# Patient Record
Sex: Female | Born: 1950 | Race: White | Hispanic: No | Marital: Married | Smoking: Former smoker
Health system: Southern US, Community
[De-identification: ages and names within clinical notes are randomized; demographics above are authoritative.]

## PROBLEM LIST (undated history)

## (undated) DIAGNOSIS — F419 Anxiety disorder, unspecified: Secondary | ICD-10-CM

## (undated) DIAGNOSIS — F329 Major depressive disorder, single episode, unspecified: Secondary | ICD-10-CM

## (undated) DIAGNOSIS — E785 Hyperlipidemia, unspecified: Secondary | ICD-10-CM

## (undated) DIAGNOSIS — K219 Gastro-esophageal reflux disease without esophagitis: Secondary | ICD-10-CM

## (undated) DIAGNOSIS — M1990 Unspecified osteoarthritis, unspecified site: Secondary | ICD-10-CM

## (undated) DIAGNOSIS — F32A Depression, unspecified: Secondary | ICD-10-CM

## (undated) HISTORY — DX: Major depressive disorder, single episode, unspecified: F32.9

## (undated) HISTORY — DX: Depression, unspecified: F32.A

## (undated) HISTORY — DX: Hyperlipidemia, unspecified: E78.5

## (undated) HISTORY — PX: APPENDECTOMY: SHX54

## (undated) HISTORY — PX: CATARACT EXTRACTION: SUR2

---

## 2015-11-18 ENCOUNTER — Telehealth: Payer: Self-pay | Admitting: Physician Assistant

## 2015-11-19 NOTE — Telephone Encounter (Signed)
yes

## 2015-11-19 NOTE — Telephone Encounter (Signed)
Spoke with patient and she went ahead and filled for the month. Patient scheduled an appointment for 12/06/15 @ 8:10am with Lawanna KobusAngel for medication refill.

## 2015-11-19 NOTE — Telephone Encounter (Signed)
Do you think we can do the prior authorization on her zolpidem?

## 2015-12-06 ENCOUNTER — Encounter: Payer: Self-pay | Admitting: Physician Assistant

## 2015-12-06 ENCOUNTER — Encounter (INDEPENDENT_AMBULATORY_CARE_PROVIDER_SITE_OTHER): Payer: Self-pay

## 2015-12-06 ENCOUNTER — Ambulatory Visit (INDEPENDENT_AMBULATORY_CARE_PROVIDER_SITE_OTHER): Payer: Medicare Other | Admitting: Physician Assistant

## 2015-12-06 VITALS — BP 116/70 | HR 83 | Temp 97.6°F | Ht 61.5 in | Wt 194.0 lb

## 2015-12-06 DIAGNOSIS — IMO0001 Reserved for inherently not codable concepts without codable children: Secondary | ICD-10-CM

## 2015-12-06 DIAGNOSIS — F329 Major depressive disorder, single episode, unspecified: Secondary | ICD-10-CM

## 2015-12-06 DIAGNOSIS — M9901 Segmental and somatic dysfunction of cervical region: Secondary | ICD-10-CM

## 2015-12-06 DIAGNOSIS — Z6836 Body mass index (BMI) 36.0-36.9, adult: Secondary | ICD-10-CM | POA: Diagnosis not present

## 2015-12-06 DIAGNOSIS — E785 Hyperlipidemia, unspecified: Secondary | ICD-10-CM | POA: Diagnosis not present

## 2015-12-06 DIAGNOSIS — G4700 Insomnia, unspecified: Secondary | ICD-10-CM

## 2015-12-06 DIAGNOSIS — Z23 Encounter for immunization: Secondary | ICD-10-CM

## 2015-12-06 DIAGNOSIS — E669 Obesity, unspecified: Secondary | ICD-10-CM | POA: Insufficient documentation

## 2015-12-06 DIAGNOSIS — F32A Depression, unspecified: Secondary | ICD-10-CM

## 2015-12-06 MED ORDER — ATORVASTATIN CALCIUM 40 MG PO TABS: 40.0000 mg | ORAL_TABLET | Freq: Every day | ORAL | 11 refills | Status: DC

## 2015-12-06 MED ORDER — ZOLPIDEM TARTRATE 10 MG PO TABS: 10.0000 mg | ORAL_TABLET | Freq: Every evening | ORAL | 5 refills | Status: DC | PRN

## 2015-12-06 MED ORDER — PHENTERMINE HCL 37.5 MG PO CAPS: 37.5000 mg | ORAL_CAPSULE | ORAL | 1 refills | Status: DC

## 2015-12-06 MED ORDER — VENLAFAXINE HCL ER 75 MG PO CP24: 75.0000 mg | ORAL_CAPSULE | Freq: Every day | ORAL | 11 refills | Status: DC

## 2015-12-06 NOTE — Patient Instructions (Signed)
Cervical Sprain  A cervical sprain is an injury in the neck in which the strong, fibrous tissues (ligaments) that connect your neck bones stretch or tear. Cervical sprains can range from mild to severe. Severe cervical sprains can cause the neck vertebrae to be unstable. This can lead to damage of the spinal cord and can result in serious nervous system problems. The amount of time it takes for a cervical sprain to get better depends on the cause and extent of the injury. Most cervical sprains heal in 1 to 3 weeks.  CAUSES   Severe cervical sprains may be caused by:    Contact sport injuries (such as from football, rugby, wrestling, hockey, auto racing, gymnastics, diving, martial arts, or boxing).    Motor vehicle collisions.    Whiplash injuries. This is an injury from a sudden forward and backward whipping movement of the head and neck.   Falls.   Mild cervical sprains may be caused by:    Being in an awkward position, such as while cradling a telephone between your ear and shoulder.    Sitting in a chair that does not offer proper support.    Working at a poorly designed computer station.    Looking up or down for long periods of time.   SYMPTOMS    Pain, soreness, stiffness, or a burning sensation in the front, back, or sides of the neck. This discomfort may develop immediately after the injury or slowly, 24 hours or more after the injury.    Pain or tenderness directly in the middle of the back of the neck.    Shoulder or upper back pain.    Limited ability to move the neck.    Headache.    Dizziness.    Weakness, numbness, or tingling in the hands or arms.    Muscle spasms.    Difficulty swallowing or chewing.    Tenderness and swelling of the neck.   DIAGNOSIS   Most of the time your health care provider can diagnose a cervical sprain by taking your history and doing a physical exam. Your health care provider will ask about previous neck injuries and any known neck  problems, such as arthritis in the neck. X-rays may be taken to find out if there are any other problems, such as with the bones of the neck. Other tests, such as a CT scan or MRI, may also be needed.   TREATMENT   Treatment depends on the severity of the cervical sprain. Mild sprains can be treated with rest, keeping the neck in place (immobilization), and pain medicines. Severe cervical sprains are immediately immobilized. Further treatment is done to help with pain, muscle spasms, and other symptoms and may include:   Medicines, such as pain relievers, numbing medicines, or muscle relaxants.    Physical therapy. This may involve stretching exercises, strengthening exercises, and posture training. Exercises and improved posture can help stabilize the neck, strengthen muscles, and help stop symptoms from returning.   HOME CARE INSTRUCTIONS    Put ice on the injured area.     Put ice in a plastic bag.     Place a towel between your skin and the bag.     Leave the ice on for 15-20 minutes, 3-4 times a day.    If your injury was severe, you may have been given a cervical collar to wear. A cervical collar is a two-piece collar designed to keep your neck from moving while it heals.      Do not remove the collar unless instructed by your health care provider.    If you have long hair, keep it outside of the collar.    Ask your health care provider before making any adjustments to your collar. Minor adjustments may be required over time to improve comfort and reduce pressure on your chin or on the back of your head.    Ifyou are allowed to remove the collar for cleaning or bathing, follow your health care provider's instructions on how to do so safely.    Keep your collar clean by wiping it with mild soap and water and drying it completely. If the collar you have been given includes removable pads, remove them every 1-2 days and hand wash them with soap and water. Allow them to air dry. They should be completely  dry before you wear them in the collar.    If you are allowed to remove the collar for cleaning and bathing, wash and dry the skin of your neck. Check your skin for irritation or sores. If you see any, tell your health care provider.    Do not drive while wearing the collar.    Only take over-the-counter or prescription medicines for pain, discomfort, or fever as directed by your health care provider.    Keep all follow-up appointments as directed by your health care provider.    Keep all physical therapy appointments as directed by your health care provider.    Make any needed adjustments to your workstation to promote good posture.    Avoid positions and activities that make your symptoms worse.    Warm up and stretch before being active to help prevent problems.   SEEK MEDICAL CARE IF:    Your pain is not controlled with medicine.    You are unable to decrease your pain medicine over time as planned.    Your activity level is not improving as expected.   SEEK IMMEDIATE MEDICAL CARE IF:    You develop any bleeding.   You develop stomach upset.   You have signs of an allergic reaction to your medicine.    Your symptoms get worse.    You develop new, unexplained symptoms.    You have numbness, tingling, weakness, or paralysis in any part of your body.   MAKE SURE YOU:    Understand these instructions.   Will watch your condition.   Will get help right away if you are not doing well or get worse.     This information is not intended to replace advice given to you by your health care provider. Make sure you discuss any questions you have with your health care provider.     Document Released: 01/08/2007 Document Revised: 03/18/2013 Document Reviewed: 09/18/2012  Elsevier Interactive Patient Education 2016 Elsevier Inc.

## 2015-12-06 NOTE — Progress Notes (Signed)
BP 116/70 (BP Location: Left Arm, Patient Position: Sitting, Cuff Size: Large)   Pulse 83   Temp 97.6 F (36.4 C) (Oral)   Ht 5' 1.5" (1.562 m)   Wt 194 lb (88 kg)   BMI 36.06 kg/m    Subjective:    Patient ID: Sabrina Stein, female    DOB: [birth year], 65 y.o.   MRN: [id number]  Sabrina Stein is a 65 y.o. female presenting on 12/06/2015 for Follow-up (Establish here )   HPI Patient here to be established as new patient at [facility].  This patient is known to me from [facility]. This patient comes in today to discuss her medical problems and her medications. Her medical history is positive for hyperlipidemia, insomnia, depression. She also often has cervical somatic dysfunction after doing certain work. Over the year she worked with her head down. She notes that after she does any type of activity with her neck down she'll have a lot of muscle tightness for days to come. I've given her information about stretching after she uses heat. In addition she can continue use of ibuprofen that she has been using for this. She is also in need of labs at this time. She is also in need of her pneumococcal vaccine up-to-date.  Relevant past medical, surgical, family and social history reviewed and updated as indicated. Interim medical history since our last visit reviewed. Allergies and medications reviewed and updated.   Data reviewed from any sources in EPIC.  Review of Systems  Constitutional: Negative.  Negative for activity change, fatigue and fever.  HENT: Negative.   Eyes: Negative.   Respiratory: Negative.  Negative for cough.   Cardiovascular: Negative.  Negative for chest pain.  Gastrointestinal: Negative.  Negative for abdominal pain.  Endocrine: Negative.   Genitourinary: Negative.  Negative for dysuria.  Musculoskeletal: Positive for neck pain and neck stiffness.  Skin: Negative.   Neurological: Negative.     Per HPI unless specifically  indicated above  Social History   Social History  . Marital status: Married    Spouse name: N/A  . Number of children: N/A  . Years of education: N/A   Occupational History  . Not on file.   Social History Main Topics  . Smoking status: Former Research scientist (life sciences)  . Smokeless tobacco: Never Used  . Alcohol use Yes     Comment: occ  . Drug use: No  . Sexual activity: Not on file   Other Topics Concern  . Not on file   Social History Narrative  . No narrative on file    Past Surgical History:  Procedure Laterality Date  . APPENDECTOMY    . CESAREAN SECTION      Family History  Problem Relation Age of Onset  . Cancer Father     lung      Medication List       Accurate as of 12/06/15  9:33 AM. Always use your most recent med list.          atorvastatin 40 MG tablet Commonly known as:  LIPITOR Take 1 tablet (40 mg total) by mouth daily.   phentermine 37.5 MG capsule Take 1 capsule (37.5 mg total) by mouth every morning.   venlafaxine XR 75 MG 24 hr capsule Commonly known as:  EFFEXOR-XR Take 1 capsule (75 mg total) by mouth daily.   zolpidem 10 MG tablet Commonly known as:  AMBIEN Take 1 tablet (10 mg total) by mouth at bedtime  as needed.          Objective:    BP 116/70 (BP Location: Left Arm, Patient Position: Sitting, Cuff Size: Large)   Pulse 83   Temp 97.6 F (36.4 C) (Oral)   Ht 5' 1.5" (1.562 m)   Wt 194 lb (88 kg)   BMI 36.06 kg/m   Allergies  Allergen Reactions  . Codeine Nausea And Vomiting   Wt Readings from Last 3 Encounters:  12/06/15 194 lb (88 kg)    Physical Exam  Constitutional: She is oriented to person, place, and time. She appears well-developed and well-nourished.  HENT:  Head: Normocephalic and atraumatic.  Eyes: Conjunctivae and EOM are normal. Pupils are equal, round, and reactive to light.  Neck: Normal range of motion. Neck supple.  Cardiovascular: Normal rate, regular rhythm, normal heart sounds and intact distal  pulses.   Pulmonary/Chest: Effort normal and breath sounds normal.  Abdominal: Soft. Bowel sounds are normal.  Musculoskeletal:       Cervical back: She exhibits tenderness and spasm.  Neurological: She is alert and oriented to person, place, and time. She has normal reflexes.  Skin: Skin is warm and dry. No rash noted.  Psychiatric: She has a normal mood and affect. Her behavior is normal. Judgment and thought content normal.  Nursing note and vitals reviewed.   No results found for this or any previous visit.    Assessment & Plan:   1. Hyperlipidemia Atorvastatin 40 mg one daily - Lipid panel  2. Cervical somatic dysfunction Heat and stretching  3. Insomnia Zolpidem 63m 1 QHS  4. Depression Venlafaxine 75 mg 2 daily  5. Well adult Not performed, but well labs are performed today - CBC with Differential/Platelet - Lipid panel - TSH - CMP14+EGFR  6. Need for pneumococcal vaccine - Pneumococcal conjugate vaccine 13-valent   Continue all other maintenance medications as listed above.  Follow up plan: Return in about 6 months (around 06/04/2016).  ATerald SleeperPA-C [place] [place] [phone]  12/06/2015, 9:33 AM

## 2015-12-07 LAB — TSH: TSH: 1.39 u[IU]/mL (ref 0.450–4.500)

## 2015-12-07 LAB — CMP14+EGFR
A/G RATIO: 1.4 (ref 1.2–2.2)
ALBUMIN: 4.1 g/dL (ref 3.6–4.8)
ALT: 19 IU/L (ref 0–32)
AST: 16 IU/L (ref 0–40)
Alkaline Phosphatase: 119 IU/L — ABNORMAL HIGH (ref 39–117)
BILIRUBIN TOTAL: 0.3 mg/dL (ref 0.0–1.2)
BUN / CREAT RATIO: 13 (ref 12–28)
BUN: 12 mg/dL (ref 8–27)
CALCIUM: 9.4 mg/dL (ref 8.7–10.3)
CHLORIDE: 102 mmol/L (ref 96–106)
CO2: 26 mmol/L (ref 18–29)
Creatinine, Ser: 0.9 mg/dL (ref 0.57–1.00)
GFR, EST AFRICAN AMERICAN: 78 mL/min/{1.73_m2} (ref >59)
GFR, EST NON AFRICAN AMERICAN: 67 mL/min/{1.73_m2} (ref >59)
Globulin, Total: 2.9 g/dL (ref 1.5–4.5)
Glucose: 111 mg/dL — ABNORMAL HIGH (ref 65–99)
POTASSIUM: 4.1 mmol/L (ref 3.5–5.2)
Sodium: 141 mmol/L (ref 134–144)
TOTAL PROTEIN: 7 g/dL (ref 6.0–8.5)

## 2015-12-07 LAB — LIPID PANEL
CHOLESTEROL TOTAL: 235 mg/dL — AB (ref 100–199)
Chol/HDL Ratio: 3.7 ratio units (ref 0.0–4.4)
HDL: 63 mg/dL (ref >39)
LDL CALC: 124 mg/dL — AB (ref 0–99)
TRIGLYCERIDES: 242 mg/dL — AB (ref 0–149)
VLDL CHOLESTEROL CAL: 48 mg/dL — AB (ref 5–40)

## 2015-12-07 LAB — CBC WITH DIFFERENTIAL/PLATELET
BASOS ABS: 0.1 10*3/uL (ref 0.0–0.2)
Basos: 1 %
EOS (ABSOLUTE): 0.1 10*3/uL (ref 0.0–0.4)
Eos: 1 %
HEMOGLOBIN: 13.3 g/dL (ref 11.1–15.9)
Hematocrit: 39.9 % (ref 34.0–46.6)
IMMATURE GRANS (ABS): 0 10*3/uL (ref 0.0–0.1)
IMMATURE GRANULOCYTES: 0 %
LYMPHS: 29 %
Lymphocytes Absolute: 1.8 10*3/uL (ref 0.7–3.1)
MCH: 30.6 pg (ref 26.6–33.0)
MCHC: 33.3 g/dL (ref 31.5–35.7)
MCV: 92 fL (ref 79–97)
MONOCYTES: 6 %
Monocytes Absolute: 0.4 10*3/uL (ref 0.1–0.9)
NEUTROS ABS: 3.9 10*3/uL (ref 1.4–7.0)
NEUTROS PCT: 63 %
Platelets: 360 10*3/uL (ref 150–379)
RBC: 4.35 x10E6/uL (ref 3.77–5.28)
RDW: 13.6 % (ref 12.3–15.4)
WBC: 6.3 10*3/uL (ref 3.4–10.8)

## 2015-12-13 ENCOUNTER — Encounter: Payer: Self-pay | Admitting: Physician Assistant

## 2015-12-20 ENCOUNTER — Ambulatory Visit (INDEPENDENT_AMBULATORY_CARE_PROVIDER_SITE_OTHER): Payer: Medicare Other

## 2015-12-20 DIAGNOSIS — Z23 Encounter for immunization: Secondary | ICD-10-CM | POA: Diagnosis not present

## 2015-12-21 ENCOUNTER — Encounter: Payer: Self-pay | Admitting: Physician Assistant

## 2015-12-24 ENCOUNTER — Telehealth: Payer: Self-pay

## 2015-12-24 NOTE — Telephone Encounter (Signed)
Let patient know, and we can send one of these in if she would like to try them.

## 2015-12-27 ENCOUNTER — Encounter: Payer: Self-pay | Admitting: Physician Assistant

## 2015-12-28 ENCOUNTER — Telehealth: Payer: Self-pay | Admitting: Physician Assistant

## 2015-12-28 ENCOUNTER — Encounter: Payer: Self-pay | Admitting: Physician Assistant

## 2015-12-28 MED ORDER — SUVOREXANT 10 MG PO TABS: 10.0000 mg | ORAL_TABLET | Freq: Every day | ORAL | 0 refills | Status: DC

## 2015-12-28 MED ORDER — SUVOREXANT 20 MG PO TABS: 20.0000 mg | ORAL_TABLET | Freq: Every day | ORAL | 0 refills | Status: DC

## 2015-12-28 MED ORDER — SUVOREXANT 15 MG PO TABS
15.0000 mg | ORAL_TABLET | Freq: Every day | ORAL | 0 refills | Status: DC
Start: 2015-12-28 — End: 2016-01-14

## 2015-12-28 NOTE — Telephone Encounter (Signed)
3 scripts for Belsomra 10 mg, 15 mg and 20mg  printed for patient to try.

## 2016-01-06 ENCOUNTER — Other Ambulatory Visit: Payer: Self-pay

## 2016-01-06 MED ORDER — VENLAFAXINE HCL ER 75 MG PO CP24: 75.0000 mg | ORAL_CAPSULE | Freq: Every day | ORAL | 1 refills | Status: DC

## 2016-01-14 ENCOUNTER — Other Ambulatory Visit: Payer: Self-pay | Admitting: Physician Assistant

## 2016-01-17 NOTE — Telephone Encounter (Signed)
Please call in

## 2016-01-18 NOTE — Telephone Encounter (Signed)
Refill called to [facility] 

## 2016-01-24 ENCOUNTER — Other Ambulatory Visit: Payer: Self-pay | Admitting: *Deleted

## 2016-01-24 MED ORDER — VENLAFAXINE HCL ER 75 MG PO CP24: 75.0000 mg | ORAL_CAPSULE | Freq: Every day | ORAL | 0 refills | Status: DC

## 2016-01-28 ENCOUNTER — Other Ambulatory Visit: Payer: Self-pay

## 2016-01-28 MED ORDER — VENLAFAXINE HCL ER 75 MG PO CP24: 75.0000 mg | ORAL_CAPSULE | Freq: Every day | ORAL | 2 refills | Status: DC

## 2016-01-31 MED ORDER — VENLAFAXINE HCL ER 75 MG PO CP24: 75.0000 mg | ORAL_CAPSULE | Freq: Every day | ORAL | 0 refills | Status: DC

## 2016-01-31 NOTE — Addendum Note (Signed)
Addended by: Tamera PuntWRAY, WENDY S on: 01/31/2016 02:00 PM   Modules accepted: Orders

## 2016-04-19 ENCOUNTER — Encounter: Payer: Self-pay | Admitting: Physician Assistant

## 2016-04-19 MED ORDER — PHENTERMINE HCL 37.5 MG PO CAPS: 37.5000 mg | ORAL_CAPSULE | ORAL | 1 refills | Status: DC

## 2016-04-20 ENCOUNTER — Other Ambulatory Visit: Payer: Self-pay | Admitting: Physician Assistant

## 2016-06-05 ENCOUNTER — Encounter: Payer: Self-pay | Admitting: Physician Assistant

## 2016-06-05 ENCOUNTER — Ambulatory Visit (INDEPENDENT_AMBULATORY_CARE_PROVIDER_SITE_OTHER): Payer: Medicare Other | Admitting: Physician Assistant

## 2016-06-05 VITALS — BP 131/80 | HR 71 | Temp 98.4°F | Ht 61.5 in | Wt 188.4 lb

## 2016-06-05 DIAGNOSIS — R739 Hyperglycemia, unspecified: Secondary | ICD-10-CM | POA: Diagnosis not present

## 2016-06-05 DIAGNOSIS — E782 Mixed hyperlipidemia: Secondary | ICD-10-CM

## 2016-06-05 DIAGNOSIS — F5101 Primary insomnia: Secondary | ICD-10-CM | POA: Diagnosis not present

## 2016-06-05 LAB — BAYER DCA HB A1C WAIVED: HB A1C: 5.9 % (ref <7.0)

## 2016-06-05 MED ORDER — VENLAFAXINE HCL ER 75 MG PO CP24: 75.0000 mg | ORAL_CAPSULE | Freq: Every day | ORAL | 3 refills | Status: DC

## 2016-06-05 MED ORDER — SUVOREXANT 20 MG PO TABS: 20.0000 mg | ORAL_TABLET | Freq: Every day | ORAL | 5 refills | Status: DC

## 2016-06-05 MED ORDER — PHENTERMINE HCL 37.5 MG PO CAPS: 37.5000 mg | ORAL_CAPSULE | Freq: Every morning | ORAL | 5 refills | Status: DC

## 2016-06-05 NOTE — Patient Instructions (Signed)
Insomnia Insomnia is a sleep disorder that makes it difficult to fall asleep or to stay asleep. Insomnia can cause tiredness (fatigue), low energy, difficulty concentrating, mood swings, and poor performance at work or school. There are three different ways to classify insomnia:  Difficulty falling asleep.  Difficulty staying asleep.  Waking up too early in the morning. Any type of insomnia can be long-term (chronic) or short-term (acute). Both are common. Short-term insomnia usually lasts for three months or less. Chronic insomnia occurs at least three times a week for longer than three months. What are the causes? Insomnia may be caused by another condition, situation, or substance, such as:  Anxiety.  Certain medicines.  Gastroesophageal reflux disease (GERD) or other gastrointestinal conditions.  Asthma or other breathing conditions.  Restless legs syndrome, sleep apnea, or other sleep disorders.  Chronic pain.  Menopause. This may include hot flashes.  Stroke.  Abuse of alcohol, tobacco, or illegal drugs.  Depression.  Caffeine.  Neurological disorders, such as Alzheimer disease.  An overactive thyroid (hyperthyroidism). The cause of insomnia may not be known. What increases the risk? Risk factors for insomnia include:  Gender. Women are more commonly affected than men.  Age. Insomnia is more common as you get older.  Stress. This may involve your professional or personal life.  Income. Insomnia is more common in people with lower income.  Lack of exercise.  Irregular work schedule or night shifts.  Traveling between different time zones. What are the signs or symptoms? If you have insomnia, trouble falling asleep or trouble staying asleep is the main symptom. This may lead to other symptoms, such as:  Feeling fatigued.  Feeling nervous about going to sleep.  Not feeling rested in the morning.  Having trouble concentrating.  Feeling irritable,  anxious, or depressed. How is this treated? Treatment for insomnia depends on the cause. If your insomnia is caused by an underlying condition, treatment will focus on addressing the condition. Treatment may also include:  Medicines to help you sleep.  Counseling or therapy.  Lifestyle adjustments. Follow these instructions at home:  Take medicines only as directed by your health care provider.  Keep regular sleeping and waking hours. Avoid naps.  Keep a sleep diary to help you and your health care provider figure out what could be causing your insomnia. Include:  When you sleep.  When you wake up during the night.  How well you sleep.  How rested you feel the next day.  Any side effects of medicines you are taking.  What you eat and drink.  Make your bedroom a comfortable place where it is easy to fall asleep:  Put up shades or special blackout curtains to block light from outside.  Use a white noise machine to block noise.  Keep the temperature cool.  Exercise regularly as directed by your health care provider. Avoid exercising right before bedtime.  Use relaxation techniques to manage stress. Ask your health care provider to suggest some techniques that may work well for you. These may include:  Breathing exercises.  Routines to release muscle tension.  Visualizing peaceful scenes.  Cut back on alcohol, caffeinated beverages, and cigarettes, especially close to bedtime. These can disrupt your sleep.  Do not overeat or eat spicy foods right before bedtime. This can lead to digestive discomfort that can make it hard for you to sleep.  Limit screen use before bedtime. This includes:  Watching TV.  Using your smartphone, tablet, and computer.  Stick to a   routine. This can help you fall asleep faster. Try to do a quiet activity, brush your teeth, and go to bed at the same time each night.  Get out of bed if you are still awake after 15 minutes of trying to  sleep. Keep the lights down, but try reading or doing a quiet activity. When you feel sleepy, go back to bed.  Make sure that you drive carefully. Avoid driving if you feel very sleepy.  Keep all follow-up appointments as directed by your health care provider. This is important. Contact a health care provider if:  You are tired throughout the day or have trouble in your daily routine due to sleepiness.  You continue to have sleep problems or your sleep problems get worse. Get help right away if:  You have serious thoughts about hurting yourself or someone else. This information is not intended to replace advice given to you by your health care provider. Make sure you discuss any questions you have with your health care provider. Document Released: 03/10/2000 Document Revised: 08/13/2015 Document Reviewed: 12/12/2013 Elsevier Interactive Patient Education  2017 Elsevier Inc.  

## 2016-06-05 NOTE — Progress Notes (Signed)
BP 131/80   Pulse 71   Temp 98.4 F (36.9 C) (Oral)   Ht 5' 1.5" (1.562 m)   Wt 188 lb 6.4 oz (85.5 kg)   BMI 35.02 kg/m    Subjective:    Patient ID: Sabrina Stein, female    DOB: 12/09/1950, 66 y.o.   MRN: [id number]  HPI: Sabrina Stein is a 65 y.o. female presenting on 06/05/2016 for Follow-up (6 month ) and Hyperlipidemia  This patient comes in for periodic recheck on medications and conditions including insomnia, hyperlipidemia, depression, weight loss efforts. Reports that Belsomra is effective but not as good as ambien, but wants to continue with it at the 20 mg dose. Is down about 15 pounds since November, working very hard ondiet and exercise.   All medications are reviewed today. There are no reports of any problems with the medications. All of the medical conditions are reviewed and updated.  Lab work is reviewed and will be ordered as medically necessary. There are no new problems reported with today's visit.   Relevant past medical, surgical, family and social history reviewed and updated as indicated. Allergies and medications reviewed and updated.  Past Medical History:  Diagnosis Date  . Depression   . Hyperlipidemia     Past Surgical History:  Procedure Laterality Date  . APPENDECTOMY    . CESAREAN SECTION      Review of Systems  Constitutional: Negative.  Negative for activity change, fatigue and fever.  HENT: Negative.   Eyes: Negative.   Respiratory: Negative.  Negative for cough.   Cardiovascular: Negative.  Negative for chest pain.  Gastrointestinal: Negative.  Negative for abdominal pain.  Endocrine: Negative.   Genitourinary: Negative.  Negative for dysuria.  Musculoskeletal: Negative.   Skin: Negative.   Neurological: Negative.     Allergies as of 06/05/2016      Reactions   Codeine Nausea And Vomiting      Medication List       Accurate as of 06/05/16 10:28 AM. Always use your most recent med list.          atorvastatin 40 MG  tablet Commonly known as:  LIPITOR Take 1 tablet (40 mg total) by mouth daily.   phentermine 37.5 MG capsule Take 1 capsule (37.5 mg total) by mouth every morning.   Suvorexant 20 MG Tabs Commonly known as:  BELSOMRA Take 20 mg by mouth at bedtime.   venlafaxine XR 75 MG 24 hr capsule Commonly known as:  EFFEXOR-XR Take 1 capsule (75 mg total) by mouth daily.          Objective:    BP 131/80   Pulse 71   Temp 98.4 F (36.9 C) (Oral)   Ht 5' 1.5" (1.562 m)   Wt 188 lb 6.4 oz (85.5 kg)   BMI 35.02 kg/m   Allergies  Allergen Reactions  . Codeine Nausea And Vomiting    Physical Exam  Constitutional: She is oriented to person, place, and time. She appears well-developed and well-nourished.  HENT:  Head: Normocephalic and atraumatic.  Right Ear: Tympanic membrane, external ear and ear canal normal.  Left Ear: Tympanic membrane, external ear and ear canal normal.  Nose: Nose normal. No rhinorrhea.  Mouth/Throat: Oropharynx is clear and moist and mucous membranes are normal. No oropharyngeal exudate or posterior oropharyngeal erythema.  Eyes: Conjunctivae and EOM are normal. Pupils are equal, round, and reactive to light.  Neck: Normal range of motion. Neck supple.  Cardiovascular: Normal rate, regular  rhythm, normal heart sounds and intact distal pulses.   Pulmonary/Chest: Effort normal and breath sounds normal.  Abdominal: Soft. Bowel sounds are normal.  Neurological: She is alert and oriented to person, place, and time. She has normal reflexes.  Skin: Skin is warm and dry. No rash noted.  Psychiatric: She has a normal mood and affect. Her behavior is normal. Judgment and thought content normal.        Assessment & Plan:   1. Mixed hyperlipidemia - Lipid Panel - CMP14+EGFR  2. Hyperglycemia - CMP14+EGFR - Bayer DCA Hb A1c Waived  3. Primary insomnia - Suvorexant (BELSOMRA) 20 MG TABS; Take 20 mg by mouth at bedtime.  Dispense: 30 tablet; Refill:  5   Current Outpatient Prescriptions:  .  atorvastatin (LIPITOR) 40 MG tablet, Take 1 tablet (40 mg total) by mouth daily., Disp: 30 tablet, Rfl: 11 .  phentermine 37.5 MG capsule, Take 1 capsule (37.5 mg total) by mouth every morning., Disp: 30 capsule, Rfl: 5 .  Suvorexant (BELSOMRA) 20 MG TABS, Take 20 mg by mouth at bedtime., Disp: 30 tablet, Rfl: 5 .  venlafaxine XR (EFFEXOR-XR) 75 MG 24 hr capsule, Take 1 capsule (75 mg total) by mouth daily., Disp: 90 capsule, Rfl: 3  Continue all other maintenance medications as listed above.  Follow up plan: Return in about 6 months (around 12/06/2016) for recheck.  Educational handout given for insomnia  Terald Sleeper PA-C [place] [address]  [place], [place] [address] [phone]   06/05/2016, 10:28 AM

## 2016-06-06 LAB — CMP14+EGFR
ALT: 24 IU/L (ref 0–32)
AST: 24 IU/L (ref 0–40)
Albumin/Globulin Ratio: 1.9 (ref 1.2–2.2)
Albumin: 4.2 g/dL (ref 3.6–4.8)
Alkaline Phosphatase: 112 IU/L (ref 39–117)
BUN/Creatinine Ratio: 12 (ref 12–28)
BUN: 11 mg/dL (ref 8–27)
Bilirubin Total: 0.3 mg/dL (ref 0.0–1.2)
CALCIUM: 8.8 mg/dL (ref 8.7–10.3)
CO2: 24 mmol/L (ref 18–29)
CREATININE: 0.89 mg/dL (ref 0.57–1.00)
Chloride: 101 mmol/L (ref 96–106)
GFR calc non Af Amer: 68 mL/min/{1.73_m2} (ref >59)
GFR, EST AFRICAN AMERICAN: 79 mL/min/{1.73_m2} (ref >59)
GLUCOSE: 100 mg/dL — AB (ref 65–99)
Globulin, Total: 2.2 g/dL (ref 1.5–4.5)
Potassium: 4.1 mmol/L (ref 3.5–5.2)
Sodium: 140 mmol/L (ref 134–144)
TOTAL PROTEIN: 6.4 g/dL (ref 6.0–8.5)

## 2016-06-06 LAB — LIPID PANEL
CHOL/HDL RATIO: 2.3 ratio (ref 0.0–4.4)
Cholesterol, Total: 136 mg/dL (ref 100–199)
HDL: 58 mg/dL (ref >39)
LDL Calculated: 56 mg/dL (ref 0–99)
TRIGLYCERIDES: 109 mg/dL (ref 0–149)
VLDL Cholesterol Cal: 22 mg/dL (ref 5–40)

## 2016-06-22 ENCOUNTER — Encounter: Payer: Self-pay | Admitting: Physician Assistant

## 2016-06-22 ENCOUNTER — Other Ambulatory Visit: Payer: Self-pay | Admitting: Physician Assistant

## 2016-06-26 ENCOUNTER — Telehealth: Payer: Self-pay | Admitting: *Deleted

## 2016-06-26 ENCOUNTER — Encounter: Payer: Self-pay | Admitting: Physician Assistant

## 2016-06-26 NOTE — Telephone Encounter (Signed)
Phentermine was approved at 3/12 visit, set on Phone In. TC [facility] this was not received by them Gave verbal Rx today

## 2016-12-13 ENCOUNTER — Other Ambulatory Visit: Payer: Self-pay | Admitting: Physician Assistant

## 2017-01-07 ENCOUNTER — Other Ambulatory Visit: Payer: Self-pay | Admitting: Physician Assistant

## 2017-01-07 DIAGNOSIS — F5101 Primary insomnia: Secondary | ICD-10-CM

## 2017-01-08 NOTE — Telephone Encounter (Signed)
Called both meds in 

## 2017-01-16 ENCOUNTER — Ambulatory Visit (INDEPENDENT_AMBULATORY_CARE_PROVIDER_SITE_OTHER): Payer: Medicare Other | Admitting: Physician Assistant

## 2017-01-16 ENCOUNTER — Encounter: Payer: Self-pay | Admitting: Physician Assistant

## 2017-01-16 VITALS — BP 134/78 | HR 77 | Temp 97.0°F | Ht 61.5 in | Wt 194.8 lb

## 2017-01-16 DIAGNOSIS — F5101 Primary insomnia: Secondary | ICD-10-CM

## 2017-01-16 DIAGNOSIS — E782 Mixed hyperlipidemia: Secondary | ICD-10-CM | POA: Diagnosis not present

## 2017-01-16 DIAGNOSIS — Z23 Encounter for immunization: Secondary | ICD-10-CM | POA: Diagnosis not present

## 2017-01-16 MED ORDER — PHENTERMINE HCL 37.5 MG PO CAPS: 37.5000 mg | ORAL_CAPSULE | Freq: Every day | ORAL | 2 refills | Status: DC

## 2017-01-16 MED ORDER — SUVOREXANT 20 MG PO TABS: 1.0000 | ORAL_TABLET | Freq: Every day | ORAL | 5 refills | Status: DC

## 2017-01-16 MED ORDER — VENLAFAXINE HCL ER 75 MG PO CP24: 75.0000 mg | ORAL_CAPSULE | Freq: Every day | ORAL | 3 refills | Status: DC

## 2017-01-16 MED ORDER — ATORVASTATIN CALCIUM 20 MG PO TABS: 20.0000 mg | ORAL_TABLET | Freq: Every day | ORAL | 5 refills | Status: DC

## 2017-01-16 NOTE — Progress Notes (Signed)
BP 134/78   Pulse 77   Temp (!) 97 F (36.1 C) (Oral)   Ht 5' 1.5" (1.562 m)   Wt 194 lb 12.8 oz (88.4 kg)   BMI 36.21 kg/m    Subjective:    Patient ID: Sabrina Stein, female    DOB: 05-18-1950, 66 y.o.   MRN: [id number]  HPI: Kamila Broda is a 66 y.o. female presenting on 01/16/2017 for Follow-up (3 month)  Patient comes in today follow-up of her insomnia, hyperlipidemia.  She did have a slightly elevated glucose and previous readings and her A1c was at the top of normal.  We did have a long talk about reducing carbohydrates in her diet.  She will come back and have labs drawn again in 3 months to check the A1c.  Otherwise she is feeling good with her current medications.  She states that the Belsomra does not work as well as the [place].  Her insurance however is not paying for Ambien at all.  Relevant past medical, surgical, family and social history reviewed and updated as indicated. Allergies and medications reviewed and updated.  Past Medical History:  Diagnosis Date  . Depression   . Hyperlipidemia     Past Surgical History:  Procedure Laterality Date  . APPENDECTOMY    . CESAREAN SECTION      Review of Systems  Constitutional: Negative.  Negative for activity change, fatigue and fever.  HENT: Negative.   Eyes: Negative.   Respiratory: Negative.  Negative for cough.   Cardiovascular: Negative.  Negative for chest pain.  Gastrointestinal: Negative.  Negative for abdominal pain.  Endocrine: Negative.   Genitourinary: Negative.  Negative for dysuria.  Musculoskeletal: Negative.   Skin: Negative.   Neurological: Negative.     Allergies as of 01/16/2017      Reactions   Codeine Nausea And Vomiting      Medication List       Accurate as of 01/16/17  1:51 PM. Always use your most recent med list.          atorvastatin 20 MG tablet Commonly known as:  LIPITOR Take 1 tablet (20 mg total) by mouth daily.   phentermine 37.5 MG capsule Take 1 capsule (37.5  mg total) by mouth daily.   Suvorexant 20 MG Tabs Commonly known as:  BELSOMRA Take 1 tablet by mouth at bedtime.   venlafaxine XR 75 MG 24 hr capsule Commonly known as:  EFFEXOR-XR Take 1 capsule (75 mg total) by mouth daily.          Objective:    BP 134/78   Pulse 77   Temp (!) 97 F (36.1 C) (Oral)   Ht 5' 1.5" (1.562 m)   Wt 194 lb 12.8 oz (88.4 kg)   BMI 36.21 kg/m   Allergies  Allergen Reactions  . Codeine Nausea And Vomiting    Physical Exam  Constitutional: She is oriented to person, place, and time. She appears well-developed and well-nourished.  HENT:  Head: Normocephalic and atraumatic.  Eyes: Pupils are equal, round, and reactive to light. Conjunctivae are normal.  Cardiovascular: Normal rate, regular rhythm, normal heart sounds and intact distal pulses.   Pulmonary/Chest: Effort normal and breath sounds normal.  Abdominal: Soft. Bowel sounds are normal. She exhibits no distension and no mass. There is tenderness in the suprapubic area. There is no rebound, no guarding and no CVA tenderness.  Neurological: She is alert and oriented to person, place, and time. She has normal reflexes.  Skin: Skin is warm and dry. No rash noted.  Psychiatric: She has a normal mood and affect. Her behavior is normal. Judgment and thought content normal.  Nursing note and vitals reviewed.   Results for orders placed or performed in visit on 06/05/16  Lipid Panel  Result Value Ref Range   Cholesterol, Total 136 100 - 199 mg/dL   Triglycerides 109 0 - 149 mg/dL   HDL 58 >39 mg/dL   VLDL Cholesterol Cal 22 5 - 40 mg/dL   LDL Calculated 56 0 - 99 mg/dL   Chol/HDL Ratio 2.3 0.0 - 4.4 ratio units  CMP14+EGFR  Result Value Ref Range   Glucose 100 (H) 65 - 99 mg/dL   BUN 11 8 - 27 mg/dL   Creatinine, Ser 0.89 0.57 - 1.00 mg/dL   GFR calc non Af Amer 68 >59 mL/min/1.73   GFR calc Af Amer 79 >59 mL/min/1.73   BUN/Creatinine Ratio 12 12 - 28   Sodium 140 134 - 144 mmol/L    Potassium 4.1 3.5 - 5.2 mmol/L   Chloride 101 96 - 106 mmol/L   CO2 24 18 - 29 mmol/L   Calcium 8.8 8.7 - 10.3 mg/dL   Total Protein 6.4 6.0 - 8.5 g/dL   Albumin 4.2 3.6 - 4.8 g/dL   Globulin, Total 2.2 1.5 - 4.5 g/dL   Albumin/Globulin Ratio 1.9 1.2 - 2.2   Bilirubin Total 0.3 0.0 - 1.2 mg/dL   Alkaline Phosphatase 112 39 - 117 IU/L   AST 24 0 - 40 IU/L   ALT 24 0 - 32 IU/L  Bayer DCA Hb A1c Waived  Result Value Ref Range   Bayer DCA Hb A1c Waived 5.9 <7.0 %      Assessment & Plan:   1. Need for immunization against influenza - Flu Vaccine QUAD 36+ mos IM  2. Primary insomnia - Suvorexant (BELSOMRA) 20 MG TABS; Take 1 tablet by mouth at bedtime.  Dispense: 30 tablet; Refill: 5  3. Mixed hyperlipidemia - atorvastatin (LIPITOR) 20 MG tablet; Take 1 tablet (20 mg total) by mouth daily.  Dispense: 30 tablet; Refill: 5    Current Outpatient Prescriptions:  .  atorvastatin (LIPITOR) 20 MG tablet, Take 1 tablet (20 mg total) by mouth daily., Disp: 30 tablet, Rfl: 5 .  phentermine 37.5 MG capsule, Take 1 capsule (37.5 mg total) by mouth daily., Disp: 30 capsule, Rfl: 2 .  Suvorexant (BELSOMRA) 20 MG TABS, Take 1 tablet by mouth at bedtime., Disp: 30 tablet, Rfl: 5 .  venlafaxine XR (EFFEXOR-XR) 75 MG 24 hr capsule, Take 1 capsule (75 mg total) by mouth daily., Disp: 90 capsule, Rfl: 3 Continue all other maintenance medications as listed above.  Follow up plan: Return in about 6 months (around 07/17/2017) for recheck.  Educational handout given for [place] PA-C [facility] [address]  [place], [place] [address] [phone]   01/16/2017, 1:51 PM

## 2017-01-16 NOTE — Patient Instructions (Signed)
In a few days you may receive a survey in the mail or online from [employer] regarding your visit with us today. Please take a moment to fill this out. Your feedback is very important to our whole office. It can help us better understand your needs as well as improve your experience and satisfaction. Thank you for taking your time to complete it. We care about you.  Caidyn Blossom, PA-C  

## 2017-01-25 ENCOUNTER — Ambulatory Visit (INDEPENDENT_AMBULATORY_CARE_PROVIDER_SITE_OTHER): Payer: Medicare Other | Admitting: *Deleted

## 2017-01-25 ENCOUNTER — Encounter: Payer: Self-pay | Admitting: *Deleted

## 2017-01-25 VITALS — BP 129/71 | HR 74 | Ht 62.0 in | Wt 196.0 lb

## 2017-01-25 DIAGNOSIS — Z0000 Encounter for general adult medical examination without abnormal findings: Secondary | ICD-10-CM

## 2017-01-25 DIAGNOSIS — Z23 Encounter for immunization: Secondary | ICD-10-CM

## 2017-01-25 NOTE — Patient Instructions (Signed)
Ms. Sophronia SimasBurris , Thank you for taking time to come for your Medicare Wellness Visit. I appreciate your ongoing commitment to your health goals. Please review the following plan we discussed and let me know if I can assist you in the future.   These are the goals we discussed: Goals    . Exercise 150 minutes per week (moderate activity)       This is a list of the screening recommended for you and due dates:  Health Maintenance  Topic Date Due  .  Hepatitis C: One time screening is recommended by Center for Disease Control  (CDC) for  adults born from [birth year] through 1965.   Jul 12, 1950  . Complete foot exam   10/12/1960  . Eye exam for diabetics  10/12/1960  . Urine Protein Check  10/12/1960  . Mammogram  10/12/2000  . Colon Cancer Screening  10/12/2000  . DEXA scan (bone density measurement)  10/13/2015  . Pneumonia vaccines (2 of 2 - PPSV23) 12/05/2016  . Hemoglobin A1C  12/06/2016  . Tetanus Vaccine  11/13/2021  . Flu Shot  Completed    Recombinant Zoster (Shingles) Vaccine, RZV: What You Need to Know 1. Why get vaccinated? Shingles (also called herpes zoster, or just zoster) is a painful skin rash, often with blisters. Shingles is caused by the varicella zoster virus, the same virus that causes chickenpox. After you have chickenpox, the virus stays in your body and can cause shingles later in life. You can't catch shingles from another person. However, a person who has never had chickenpox (or chickenpox vaccine) could get chickenpox from someone with shingles. A shingles rash usually appears on one side of the face or body and heals within 2 to 4 weeks. Its main symptom is pain, which can be severe. Other symptoms can include fever, headache, chills and upset stomach. Very rarely, a shingles infection can lead to pneumonia, hearing problems, blindness, brain inflammation (encephalitis), or death. For about 1 person in 5, severe pain can continue even long after the rash has cleared up.  This long-lasting pain is called post-herpetic neuralgia (PHN). Shingles is far more common in people 66 years of age and older than in younger people, and the risk increases with age. It is also more common in people whose immune system is weakened because of a disease such as cancer, or by drugs such as steroids or chemotherapy. At least 1 million people a year in the [place] get shingles. 2. Shingles vaccine (recombinant) Recombinant shingles vaccine was approved by FDA in 2017 for the prevention of shingles. In clinical trials, it was more than 90% effective in preventing shingles. It can also reduce the likelihood of PHN. Two doses, 2 to 6 months apart, are recommended for adults 50 and older. This vaccine is also recommended for people who have already gotten the live shingles vaccine (Zostavax). There is no live virus in this vaccine. 3. Some people should not get this vaccine Tell your vaccine provider if you:  Have any severe, life-threatening allergies. A person who has ever had a life-threatening allergic reaction after a dose of recombinant shingles vaccine, or has a severe allergy to any component of this vaccine, may be advised not to be vaccinated. Ask your health care provider if you want information about vaccine components.  Are pregnant or breastfeeding. There is not much information about use of recombinant shingles vaccine in pregnant or nursing women. Your healthcare provider might recommend delaying vaccination.  Are not feeling well.  If you have a mild illness, such as a cold, you can probably get the vaccine today. If you are moderately or severely ill, you should probably wait until you recover. Your doctor can advise you.  4. Risks of a vaccine reaction With any medicine, including vaccines, there is a chance of reactions. After recombinant shingles vaccination, a person might experience:  Pain, redness, soreness, or swelling at the site of the  injection  Headache, muscle aches, fever, shivering, fatigue  In clinical trials, most people got a sore arm with mild or moderate pain after vaccination, and some also had redness and swelling where they got the shot. Some people felt tired, had muscle pain, a headache, shivering, fever, stomach pain, or nausea. About 1 out of 6 people who got recombinant zoster vaccine experienced side effects that prevented them from doing regular activities. Symptoms went away on their own in about 2 to 3 days. Side effects were more common in younger people. You should still get the second dose of recombinant zoster vaccine even if you had one of these reactions after the first dose. Other things that could happen after this vaccine:  People sometimes faint after medical procedures, including vaccination. Sitting or lying down for about 15 minutes can help prevent fainting and injuries caused by a fall. Tell your provider if you feel dizzy or have vision changes or ringing in the ears.  Some people get shoulder pain that can be more severe and longer-lasting than routine soreness that can follow injections. This happens very rarely.  Any medication can cause a severe allergic reaction. Such reactions to a vaccine are estimated at about 1 in a million doses, and would happen within a few minutes to a few hours after the vaccination. As with any medicine, there is a very remote chance of a vaccine causing a serious injury or death. The safety of vaccines is always being monitored. For more information, visit: http://floyd.org/ 5. What if there is a serious problem? What should I look for?  Look for anything that concerns you, such as signs of a severe allergic reaction, very high fever, or unusual behavior. Signs of a severe allergic reaction can include hives, swelling of the face and throat, difficulty breathing, a fast heartbeat, dizziness, and weakness. These would usually start a few minutes to a  few hours after the vaccination. What should I do?  If you think it is a severe allergic reaction or other emergency that can't wait, call 9-1-1 and get to the nearest hospital. Otherwise, call your health care provider. Afterward, the reaction should be reported to the Vaccine Adverse Event Reporting System (VAERS). Your doctor should file this report, or you can do it yourself through the VAERS web site atwww.vaers.https://coleman.net/ by calling 367-717-3346. VAERS does not give medical advice. 6. How can I learn more?  Ask your healthcare provider. He or she can give you the vaccine package insert or suggest other sources of information.  Call your local or state health department.  Contact the Centers for Disease Control and Prevention (CDC): ? Call 9036474051 (1-800-CDC-INFO) or ? Visit the CDC's website at PicCapture.uy CDC Vaccine Information Statement (VIS) Recombinant Zoster Vaccine (05/08/2016) This information is not intended to replace advice given to you by your health care provider. Make sure you discuss any questions you have with your health care provider. Document Released: 05/23/2016 Document Revised: 05/23/2016 Document Reviewed: 05/23/2016 Elsevier Interactive Patient Education  [employer].

## 2017-01-25 NOTE — Progress Notes (Signed)
Subjective:   Sabrina Stein is a 66 y.o. female who presents for an Initial Medicare Annual Wellness Visit. Sabrina Stein lives at home with her husband and two dogs. They have 2 adult children. 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   Review of Systems    Health is about the same as last year.   Cardiac Risk Factors include: advanced age (>45men, >65 women);dyslipidemia;sedentary lifestyle;obesity (BMI >30kg/m2)  Other systems negative.     Objective:    Today's Vitals   01/25/17 0907  BP: 129/71  Pulse: 74  Weight: 196 lb (88.9 kg)  Height: 5\' 2"  (1.575 m)   Body mass index is 35.85 kg/m.   Current Medications (verified) Outpatient Encounter Prescriptions as of 01/25/2017  Medication Sig  . atorvastatin (LIPITOR) 20 MG tablet Take 1 tablet (20 mg total) by mouth daily.  . phentermine 37.5 MG capsule Take 1 capsule (37.5 mg total) by mouth daily.  . Suvorexant (BELSOMRA) 20 MG TABS Take 1 tablet by mouth at bedtime.  Marland Kitchen venlafaxine XR (EFFEXOR-XR) 75 MG 24 hr capsule Take 1 capsule (75 mg total) by mouth daily.   No facility-administered encounter medications on file as of 01/25/2017.     Allergies (verified) Codeine   History: Past Medical History:  Diagnosis Date  . Depression   . Hyperlipidemia    Past Surgical History:  Procedure Laterality Date  . APPENDECTOMY    . CESAREAN SECTION     Family History  Problem Relation Age of Onset  . Cancer Father        lung  . Healthy Sister   . Healthy Daughter   . Healthy Son    Social History   Occupational History  . Not on file.   Social History Main Topics  . Smoking status: Former Smoker    Quit date: 01/26/2012  . Smokeless tobacco: Never Used  . Alcohol use Yes     Comment: occ  . Drug use: No  . Sexual activity: Yes    Tobacco Counseling No tobacco use  Activities of Daily Living In your present state of health, do you have any difficulty performing the following  activities: 01/25/2017  Hearing? N  Vision? N  Difficulty concentrating or making decisions? N  Walking or climbing stairs? N  Dressing or bathing? N  Doing errands, shopping? N  Preparing Food and eating ? N  Using the Toilet? N  In the past six months, have you accidently leaked urine? Y  Comment some difficulty at night when having to get up and run to the bathroom. Also some stress incontinence.   Do you have problems with loss of bowel control? N  Managing your Medications? N  Managing your Finances? N  Housekeeping or managing your Housekeeping? N  Some recent data might be hidden    Immunizations and Health Maintenance Immunization History  Administered Date(s) Administered  . Influenza, High Dose Seasonal PF 12/20/2015  . Influenza,inj,Quad PF,6+ Mos 01/16/2017  . Pneumococcal Conjugate-13 12/06/2015  . Td 11/14/2011   Health Maintenance Due  Topic Date Due  . Hepatitis C Screening  03/09/1951  . FOOT EXAM  10/12/1960  . OPHTHALMOLOGY EXAM  10/12/1960  . URINE MICROALBUMIN  10/12/1960  . MAMMOGRAM  10/12/2000  . COLONOSCOPY  10/12/2000  . DEXA SCAN  10/13/2015  . PNA vac Low Risk Adult (2 of 2 - PPSV23) 12/05/2016  . HEMOGLOBIN A1C  12/06/2016    Patient Care Team: Yetta Barre,  Redmond BasemanAngel S, PA-C as PCP - General (Physician Assistant)  No hospitalizations, ER visits, or surgeries this past year.      Assessment:   This is a routine wellness examination for Sabrina Stein.   Hearing/Vision screen No deficit noted during visit. Eye exam is overdue.   Dietary issues and exercise activities discussed: Current Exercise Habits: The patient does not participate in regular exercise at present, Exercise limited by: None identified  Goals    . Exercise 150 minutes per week (moderate activity)      Depression Screen PHQ 2/9 Scores 01/25/2017 01/16/2017 06/05/2016 12/06/2015  PHQ - 2 Score 0 0 0 1    Fall Risk Fall Risk  01/25/2017 01/16/2017 06/05/2016 12/06/2015  Falls in the past  year? No No No No    Cognitive Function:    MMSE - Mini Mental State Exam 01/25/2017  Orientation to time 5  Orientation to Place 5  Registration 3  Attention/ Calculation 5  Recall 3  Language- name 2 objects 2  Language- repeat 1  Language- follow 3 step command 3  Language- read & follow direction 1  Write a sentence 1  Copy design 1  Total score 30         Screening Tests Health Maintenance  Topic Date Due  . Hepatitis C Screening  1951-02-22  . FOOT EXAM  10/12/1960  . OPHTHALMOLOGY EXAM  10/12/1960  . URINE MICROALBUMIN  10/12/1960  . MAMMOGRAM  10/12/2000  . COLONOSCOPY  10/12/2000  . DEXA SCAN  10/13/2015  . PNA vac Low Risk Adult (2 of 2 - PPSV23) 12/05/2016  . HEMOGLOBIN A1C  12/06/2016  . TETANUS/TDAP  11/13/2021  . INFLUENZA VACCINE  Completed   Patient is not interested in colon cancer screening or mammogram.     Plan:  Shingrix given today. Will have next injection at f/u visit with PCP. Keep f/u with Prudy FeelerAngel Jones, PA-C in April .  Consider having a mammogram and colon cancer screening.  Exercise for at least 150 min a week.    I have personally reviewed and noted the following in the patient's chart:   . Medical and social history . Use of alcohol, tobacco or illicit drugs  . Current medications and supplements . Functional ability and status . Nutritional status . Physical activity . Advanced directives . List of other physicians . Hospitalizations, surgeries, and ER visits in previous 12 months . Vitals . Screenings to include cognitive, depression, and falls . Referrals and appointments  In addition, I have reviewed and discussed with patient certain preventive protocols, quality metrics, and best practice recommendations. A written personalized care plan for preventive services as well as general preventive health recommendations were provided to patient.     Demetrios LollKristen Cobi Delph, RN   01/25/2017    I have reviewed and agree with the above  AWV documentation.   Murtis SinkSam Bradshaw, MD [facility] 01/25/2017, 11:01 AM

## 2017-03-30 ENCOUNTER — Encounter: Payer: Self-pay | Admitting: *Deleted

## 2017-06-10 ENCOUNTER — Other Ambulatory Visit: Payer: Self-pay | Admitting: Physician Assistant

## 2017-07-02 ENCOUNTER — Other Ambulatory Visit: Payer: Self-pay | Admitting: Physician Assistant

## 2017-07-02 DIAGNOSIS — E782 Mixed hyperlipidemia: Secondary | ICD-10-CM

## 2017-07-19 ENCOUNTER — Ambulatory Visit: Payer: Medicare Other | Admitting: Physician Assistant

## 2017-07-20 ENCOUNTER — Other Ambulatory Visit: Payer: Self-pay | Admitting: Physician Assistant

## 2017-07-20 DIAGNOSIS — F5101 Primary insomnia: Secondary | ICD-10-CM

## 2017-07-20 NOTE — Telephone Encounter (Signed)
Last seen 01/16/17  [name] 

## 2017-07-24 ENCOUNTER — Ambulatory Visit: Payer: Medicare Other | Admitting: Physician Assistant

## 2017-07-24 ENCOUNTER — Encounter: Payer: Self-pay | Admitting: Physician Assistant

## 2017-07-24 VITALS — BP 128/73 | HR 85 | Ht 62.0 in | Wt 196.0 lb

## 2017-07-24 DIAGNOSIS — Z6836 Body mass index (BMI) 36.0-36.9, adult: Secondary | ICD-10-CM | POA: Diagnosis not present

## 2017-07-24 DIAGNOSIS — F330 Major depressive disorder, recurrent, mild: Secondary | ICD-10-CM

## 2017-07-24 DIAGNOSIS — E782 Mixed hyperlipidemia: Secondary | ICD-10-CM | POA: Diagnosis not present

## 2017-07-24 DIAGNOSIS — F5101 Primary insomnia: Secondary | ICD-10-CM | POA: Diagnosis not present

## 2017-07-24 DIAGNOSIS — R739 Hyperglycemia, unspecified: Secondary | ICD-10-CM

## 2017-07-24 DIAGNOSIS — Z0000 Encounter for general adult medical examination without abnormal findings: Secondary | ICD-10-CM

## 2017-07-24 MED ORDER — VENLAFAXINE HCL ER 75 MG PO CP24: 75.0000 mg | ORAL_CAPSULE | Freq: Every day | ORAL | 3 refills | Status: DC

## 2017-07-24 MED ORDER — ALPRAZOLAM 0.25 MG PO TABS
0.2500 mg | ORAL_TABLET | Freq: Every evening | ORAL | 0 refills | Status: DC | PRN
Start: 2017-07-24 — End: 2018-02-06

## 2017-07-24 MED ORDER — ATORVASTATIN CALCIUM 20 MG PO TABS: 20.0000 mg | ORAL_TABLET | Freq: Every day | ORAL | 3 refills | Status: DC

## 2017-07-24 MED ORDER — PHENTERMINE HCL 37.5 MG PO CAPS: 37.5000 mg | ORAL_CAPSULE | Freq: Every day | ORAL | 3 refills | Status: DC

## 2017-07-24 MED ORDER — SUVOREXANT 20 MG PO TABS: 1.0000 | ORAL_TABLET | Freq: Every day | ORAL | 1 refills | Status: DC

## 2017-07-24 NOTE — Progress Notes (Signed)
BP 128/73   Pulse 85   Ht '5\' 2"'  (1.575 m)   Wt 196 lb (88.9 kg) Comment: Patient refused  BMI 35.85 kg/m    Subjective:    Patient ID: Sabrina Stein, female    DOB: 06/29/1950, 67 y.o.   MRN: [id number]  HPI: Sabrina Stein is a 67 y.o. female presenting on 07/24/2017 for Follow-up (6 month )  This patient comes in for 30-monthrecheck on her conditions.  Her history includes insomnia, hyperlipidemia, obesity, anemia, depression.  Overall she is fairly stable with her medications.  She wants to work harder on her diet efforts.  Her weight was stable at this point.  She is having a little bit more anxiety with situations that she has going on.  She has used Xanax in the past on a very irregular basis we will send her a small amount to the pharmacy.  Other refills are sent and she will come back next week for her lab work.  Past Medical History:  Diagnosis Date  . Depression   . Hyperlipidemia    Relevant past medical, surgical, family and social history reviewed and updated as indicated. Interim medical history since our last visit reviewed. Allergies and medications reviewed and updated. DATA REVIEWED: CHART IN EPIC  Family History reviewed for pertinent findings.  Review of Systems  Constitutional: Negative.  Negative for activity change, fatigue and fever.  HENT: Negative.   Eyes: Negative.   Respiratory: Negative.  Negative for cough.   Cardiovascular: Negative.  Negative for chest pain.  Gastrointestinal: Negative.  Negative for abdominal pain.  Endocrine: Negative.   Genitourinary: Negative.  Negative for dysuria.  Musculoskeletal: Negative.   Skin: Negative.   Neurological: Negative.   Psychiatric/Behavioral: Positive for decreased concentration, dysphoric mood and sleep disturbance. The patient is nervous/anxious.     Allergies as of 07/24/2017      Reactions   Codeine Nausea And Vomiting      Medication List        Accurate as of 07/24/17  8:35 AM. Always use your  most recent med list.          ALPRAZolam 0.25 MG tablet Commonly known as:  XANAX Take 1 tablet (0.25 mg total) by mouth at bedtime as needed for anxiety.   atorvastatin 20 MG tablet Commonly known as:  LIPITOR Take 1 tablet (20 mg total) by mouth daily.   phentermine 37.5 MG capsule Take 1 capsule (37.5 mg total) by mouth daily.   Suvorexant 20 MG Tabs Commonly known as:  BELSOMRA Take 1 tablet by mouth at bedtime.   venlafaxine XR 75 MG 24 hr capsule Commonly known as:  EFFEXOR-XR Take 1 capsule (75 mg total) by mouth daily.          Objective:    BP 128/73   Pulse 85   Ht '5\' 2"'  (1.575 m)   Wt 196 lb (88.9 kg) Comment: Patient refused  BMI 35.85 kg/m   Allergies  Allergen Reactions  . Codeine Nausea And Vomiting    Wt Readings from Last 3 Encounters:  07/24/17 196 lb (88.9 kg)  01/25/17 196 lb (88.9 kg)  01/16/17 194 lb 12.8 oz (88.4 kg)    Physical Exam  Constitutional: She is oriented to person, place, and time. She appears well-developed and well-nourished.  HENT:  Head: Normocephalic and atraumatic.  Eyes: Pupils are equal, round, and reactive to light. Conjunctivae and EOM are normal.  Cardiovascular: Normal rate, regular rhythm, normal heart sounds  and intact distal pulses.  Pulmonary/Chest: Effort normal and breath sounds normal.  Abdominal: Soft. Bowel sounds are normal.  Neurological: She is alert and oriented to person, place, and time. She has normal reflexes.  Skin: Skin is warm and dry. No rash noted.  Psychiatric: She has a normal mood and affect. Her behavior is normal. Judgment and thought content normal.  Nursing note and vitals reviewed.   Results for orders placed or performed in visit on 06/05/16  Lipid Panel  Result Value Ref Range   Cholesterol, Total 136 100 - 199 mg/dL   Triglycerides 109 0 - 149 mg/dL   HDL 58 >39 mg/dL   VLDL Cholesterol Cal 22 5 - 40 mg/dL   LDL Calculated 56 0 - 99 mg/dL   Chol/HDL Ratio 2.3 0.0 - 4.4  ratio units  CMP14+EGFR  Result Value Ref Range   Glucose 100 (H) 65 - 99 mg/dL   BUN 11 8 - 27 mg/dL   Creatinine, Ser 0.89 0.57 - 1.00 mg/dL   GFR calc non Af Amer 68 >59 mL/min/1.73   GFR calc Af Amer 79 >59 mL/min/1.73   BUN/Creatinine Ratio 12 12 - 28   Sodium 140 134 - 144 mmol/L   Potassium 4.1 3.5 - 5.2 mmol/L   Chloride 101 96 - 106 mmol/L   CO2 24 18 - 29 mmol/L   Calcium 8.8 8.7 - 10.3 mg/dL   Total Protein 6.4 6.0 - 8.5 g/dL   Albumin 4.2 3.6 - 4.8 g/dL   Globulin, Total 2.2 1.5 - 4.5 g/dL   Albumin/Globulin Ratio 1.9 1.2 - 2.2   Bilirubin Total 0.3 0.0 - 1.2 mg/dL   Alkaline Phosphatase 112 39 - 117 IU/L   AST 24 0 - 40 IU/L   ALT 24 0 - 32 IU/L  Bayer DCA Hb A1c Waived  Result Value Ref Range   Bayer DCA Hb A1c Waived 5.9 <7.0 %      Assessment & Plan:   1. Primary insomnia - Suvorexant (BELSOMRA) 20 MG TABS; Take 1 tablet by mouth at bedtime.  Dispense: 90 tablet; Refill: 1  2. Mixed hyperlipidemia  - Lipid panel; Future - atorvastatin (LIPITOR) 20 MG tablet; Take 1 tablet (20 mg total) by mouth daily.  Dispense: 90 tablet; Refill: 3  3. Body mass index 36.0-36.9, adult - phentermine 37.5 MG capsule; Take 1 capsule (37.5 mg total) by mouth daily.  Dispense: 30 capsule; Refill: 3  4. Hyperglycemia - Bayer DCA Hb A1c Waived; Future  5. Well adult exam - CBC with Differential/Platelet; Future - CMP14+EGFR; Future - Lipid panel; Future - TSH; Future - Bayer DCA Hb A1c Waived; Future  6. Mild episode of recurrent major depressive disorder (HCC) - ALPRAZolam (XANAX) 0.25 MG tablet; Take 1 tablet (0.25 mg total) by mouth at bedtime as needed for anxiety.  Dispense: 30 tablet; Refill: 0 - venlafaxine XR (EFFEXOR-XR) 75 MG 24 hr capsule; Take 1 capsule (75 mg total) by mouth daily.  Dispense: 90 capsule; Refill: 3   Continue all other maintenance medications as listed above.  Follow up plan: Return in about 6 months (around 01/23/2018) for  recheck.  Educational handout given for [employer] PA-C [place] [address]  [place], [place] [address] [phone]   07/24/2017, 8:35 AM

## 2017-07-24 NOTE — Patient Instructions (Signed)
In a few days you may receive a survey in the mail or online from [employer] regarding your visit with us today. Please take a moment to fill this out. Your feedback is very important to our whole office. It can help us better understand your needs as well as improve your experience and satisfaction. Thank you for taking your time to complete it. We care about you.  Shalise Rosado, PA-C  

## 2017-08-07 ENCOUNTER — Encounter: Payer: Self-pay | Admitting: Physician Assistant

## 2017-08-08 ENCOUNTER — Other Ambulatory Visit: Payer: Self-pay | Admitting: Physician Assistant

## 2017-08-08 MED ORDER — DOXYCYCLINE HYCLATE 100 MG PO TABS: 100.0000 mg | ORAL_TABLET | Freq: Two times a day (BID) | ORAL | 0 refills | Status: DC

## 2017-08-08 NOTE — Progress Notes (Signed)
doxycy

## 2018-01-23 ENCOUNTER — Encounter: Payer: Self-pay | Admitting: Physician Assistant

## 2018-01-24 ENCOUNTER — Other Ambulatory Visit: Payer: Medicare Other

## 2018-01-24 DIAGNOSIS — E782 Mixed hyperlipidemia: Secondary | ICD-10-CM

## 2018-01-24 DIAGNOSIS — R739 Hyperglycemia, unspecified: Secondary | ICD-10-CM

## 2018-01-24 DIAGNOSIS — Z0000 Encounter for general adult medical examination without abnormal findings: Secondary | ICD-10-CM

## 2018-01-24 LAB — BAYER DCA HB A1C WAIVED: HB A1C: 6 % (ref <7.0)

## 2018-01-25 LAB — CBC WITH DIFFERENTIAL/PLATELET
BASOS ABS: 0.1 10*3/uL (ref 0.0–0.2)
Basos: 1 %
EOS (ABSOLUTE): 0.1 10*3/uL (ref 0.0–0.4)
Eos: 1 %
Hematocrit: 39.4 % (ref 34.0–46.6)
Hemoglobin: 13.1 g/dL (ref 11.1–15.9)
Immature Grans (Abs): 0 10*3/uL (ref 0.0–0.1)
Immature Granulocytes: 0 %
LYMPHS ABS: 2.1 10*3/uL (ref 0.7–3.1)
Lymphs: 29 %
MCH: 30.8 pg (ref 26.6–33.0)
MCHC: 33.2 g/dL (ref 31.5–35.7)
MCV: 93 fL (ref 79–97)
MONOS ABS: 0.5 10*3/uL (ref 0.1–0.9)
Monocytes: 7 %
Neutrophils Absolute: 4.5 10*3/uL (ref 1.4–7.0)
Neutrophils: 62 %
PLATELETS: 341 10*3/uL (ref 150–450)
RBC: 4.26 x10E6/uL (ref 3.77–5.28)
RDW: 12.9 % (ref 12.3–15.4)
WBC: 7.3 10*3/uL (ref 3.4–10.8)

## 2018-01-25 LAB — CMP14+EGFR
ALK PHOS: 142 IU/L — AB (ref 39–117)
ALT: 30 IU/L (ref 0–32)
AST: 21 IU/L (ref 0–40)
Albumin/Globulin Ratio: 1.7 (ref 1.2–2.2)
Albumin: 4.3 g/dL (ref 3.6–4.8)
BILIRUBIN TOTAL: 0.3 mg/dL (ref 0.0–1.2)
BUN / CREAT RATIO: 14 (ref 12–28)
BUN: 12 mg/dL (ref 8–27)
CALCIUM: 9.2 mg/dL (ref 8.7–10.3)
CHLORIDE: 102 mmol/L (ref 96–106)
CO2: 22 mmol/L (ref 20–29)
Creatinine, Ser: 0.88 mg/dL (ref 0.57–1.00)
GFR calc Af Amer: 79 mL/min/{1.73_m2} (ref >59)
GFR calc non Af Amer: 68 mL/min/{1.73_m2} (ref >59)
GLOBULIN, TOTAL: 2.5 g/dL (ref 1.5–4.5)
GLUCOSE: 114 mg/dL — AB (ref 65–99)
Potassium: 4.1 mmol/L (ref 3.5–5.2)
SODIUM: 139 mmol/L (ref 134–144)
Total Protein: 6.8 g/dL (ref 6.0–8.5)

## 2018-01-25 LAB — LIPID PANEL
CHOL/HDL RATIO: 2.5 ratio (ref 0.0–4.4)
Cholesterol, Total: 156 mg/dL (ref 100–199)
HDL: 63 mg/dL (ref >39)
LDL Calculated: 70 mg/dL (ref 0–99)
Triglycerides: 116 mg/dL (ref 0–149)
VLDL Cholesterol Cal: 23 mg/dL (ref 5–40)

## 2018-01-25 LAB — TSH: TSH: 1.83 u[IU]/mL (ref 0.450–4.500)

## 2018-02-06 ENCOUNTER — Encounter: Payer: Self-pay | Admitting: Physician Assistant

## 2018-02-06 ENCOUNTER — Ambulatory Visit: Payer: Medicare Other | Admitting: Physician Assistant

## 2018-02-06 VITALS — BP 134/81 | HR 83 | Temp 97.5°F | Ht 62.0 in | Wt 195.6 lb

## 2018-02-06 DIAGNOSIS — R739 Hyperglycemia, unspecified: Secondary | ICD-10-CM

## 2018-02-06 DIAGNOSIS — E782 Mixed hyperlipidemia: Secondary | ICD-10-CM | POA: Diagnosis not present

## 2018-02-06 DIAGNOSIS — Z6836 Body mass index (BMI) 36.0-36.9, adult: Secondary | ICD-10-CM

## 2018-02-06 DIAGNOSIS — F5101 Primary insomnia: Secondary | ICD-10-CM | POA: Diagnosis not present

## 2018-02-06 DIAGNOSIS — F330 Major depressive disorder, recurrent, mild: Secondary | ICD-10-CM

## 2018-02-06 DIAGNOSIS — Z23 Encounter for immunization: Secondary | ICD-10-CM | POA: Diagnosis not present

## 2018-02-06 MED ORDER — SUVOREXANT 20 MG PO TABS: 1.0000 | ORAL_TABLET | Freq: Every day | ORAL | 1 refills | Status: DC

## 2018-02-06 MED ORDER — PHENTERMINE HCL 37.5 MG PO CAPS: 37.5000 mg | ORAL_CAPSULE | Freq: Every day | ORAL | 3 refills | Status: DC

## 2018-02-06 MED ORDER — ALPRAZOLAM 0.25 MG PO TABS: 0.2500 mg | ORAL_TABLET | Freq: Every evening | ORAL | 5 refills | Status: DC | PRN

## 2018-02-06 MED ORDER — TOLTERODINE TARTRATE ER 4 MG PO CP24: 4.0000 mg | ORAL_CAPSULE | Freq: Every day | ORAL | 3 refills | Status: DC

## 2018-02-07 ENCOUNTER — Ambulatory Visit (INDEPENDENT_AMBULATORY_CARE_PROVIDER_SITE_OTHER): Payer: Medicare Other

## 2018-02-07 ENCOUNTER — Ambulatory Visit (INDEPENDENT_AMBULATORY_CARE_PROVIDER_SITE_OTHER): Payer: Medicare Other | Admitting: *Deleted

## 2018-02-07 ENCOUNTER — Encounter: Payer: Self-pay | Admitting: *Deleted

## 2018-02-07 VITALS — BP 131/85 | HR 77 | Ht 62.0 in | Wt 195.0 lb

## 2018-02-07 DIAGNOSIS — Z1212 Encounter for screening for malignant neoplasm of rectum: Secondary | ICD-10-CM

## 2018-02-07 DIAGNOSIS — Z0000 Encounter for general adult medical examination without abnormal findings: Secondary | ICD-10-CM

## 2018-02-07 DIAGNOSIS — Z780 Asymptomatic menopausal state: Secondary | ICD-10-CM

## 2018-02-07 DIAGNOSIS — Z1211 Encounter for screening for malignant neoplasm of colon: Secondary | ICD-10-CM

## 2018-02-07 DIAGNOSIS — Z23 Encounter for immunization: Secondary | ICD-10-CM

## 2018-02-07 NOTE — Progress Notes (Signed)
Subjective:   Sabrina Stein is a 67 y.o. female who presents for Medicare Annual (Subsequent) preventive examination.  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  She enjoys various types or crafts, teaching crafting classes locally, and reading.  She has 2 children, and 2 grandchildren.  Sabrina Stein lives at home with her husband and their 2 inside dogs.  She feels her health has improved over the last year because she was diligent about exercise until recently.  She reports no hospitalizations, ER visits, or surgeries in the past year.   Review of Systems:  All negative today       Objective:     Vitals: BP 131/85   Pulse 77   Ht 5\' 2"  (1.575 m)   Wt 195 lb (88.5 kg)   BMI 35.67 kg/m   Body mass index is 35.67 kg/m.  Advanced Directives 02/07/2018 01/25/2017  Does Patient Have a Medical Advance Directive? Yes Yes  Type of Estate agent of [place];Living will Living will;Healthcare Power of Attorney  Does patient want to make changes to medical advance directive? No - Patient declined No - Patient declined  Copy of Healthcare Power of Attorney in Chart? No - copy requested No - copy requested    Tobacco Social History   Tobacco Use  Smoking Status Former Smoker  . Last attempt to quit: 01/26/2012  . Years since quitting: 6.0  Smokeless Tobacco Never Used       Clinical Intake:     Pain Score: 0-No pain                 Past Medical History:  Diagnosis Date  . Depression   . Hyperlipidemia    Past Surgical History:  Procedure Laterality Date  . APPENDECTOMY    . CESAREAN SECTION     Family History  Problem Relation Age of Onset  . Cancer Father        lung  . Healthy Sister   . Healthy Daughter   . Healthy Son    Social History   Socioeconomic History  . Marital status: Married    Spouse name: Not on file  . Number of children: 2  . Years of education: 81  . Highest education level: Master's  degree (e.g., MA, MS, MEng, MEd, MSW, MBA)  Occupational History  . Occupation: Retired   Engineer, production  . Financial resource strain: Not hard at all  . Food insecurity:    Worry: Never true    Inability: Never true  . Transportation needs:    Medical: No    Non-medical: No  Tobacco Use  . Smoking status: Former Smoker    Last attempt to quit: 01/26/2012    Years since quitting: 6.0  . Smokeless tobacco: Never Used  Substance and Sexual Activity  . Alcohol use: Yes    Comment: occassional   . Drug use: No  . Sexual activity: Yes  Lifestyle  . Physical activity:    Days per week: 0 days    Minutes per session: 0 min  . Stress: Not on file  Relationships  . Social connections:    Talks on phone: More than three times a week    Gets together: More than three times a week    Attends religious service: Never    Active member of club or organization: Yes    Attends meetings of clubs or organizations: More than 4 times per year    Relationship  status: Married  Other Topics Concern  . Not on file  Social History Narrative  . Not on file    Outpatient Encounter Medications as of 02/07/2018  Medication Sig  . ALPRAZolam (XANAX) 0.25 MG tablet Take 1 tablet (0.25 mg total) by mouth at bedtime as needed for anxiety.  Marland Kitchen. atorvastatin (LIPITOR) 20 MG tablet Take 1 tablet (20 mg total) by mouth daily.  . phentermine 37.5 MG capsule Take 1 capsule (37.5 mg total) by mouth daily.  . Suvorexant (BELSOMRA) 20 MG TABS Take 1 tablet by mouth at bedtime.  . tolterodine (DETROL LA) 4 MG 24 hr capsule Take 1 capsule (4 mg total) by mouth daily.  Marland Kitchen. venlafaxine XR (EFFEXOR-XR) 75 MG 24 hr capsule Take 1 capsule (75 mg total) by mouth daily.   No facility-administered encounter medications on file as of 02/07/2018.     Activities of Daily Living In your present state of health, do you have any difficulty performing the following activities: 02/07/2018  Hearing? N  Vision? Y  Comment Needs  new glasses prescription, plans to go in the next couple of months   Difficulty concentrating or making decisions? N  Walking or climbing stairs? N  Dressing or bathing? N  Doing errands, shopping? N  Preparing Food and eating ? N  Using the Toilet? N  In the past six months, have you accidently leaked urine? Y  Comment Urinary urgency at night, started on Detrol LA yesterday  Do you have problems with loss of bowel control? N  Managing your Medications? N  Managing your Finances? N  Housekeeping or managing your Housekeeping? N  Some recent data might be hidden    Patient Care Team: Caryl NeverJones, [name] S, PA-C as PCP - General (Physician Assistant)    Assessment:   This is a routine wellness examination for [name].  Exercise Activities and Dietary recommendations  Mrs. Sabrina Stein states she eats 2 meals per day, lunch and supper.  States she has never liked eating breakfast.  Usually for lunch she has a salad, and for supper she has meat - grilled and vegetables, or eats out.  States her meals out are not usually as healthy as her home cooked meals.  Recommended a diet of mostly lean proteins, non starchy vegetables, fruits, and whole grains.  Recommended grilled meat options when eating out with double vegetables.  States she has access to all the food she needs.  Current Exercise Habits: The patient does not participate in regular exercise at present(Patient states she had been walking regularly earlier in the year, but has not been walking lately) Patient states she had been walking 3 miles per day - 5 days per week until in the past couple of months.  She plans to resume her exercise regimen.     Goals    . Exercise 150 minutes per week (moderate activity)     Walking is a great option.  Fall Risk Fall Risk  02/07/2018 02/06/2018 07/24/2017 01/25/2017 01/16/2017  Falls in the past year? 0 0 No No No   Is the patient's home free of loose throw rugs in walkways, pet beds, electrical  cords, etc?   yes      Grab bars in the bathroom? no      Handrails on the stairs?   yes      Adequate lighting?   yes    Depression Screen PHQ 2/9 Scores 02/07/2018 02/06/2018 07/24/2017 01/25/2017  PHQ - 2 Score 0 0 0 0  Cognitive Function MMSE - Mini Mental State Exam 02/07/2018 01/25/2017  Orientation to time 5 5  Orientation to Place 5 5  Registration 3 3  Attention/ Calculation 5 5  Recall 3 3  Language- name 2 objects 2 2  Language- repeat 1 1  Language- follow 3 step command 3 3  Language- read & follow direction 1 1  Write a sentence 1 1  Copy design 1 1  Total score 30 30        Immunization History  Administered Date(s) Administered  . Influenza, High Dose Seasonal PF 12/20/2015, 02/06/2018  . Influenza,inj,Quad PF,6+ Mos 01/16/2017  . Pneumococcal Conjugate-13 12/06/2015  . Td 11/14/2011  . Zoster Recombinat (Shingrix) 01/25/2017    Qualifies for Shingles Vaccine? Yes, completed today  Screening Tests Health Maintenance  Topic Date Due  . Hepatitis C Screening  1950/10/03  . FOOT EXAM  10/12/1960  . OPHTHALMOLOGY EXAM  10/12/1960  . URINE MICROALBUMIN  10/12/1960  . DEXA SCAN  10/13/2015  . PNA vac Low Risk Adult (2 of 2 - PPSV23) 12/05/2016  . MAMMOGRAM  02/08/2019 (Originally 10/12/2000)  . COLONOSCOPY  02/08/2019 (Originally 10/12/2000)  . HEMOGLOBIN A1C  07/25/2018  . TETANUS/TDAP  11/13/2021  . INFLUENZA VACCINE  Completed   Recommend Hepatitis C, Foot exam, and urine microalbumin at next visit with Prudy Feeler, PA. Patient plans to get eye exam in the next couple of months at [facility]. Dexa scan completed today Patient declined mammogram today, but states she will think about it Cologuard ordered Pneumovax 23 and Shingrix given today     Cancer Screenings: Lung: Low Dose CT Chest recommended if Age 60-80 years, 30 pack-year currently smoking OR have quit w/in 15years. Patient does qualify. Breast:  Up to date on  Mammogram? No Up to date of Bone Density/Dexa? Yes, done today Colorectal: declined colonoscopy.  Agreeable to completing cologuard, test ordered  Additional Screenings:  Hepatitis C Screening: recommend at next visit with Prudy Feeler, PA      Plan:     Work on goal-  Goals    . Exercise 150 minutes per week (moderate activity)     Bring a copy of your living will and health care power of attorney to our office to be filed in your medical record. Complete the Cologuard test and mail back to the company for testing.  Follow up with Prudy Feeler, PA as scheduled.      I have personally reviewed and noted the following in the patient's chart:   . Medical and social history . Use of alcohol, tobacco or illicit drugs  . Current medications and supplements . Functional ability and status . Nutritional status . Physical activity . Advanced directives . List of other physicians . Hospitalizations, surgeries, and ER visits in previous 12 months . Vitals . Screenings to include cognitive, depression, and falls . Referrals and appointments  In addition, I have reviewed and discussed with patient certain preventive protocols, quality metrics, and best practice recommendations. A written personalized care plan for preventive services as well as general preventive health recommendations were provided to patient.     Bernadene Bell, RN  02/07/2018

## 2018-02-07 NOTE — Patient Instructions (Addendum)
Please work on your goal:  Goals    . Exercise 150 minutes per week (moderate activity)      At your convenience, please bring a copy of your living will and health care power of attorney to our office to be filed in your medical record.  Please complete the Cologuard test and mail back to the company for testing.   Please follow up with Particia Nearing, PA as scheduled.   You received your 2nd Shingrix (Shingles) vaccine and Pneumovax 23 (pneumonia) vaccine today.   Thank you for coming in for your Annual Wellness Visit today!!   Preventive Care 65 Years and Older, Female Preventive care refers to lifestyle choices and visits with your health care provider that can promote health and wellness. What does preventive care include?  A yearly physical exam. This is also called an annual well check.  Dental exams once or twice a year.  Routine eye exams. Ask your health care provider how often you should have your eyes checked.  Personal lifestyle choices, including: ? Daily care of your teeth and gums. ? Regular physical activity. ? Eating a healthy diet. ? Avoiding tobacco and drug use. ? Limiting alcohol use. ? Practicing safe sex. ? Taking low-dose aspirin every day. ? Taking vitamin and mineral supplements as recommended by your health care provider. What happens during an annual well check? The services and screenings done by your health care provider during your annual well check will depend on your age, overall health, lifestyle risk factors, and family history of disease. Counseling Your health care provider may ask you questions about your:  Alcohol use.  Tobacco use.  Drug use.  Emotional well-being.  Home and relationship well-being.  Sexual activity.  Eating habits.  History of falls.  Memory and ability to understand (cognition).  Work and work Statistician.  Reproductive health.  Screening You may have the following tests or  measurements:  Height, weight, and BMI.  Blood pressure.  Lipid and cholesterol levels. These may be checked every 5 years, or more frequently if you are over 78 years old.  Skin check.  Lung cancer screening. You may have this screening every year starting at age 14 if you have a 30-pack-year history of smoking and currently smoke or have quit within the past 15 years.  Fecal occult blood test (FOBT) of the stool. You may have this test every year starting at age 62.  Flexible sigmoidoscopy or colonoscopy. You may have a sigmoidoscopy every 5 years or a colonoscopy every 10 years starting at age 63.  Hepatitis C blood test.  Hepatitis B blood test.  Sexually transmitted disease (STD) testing.  Diabetes screening. This is done by checking your blood sugar (glucose) after you have not eaten for a while (fasting). You may have this done every 1-3 years.  Bone density scan. This is done to screen for osteoporosis. You may have this done starting at age 52.  Mammogram. This may be done every 1-2 years. Talk to your health care provider about how often you should have regular mammograms.  Talk with your health care provider about your test results, treatment options, and if necessary, the need for more tests. Vaccines Your health care provider may recommend certain vaccines, such as:  Influenza vaccine. This is recommended every year.  Tetanus, diphtheria, and acellular pertussis (Tdap, Td) vaccine. You may need a Td booster every 10 years.  Varicella vaccine. You may need this if you have not been vaccinated.  Zoster vaccine. You may need this after age 38.  Measles, mumps, and rubella (MMR) vaccine. You may need at least one dose of MMR if you were born in 1957 or later. You may also need a second dose.  Pneumococcal 13-valent conjugate (PCV13) vaccine. One dose is recommended after age 84.  Pneumococcal polysaccharide (PPSV23) vaccine. One dose is recommended after age  25.  Meningococcal vaccine. You may need this if you have certain conditions.  Hepatitis A vaccine. You may need this if you have certain conditions or if you travel or work in places where you may be exposed to hepatitis A.  Hepatitis B vaccine. You may need this if you have certain conditions or if you travel or work in places where you may be exposed to hepatitis B.  Haemophilus influenzae type b (Hib) vaccine. You may need this if you have certain conditions.  Talk to your health care provider about which screenings and vaccines you need and how often you need them. This information is not intended to replace advice given to you by your health care provider. Make sure you discuss any questions you have with your health care provider. Document Released: 04/09/2015 Document Revised: 12/01/2015 Document Reviewed: 01/12/2015 Elsevier Interactive Patient Education  [employer].

## 2018-02-08 NOTE — Progress Notes (Signed)
BP 134/81   Pulse 83   Temp (!) 97.5 F (36.4 C) (Oral)   Ht '5\' 2"'  (1.575 m)   Wt 195 lb 9.6 oz (88.7 kg)   BMI 35.78 kg/m    Subjective:    Patient ID: Sabrina Stein, female    DOB: 02/18/1951, 67 y.o.   MRN: [id number]  HPI: Sabrina Stein is a 67 y.o. female presenting on 02/06/2018 for Hyperlipidemia (6 month follow up )  This patient comes in for 63-monthrecheck on all of her chronic medical conditions.  They do include hyperlipidemia, hyper glycemia, insomnia, depression.  Overall she states that she is doing very well.  She does need labs performed today.  Refills will be sent.  Past Medical History:  Diagnosis Date  . Depression   . Hyperlipidemia    Relevant past medical, surgical, family and social history reviewed and updated as indicated. Interim medical history since our last visit reviewed. Allergies and medications reviewed and updated. DATA REVIEWED: CHART IN EPIC  Family History reviewed for pertinent findings.  Review of Systems  Constitutional: Negative.  Negative for activity change, fatigue and fever.  HENT: Negative.   Eyes: Negative.   Respiratory: Negative.  Negative for cough.   Cardiovascular: Negative.  Negative for chest pain.  Gastrointestinal: Negative.  Negative for abdominal pain.  Endocrine: Negative.   Genitourinary: Negative.  Negative for dysuria.  Musculoskeletal: Negative.   Skin: Negative.   Neurological: Negative.     Allergies as of 02/06/2018      Reactions   Codeine Nausea And Vomiting      Medication List        Accurate as of 02/06/18 11:59 PM. Always use your most recent med list.          ALPRAZolam 0.25 MG tablet Commonly known as:  XANAX Take 1 tablet (0.25 mg total) by mouth at bedtime as needed for anxiety.   atorvastatin 20 MG tablet Commonly known as:  LIPITOR Take 1 tablet (20 mg total) by mouth daily.   phentermine 37.5 MG capsule Take 1 capsule (37.5 mg total) by mouth daily.   Suvorexant 20  MG Tabs Take 1 tablet by mouth at bedtime.   tolterodine 4 MG 24 hr capsule Commonly known as:  DETROL LA Take 1 capsule (4 mg total) by mouth daily.   venlafaxine XR 75 MG 24 hr capsule Commonly known as:  EFFEXOR-XR Take 1 capsule (75 mg total) by mouth daily.          Objective:    BP 134/81   Pulse 83   Temp (!) 97.5 F (36.4 C) (Oral)   Ht '5\' 2"'  (1.575 m)   Wt 195 lb 9.6 oz (88.7 kg)   BMI 35.78 kg/m   Allergies  Allergen Reactions  . Codeine Nausea And Vomiting    Wt Readings from Last 3 Encounters:  02/07/18 195 lb (88.5 kg)  02/06/18 195 lb 9.6 oz (88.7 kg)  07/24/17 196 lb (88.9 kg)    Physical Exam  Constitutional: She is oriented to person, place, and time. She appears well-developed and well-nourished.  HENT:  Head: Normocephalic and atraumatic.  Eyes: Pupils are equal, round, and reactive to light. Conjunctivae and EOM are normal.  Cardiovascular: Normal rate, regular rhythm, normal heart sounds and intact distal pulses.  Pulmonary/Chest: Effort normal and breath sounds normal.  Abdominal: Soft. Bowel sounds are normal.  Neurological: She is alert and oriented to person, place, and time. She has  normal reflexes.  Skin: Skin is warm and dry. No rash noted.  Psychiatric: She has a normal mood and affect. Her behavior is normal. Judgment and thought content normal.    Results for orders placed or performed in visit on 01/24/18  Bayer DCA Hb A1c Waived  Result Value Ref Range   HB A1C (BAYER DCA - WAIVED) 6.0 <7.0 %  TSH  Result Value Ref Range   TSH 1.830 0.450 - 4.500 uIU/mL  Lipid panel  Result Value Ref Range   Cholesterol, Total 156 100 - 199 mg/dL   Triglycerides 116 0 - 149 mg/dL   HDL 63 >39 mg/dL   VLDL Cholesterol Cal 23 5 - 40 mg/dL   LDL Calculated 70 0 - 99 mg/dL   Chol/HDL Ratio 2.5 0.0 - 4.4 ratio  CMP14+EGFR  Result Value Ref Range   Glucose 114 (H) 65 - 99 mg/dL   BUN 12 8 - 27 mg/dL   Creatinine, Ser 0.88 0.57 - 1.00 mg/dL     GFR calc non Af Amer 68 >59 mL/min/1.73   GFR calc Af Amer 79 >59 mL/min/1.73   BUN/Creatinine Ratio 14 12 - 28   Sodium 139 134 - 144 mmol/L   Potassium 4.1 3.5 - 5.2 mmol/L   Chloride 102 96 - 106 mmol/L   CO2 22 20 - 29 mmol/L   Calcium 9.2 8.7 - 10.3 mg/dL   Total Protein 6.8 6.0 - 8.5 g/dL   Albumin 4.3 3.6 - 4.8 g/dL   Globulin, Total 2.5 1.5 - 4.5 g/dL   Albumin/Globulin Ratio 1.7 1.2 - 2.2   Bilirubin Total 0.3 0.0 - 1.2 mg/dL   Alkaline Phosphatase 142 (H) 39 - 117 IU/L   AST 21 0 - 40 IU/L   ALT 30 0 - 32 IU/L  CBC with Differential/Platelet  Result Value Ref Range   WBC 7.3 3.4 - 10.8 x10E3/uL   RBC 4.26 3.77 - 5.28 x10E6/uL   Hemoglobin 13.1 11.1 - 15.9 g/dL   Hematocrit 39.4 34.0 - 46.6 %   MCV 93 79 - 97 fL   MCH 30.8 26.6 - 33.0 pg   MCHC 33.2 31.5 - 35.7 g/dL   RDW 12.9 12.3 - 15.4 %   Platelets 341 150 - 450 x10E3/uL   Neutrophils 62 Not Estab. %   Lymphs 29 Not Estab. %   Monocytes 7 Not Estab. %   Eos 1 Not Estab. %   Basos 1 Not Estab. %   Neutrophils Absolute 4.5 1.4 - 7.0 x10E3/uL   Lymphocytes Absolute 2.1 0.7 - 3.1 x10E3/uL   Monocytes Absolute 0.5 0.1 - 0.9 x10E3/uL   EOS (ABSOLUTE) 0.1 0.0 - 0.4 x10E3/uL   Basophils Absolute 0.1 0.0 - 0.2 x10E3/uL   Immature Granulocytes 0 Not Estab. %   Immature Grans (Abs) 0.0 0.0 - 0.1 x10E3/uL      Assessment & Plan:   1. Mixed hyperlipidemia Continue diet and exercise  2. Hyperglycemia Continue diet and exercise  3. Primary insomnia - Suvorexant (BELSOMRA) 20 MG TABS; Take 1 tablet by mouth at bedtime.  Dispense: 90 tablet; Refill: 1  4. Mild episode of recurrent major depressive disorder (HCC) - ALPRAZolam (XANAX) 0.25 MG tablet; Take 1 tablet (0.25 mg total) by mouth at bedtime as needed for anxiety.  Dispense: 30 tablet; Refill: 5  5. Body mass index 36.0-36.9, adult Continue diet and exercise - phentermine 37.5 MG capsule; Take 1 capsule (37.5 mg total) by mouth daily.  Dispense: 30  capsule; Refill: 3  6. Encounter for immunization - Flu vaccine HIGH DOSE PF   Continue all other maintenance medications as listed above.  Follow up plan: Return in about 6 months (around 08/07/2018) for recheck.  Educational handout given for [place] PA-C [place] [address]  [place], [place] [address] [phone]   02/08/2018, 10:10 AM

## 2018-02-15 ENCOUNTER — Encounter: Payer: Self-pay | Admitting: *Deleted

## 2018-05-31 ENCOUNTER — Encounter: Payer: Self-pay | Admitting: Physician Assistant

## 2018-07-20 ENCOUNTER — Other Ambulatory Visit: Payer: Self-pay | Admitting: Physician Assistant

## 2018-07-20 DIAGNOSIS — F330 Major depressive disorder, recurrent, mild: Secondary | ICD-10-CM

## 2018-07-30 ENCOUNTER — Encounter: Payer: Self-pay | Admitting: Physician Assistant

## 2018-08-03 ENCOUNTER — Encounter: Payer: Self-pay | Admitting: Physician Assistant

## 2018-08-06 ENCOUNTER — Encounter: Payer: Self-pay | Admitting: Physician Assistant

## 2018-08-07 ENCOUNTER — Other Ambulatory Visit: Payer: Self-pay

## 2018-08-07 ENCOUNTER — Ambulatory Visit (INDEPENDENT_AMBULATORY_CARE_PROVIDER_SITE_OTHER): Payer: Medicare Other | Admitting: Physician Assistant

## 2018-08-07 DIAGNOSIS — E782 Mixed hyperlipidemia: Secondary | ICD-10-CM | POA: Diagnosis not present

## 2018-08-07 DIAGNOSIS — F330 Major depressive disorder, recurrent, mild: Secondary | ICD-10-CM

## 2018-08-07 DIAGNOSIS — Z6836 Body mass index (BMI) 36.0-36.9, adult: Secondary | ICD-10-CM | POA: Diagnosis not present

## 2018-08-07 DIAGNOSIS — F5101 Primary insomnia: Secondary | ICD-10-CM | POA: Diagnosis not present

## 2018-08-07 MED ORDER — ALPRAZOLAM 0.25 MG PO TABS: 0.2500 mg | ORAL_TABLET | Freq: Every evening | ORAL | 5 refills | Status: DC | PRN

## 2018-08-07 MED ORDER — VENLAFAXINE HCL ER 75 MG PO CP24: ORAL_CAPSULE | ORAL | 3 refills | Status: DC

## 2018-08-07 MED ORDER — SUVOREXANT 20 MG PO TABS: 1.0000 | ORAL_TABLET | Freq: Every day | ORAL | 1 refills | Status: DC

## 2018-08-07 MED ORDER — ATORVASTATIN CALCIUM 20 MG PO TABS: 20.0000 mg | ORAL_TABLET | Freq: Every day | ORAL | 3 refills | Status: DC

## 2018-08-07 MED ORDER — TOLTERODINE TARTRATE ER 4 MG PO CP24: 4.0000 mg | ORAL_CAPSULE | Freq: Every day | ORAL | 3 refills | Status: DC

## 2018-08-07 MED ORDER — PHENTERMINE HCL 37.5 MG PO CAPS: 37.5000 mg | ORAL_CAPSULE | Freq: Every day | ORAL | 3 refills | Status: DC

## 2018-08-12 ENCOUNTER — Encounter: Payer: Self-pay | Admitting: Physician Assistant

## 2018-08-12 NOTE — Progress Notes (Signed)
Telephone visit  Subjective: CC: Recheck on chronic conditions PCP: Remus Loffler, PA-C YKZ:LDJTT Sabrina Stein is a 68 y.o. female calls for telephone consult today. Patient provides verbal consent for consult held via phone.  Patient is identified with 2 separate identifiers.  At this time the entire area is on COVID-19 social distancing and stay home orders are in place.  Patient is of higher risk and therefore we are performing this by a virtual method.  Location of patient: Home Location of provider: [facility] Others present for call: No  This patient does have chronic medical conditions of hyperlipidemia, insomnia, depression with anxiety.  She is working hard and still trying to lose weight.  She will come and have labs performed in the near future.  Order will be placed.  Her current alprazolam dose has her at 0.4 LME per day.  We will discontinue to watch this and do further testing if it increases.  She states at this time all of her medications are doing very well and stable.   ROS: Per HPI  Allergies  Allergen Reactions  . Codeine Nausea And Vomiting   Past Medical History:  Diagnosis Date  . Depression   . Hyperlipidemia     Current Outpatient Medications:  .  ALPRAZolam (XANAX) 0.25 MG tablet, Take 1 tablet (0.25 mg total) by mouth at bedtime as needed for anxiety., Disp: 30 tablet, Rfl: 5 .  atorvastatin (LIPITOR) 20 MG tablet, Take 1 tablet (20 mg total) by mouth daily., Disp: 90 tablet, Rfl: 3 .  phentermine 37.5 MG capsule, Take 1 capsule (37.5 mg total) by mouth daily., Disp: 30 capsule, Rfl: 3 .  Suvorexant (BELSOMRA) 20 MG TABS, Take 1 tablet by mouth at bedtime., Disp: 90 tablet, Rfl: 1 .  tolterodine (DETROL LA) 4 MG 24 hr capsule, Take 1 capsule (4 mg total) by mouth daily., Disp: 90 capsule, Rfl: 3 .  venlafaxine XR (EFFEXOR-XR) 75 MG 24 hr capsule, TAKE 1 CAPSULE BY MOUTH EVERY DAY, Disp: 90 capsule, Rfl: 3  Assessment/ Plan: 68 y.o. female   1. Mixed  hyperlipidemia - atorvastatin (LIPITOR) 20 MG tablet; Take 1 tablet (20 mg total) by mouth daily.  Dispense: 90 tablet; Refill: 3  2. Primary insomnia - Suvorexant (BELSOMRA) 20 MG TABS; Take 1 tablet by mouth at bedtime.  Dispense: 90 tablet; Refill: 1  3. Mild episode of recurrent major depressive disorder (HCC) - venlafaxine XR (EFFEXOR-XR) 75 MG 24 hr capsule; TAKE 1 CAPSULE BY MOUTH EVERY DAY  Dispense: 90 capsule; Refill: 3 - ALPRAZolam (XANAX) 0.25 MG tablet; Take 1 tablet (0.25 mg total) by mouth at bedtime as needed for anxiety.  Dispense: 30 tablet; Refill: 5  4. Body mass index 36.0-36.9, adult - phentermine 37.5 MG capsule; Take 1 capsule (37.5 mg total) by mouth daily.  Dispense: 30 capsule; Refill: 3   Start time: 7:56 AM End time: 8:09 AM  Meds ordered this encounter  Medications  . atorvastatin (LIPITOR) 20 MG tablet    Sig: Take 1 tablet (20 mg total) by mouth daily.    Dispense:  90 tablet    Refill:  3    Order Specific Question:   Supervising Provider    Answer:   Raliegh Ip [[id number]]  . Suvorexant (BELSOMRA) 20 MG TABS    Sig: Take 1 tablet by mouth at bedtime.    Dispense:  90 tablet    Refill:  1    This request is for a  new prescription for a controlled substance as required by Federal/State law.    Order Specific Question:   Supervising Provider    Answer:   Raliegh IpGOTTSCHALK, ASHLY M [[id number]]  . tolterodine (DETROL LA) 4 MG 24 hr capsule    Sig: Take 1 capsule (4 mg total) by mouth daily.    Dispense:  90 capsule    Refill:  3    Order Specific Question:   Supervising Provider    Answer:   Raliegh IpGOTTSCHALK, ASHLY M [[id number]]  . venlafaxine XR (EFFEXOR-XR) 75 MG 24 hr capsule    Sig: TAKE 1 CAPSULE BY MOUTH EVERY DAY    Dispense:  90 capsule    Refill:  3    Order Specific Question:   Supervising Provider    Answer:   Raliegh IpGOTTSCHALK, ASHLY M [[id number]]  . ALPRAZolam (XANAX) 0.25 MG tablet    Sig: Take 1 tablet (0.25 mg total) by mouth at bedtime as needed  for anxiety.    Dispense:  30 tablet    Refill:  5    Order Specific Question:   Supervising Provider    Answer:   Raliegh IpGOTTSCHALK, ASHLY M [[id number]]  . phentermine 37.5 MG capsule    Sig: Take 1 capsule (37.5 mg total) by mouth daily.    Dispense:  30 capsule    Refill:  3    Not to exceed 3 additional fills before 07/15/2017    Order Specific Question:   Supervising Provider    Answer:   Raliegh IpGOTTSCHALK, ASHLY M [[id number]]    Prudy FeelerAngel Carliss Quast PA-C [facility] [phone]

## 2018-08-21 ENCOUNTER — Ambulatory Visit: Payer: Self-pay | Admitting: Physical Therapy

## 2018-08-28 ENCOUNTER — Ambulatory Visit (HOSPITAL_COMMUNITY): Payer: Self-pay | Admitting: Occupational Therapy

## 2018-08-28 ENCOUNTER — Encounter (HOSPITAL_COMMUNITY): Payer: Self-pay

## 2018-08-28 ENCOUNTER — Telehealth (HOSPITAL_COMMUNITY): Payer: Self-pay | Admitting: Physician Assistant

## 2018-08-28 NOTE — Telephone Encounter (Signed)
08/28/18  Verlon Au not here today due to emergency

## 2018-08-29 ENCOUNTER — Ambulatory Visit (HOSPITAL_COMMUNITY): Payer: Medicare Other | Attending: Specialist | Admitting: Specialist

## 2018-08-29 DIAGNOSIS — R29898 Other symptoms and signs involving the musculoskeletal system: Secondary | ICD-10-CM | POA: Insufficient documentation

## 2018-08-29 DIAGNOSIS — M25531 Pain in right wrist: Secondary | ICD-10-CM | POA: Insufficient documentation

## 2018-08-29 DIAGNOSIS — M25631 Stiffness of right wrist, not elsewhere classified: Secondary | ICD-10-CM | POA: Insufficient documentation

## 2018-09-18 ENCOUNTER — Encounter (HOSPITAL_COMMUNITY): Payer: Self-pay | Admitting: Occupational Therapy

## 2018-09-18 ENCOUNTER — Ambulatory Visit (HOSPITAL_COMMUNITY): Payer: Medicare Other | Admitting: Occupational Therapy

## 2018-09-18 ENCOUNTER — Other Ambulatory Visit: Payer: Self-pay

## 2018-09-18 DIAGNOSIS — M25531 Pain in right wrist: Secondary | ICD-10-CM

## 2018-09-18 DIAGNOSIS — M25631 Stiffness of right wrist, not elsewhere classified: Secondary | ICD-10-CM | POA: Diagnosis present

## 2018-09-18 DIAGNOSIS — R29898 Other symptoms and signs involving the musculoskeletal system: Secondary | ICD-10-CM | POA: Diagnosis present

## 2018-09-18 NOTE — Patient Instructions (Signed)
AROM Exercises: *Complete exercises __10-15____ times each, ___2-3____ times per day*  1) Wrist Flexion  Start with wrist at edge of table, palm facing up. With wrist hanging slightly off table, curl wrist upward, and back down.      2) Wrist Extension  Start with wrist at edge of table, palm facing down. With wrist slightly off the edge of the table, curl wrist up and back down.      3) Radial Deviations  Start with forearm flat against a table, wrist hanging slightly off the edge, and palm facing the wall. Bending at the wrist only, and keeping palm facing the wall, bend wrist so fist is pointing towards the floor, back up to start position, and up towards the ceiling. Return to start.        4) WRIST PRONATION  Turn your forearm towards palm face down.  Keep your elbow bent and by the side of your  Body.      5) WRIST SUPINATION  Turn your forearm towards palm face up.  Keep your elbow bent and by the side of your  Body.       Home Exercises Program Theraputty Exercises  Do the following exercises 2 times a day using your affected hand.  1. Roll putty into a ball.  2. Make into a pancake.  3. Roll putty into a roll.  4. Pinch along log with first finger and thumb.   5. Make into a ball.  6. Roll it back into a log.   7. Pinch using thumb and side of first finger.  8. Roll into a ball, then flatten into a pancake.  9. Using your fingers, make putty into a mountain.

## 2018-09-18 NOTE — Therapy (Signed)
[facility] [address] [place], [place], [address] Phone: [phone]   Fax:  [phone]  Occupational Therapy Evaluation  Patient Details  Name: Sabrina Stein MRN: [id number] Date of Birth: 30-Jun-1950 Referring Provider (OT): Dr. Candi LeashJames Creighton   Encounter Date: 09/18/2018  OT End of Session - 09/18/18 1356    Visit Number  1    Number of Visits  8    Date for OT Re-Evaluation  10/18/18    Authorization Type  [employer] Medicare    Authorization Time Period  no visit limit, no pt responsibility    OT Start Time  1310    OT Stop Time  1348    OT Time Calculation (min)  38 min       Past Medical History:  Diagnosis Date  . Depression   . Hyperlipidemia     Past Surgical History:  Procedure Laterality Date  . APPENDECTOMY    . CESAREAN SECTION      There were no vitals filed for this visit.  Subjective Assessment - 09/18/18 1331    Subjective   S: I have tingling and pain pretty much daily.    Pertinent History  Pt is a 68 y/o female s/p right radial wrist fx secondary to fall on 05/31/2018. Pt is 16 weeks out from fx with healed fx and has no precautions. Pt was referred to occupational therapy for evaluation and treatment by Dr. Candi LeashJames Creighton.    Special Tests  DASH: 50    Patient Stated Goals  To have less pain and be stronger in my wrist.    Currently in Pain?  Yes    Pain Score  4     Pain Location  Wrist    Pain Orientation  Right    Pain Descriptors / Indicators  Aching;Sore    Pain Type  Acute pain    Pain Radiating Towards  elbow    Pain Onset  More than a month ago    Pain Frequency  Intermittent    Aggravating Factors   certain movements, use    Pain Relieving Factors  rest, massaging area    Effect of Pain on Daily Activities  mod effect on ADLs    Multiple Pain Sites  No        [facility] OT Assessment - 09/18/18 1303      Assessment   Medical Diagnosis  s/p right distal radius fx    Referring Provider (OT)   Dr. Candi LeashJames Creighton    Onset Date/Surgical Date  05/31/18    Hand Dominance  Right    Next MD Visit  09/20/2018    Prior Therapy  None      Precautions   Precautions  None      Restrictions   Weight Bearing Restrictions  No      Balance Screen   Has the patient fallen in the past 6 months  Yes    How many times?  1    Has the patient had a decrease in activity level because of a fear of falling?   No    Is the patient reluctant to leave their home because of a fear of falling?   No      Prior Function   Level of Independence  Independent    Vocation  Retired    Leisure  sewing, quilting, smocking, drawing, paint      ADL   ADL comments  Pt is having with putting  pocketbook over shoulder, daily numbness and pain, difficulty with brushing/fixing hair, is unable to pick up any weighted objects, unable to open bottles/containers. Pt has discomfort with turning steering wheel, completing crafting projects, difficulty with sewing tasks. Has increased pain when pushing up out of the bathtub.       Written Expression   Dominant Hand  Right      Observation/Other Assessments   Quick DASH   50      Edema   Edema  wrist crease: right-17cm, left 16cm       ROM / Strength   AROM / PROM / Strength  AROM;PROM;Strength      Palpation   Palpation comment  Min fascial restrictions along radial styloid region of dorsal and volar forearm      AROM   AROM Assessment Site  Wrist;Forearm    Right/Left Forearm  Right    Right Forearm Pronation  90 Degrees    Right Forearm Supination  60 Degrees    Right/Left Wrist  Right    Right Wrist Extension  40 Degrees    Right Wrist Flexion  35 Degrees    Right Wrist Radial Deviation  20 Degrees    Right Wrist Ulnar Deviation  40 Degrees      PROM   PROM Assessment Site  Wrist;Forearm    Right/Left Forearm  Right    Right Forearm Pronation  90 Degrees    Right Forearm Supination  80 Degrees    Right/Left Wrist  Right    Right Wrist Extension   60 Degrees    Right Wrist Flexion  45 Degrees    Right Wrist Radial Deviation  20 Degrees    Right Wrist Ulnar Deviation  40 Degrees      Strength   Strength Assessment Site  Wrist;Hand;Forearm    Right/Left Forearm  Right    Right Forearm Pronation  3/5    Right Forearm Supination  3-/5    Right/Left Wrist  Right    Right Wrist Flexion  3+/5    Right Wrist Extension  4/5    Right Wrist Radial Deviation  4/5    Right Wrist Ulnar Deviation  5/5    Right/Left hand  Right;Left    Right Hand Gross Grasp  Functional    Right Hand Grip (lbs)  24    Right Hand Lateral Pinch  7 lbs    Right Hand 3 Point Pinch  6 lbs    Left Hand Gross Grasp  Functional    Left Hand Grip (lbs)  50    Left Hand Lateral Pinch  12 lbs    Left Hand 3 Point Pinch  11 lbs          Katina Dung - 09/18/18 1328    Open a tight or new jar  Unable    Do heavy household chores (wash walls, wash floors)  Unable    Carry a shopping bag or briefcase  Mild difficulty    Wash your back  Moderate difficulty    Use a knife to cut food  No difficulty    Recreational activities in which you take some force or impact through your arm, shoulder, or hand (golf, hammering, tennis)  Unable    During the past week, to what extent has your arm, shoulder or hand problem interfered with your normal social activities with family, friends, neighbors, or groups?  Not at all    During the past week, to what extent has your  arm, shoulder or hand problem limited your work or other regular daily activities  Slightly    Arm, shoulder, or hand pain.  Moderate    Tingling (pins and needles) in your arm, shoulder, or hand  Severe    Difficulty Sleeping  Mild difficulty    DASH Score  50 %                 OT Education - 09/18/18 1337    Education Details  wrist A/ROM, red theraputty exercises    Person(s) Educated  Patient    Methods  Explanation;Demonstration;Handout    Comprehension  Verbalized understanding;Returned  demonstration       OT Short Term Goals - 09/18/18 1400      OT SHORT TERM GOAL #1   Title  Pt will be provided with and educated on HEP to improve right wrist mobility for ADL completion using RUE as dominant.    Time  4    Period  Weeks    Status  New    Target Date  10/18/18      OT SHORT TERM GOAL #2   Title  Pt will decrease pain in right wrist to 3/10 or less to improve ability to sleep uninterrupted by wrist discomfort.    Time  4    Period  Weeks    Status  New      OT SHORT TERM GOAL #3   Title  Pt will improve right wrist and forearm ROM to WNL to improve ability to manipulate items/objects used for ADLs without compensatory techniques.    Time  4    Period  Weeks    Status  New      OT SHORT TERM GOAL #4   Title  Pt will increase right wrist strength to 4+/5 or greater to improve ability to push up from the bathtub using bilateral hands.    Time  4    Period  Weeks    Status  New      OT SHORT TERM GOAL #5   Title  Pt will increase grip strength by 15# and pinch strength by 4# to improve ability to hold onto items during cooking tasks.    Time  4    Period  Weeks    Status  New               Plan - 09/18/18 1356    Clinical Impression Statement  A: Pt is a 68 y/o female s/p right distal radial head fx presenting with limitations in use of RUE as dominant due to pain, weakness, and ROM limitations. Pt is no longer wearing any brace/splint and is using her right wrist as much as possible during the day.    OT Occupational Profile and History  Problem Focused Assessment - Including review of records relating to presenting problem    Occupational performance deficits (Please refer to evaluation for details):  ADL's;IADL's;Rest and Sleep;Leisure;Social Participation    Body Structure / Function / Physical Skills  ADL;UE functional use;Fascial restriction;Pain;ROM;IADL;Strength    Rehab Potential  Good    Clinical Decision Making  Limited treatment options,  no task modification necessary    Comorbidities Affecting Occupational Performance:  None    Modification or Assistance to Complete Evaluation   No modification of tasks or assist necessary to complete eval    OT Frequency  2x / week    OT Duration  4 weeks    OT Treatment/Interventions  Self-care/ADL  training;Ultrasound;Patient/family education;Passive range of motion;Cryotherapy;Electrical Stimulation;Moist Heat;Therapeutic exercise;Manual Therapy;Therapeutic activities    Plan  P: Pt willl benefit from skilled OT services to decrease pain and fascial restrictions, increase ROM, strength, and functional use of right wrist and hand during ADL tasks. Treatment plan: myofascial release, manual techniques, P/ROM, A/ROM, general wrist and forearm strengthening, grip and pinch strengthening    Consulted and Agree with Plan of Care  Patient       Patient will benefit from skilled therapeutic intervention in order to improve the following deficits and impairments:   Body Structure / Function / Physical Skills: ADL, UE functional use, Fascial restriction, Pain, ROM, IADL, Strength       Visit Diagnosis: 1. Pain in right wrist   2. Stiffness of right wrist, not elsewhere classified   3. Other symptoms and signs involving the musculoskeletal system       Problem List Patient Active Problem List   Diagnosis Date Noted  . Mild episode of recurrent major depressive disorder (HCC) 08/07/2018  . Hyperglycemia 06/05/2016  . Primary insomnia 06/05/2016  . Mixed hyperlipidemia 06/05/2016  . Body mass index 36.0-36.9, adult 12/06/2015   Ezra SitesLeslie Marleny Faller, OTR/L  [phone] 09/18/2018, 2:04 PM  [facility] [address] [place], [place], [address] Phone: [phone]   Fax:  [phone]  Name: Sabrina Stein MRN: [id number] Date of Birth: 1950/12/03

## 2018-09-19 ENCOUNTER — Encounter (HOSPITAL_COMMUNITY): Payer: Self-pay | Admitting: Occupational Therapy

## 2018-09-19 ENCOUNTER — Ambulatory Visit (HOSPITAL_COMMUNITY): Payer: Medicare Other | Admitting: Occupational Therapy

## 2018-09-19 DIAGNOSIS — M25631 Stiffness of right wrist, not elsewhere classified: Secondary | ICD-10-CM

## 2018-09-19 DIAGNOSIS — R29898 Other symptoms and signs involving the musculoskeletal system: Secondary | ICD-10-CM

## 2018-09-19 DIAGNOSIS — M25531 Pain in right wrist: Secondary | ICD-10-CM

## 2018-09-19 NOTE — Therapy (Signed)
[place], [place], [address] Phone: [phone]   Fax:  [phone]  Occupational Therapy Treatment  Patient Details  Name: Sabrina Stein MRN: [id number] Date of Birth: 05-Feb-1951 Referring Provider (OT): Dr. Harlin Heys   Encounter Date: 09/19/2018  OT End of Session - 09/19/18 1051    Visit Number  2    Number of Visits  8    Date for OT Re-Evaluation  10/18/18    Authorization Type  [employer] Medicare    Authorization Time Period  no visit limit, no pt responsibility    OT Start Time  1010    OT Stop Time  1049    OT Time Calculation (min)  39 min       Past Medical History:  Diagnosis Date  . Depression   . Hyperlipidemia     Past Surgical History:  Procedure Laterality Date  . APPENDECTOMY    . CESAREAN SECTION      There were no vitals filed for this visit.  Subjective Assessment - 09/19/18 1009    Subjective   S: It's a little bit sore.    Currently in Pain?  Yes    Pain Score  4     Pain Location  Wrist    Pain Orientation  Right    Pain Descriptors / Indicators  Sore    Pain Type  Acute pain    Pain Radiating Towards  n/a    Pain Onset  Yesterday    Pain Frequency  Intermittent    Aggravating Factors   certain movements    Pain Relieving Factors  rest    Effect of Pain on Daily Activities  min effect on ADLs         [facility] OT Assessment - 09/19/18 1009      Assessment   Medical Diagnosis  s/p right distal radius fx      Precautions   Precautions  None               OT Treatments/Exercises (OP) - 09/19/18 1013      Exercises   Exercises  Wrist;Hand;Elbow      Elbow Exercises   Forearm Supination  PROM;5 reps;AROM;10 reps      Additional Elbow Exercises   Hand Gripper with Large Beads  all beads gripper at 20#    Hand Gripper with Medium Beads  all beads gripper at 20#    Hand Gripper with Small Beads  all beads gripper at 20#      Weighted Stretch Over Towel  Roll   Supination - Weighted Stretch  2 pounds;60 seconds   2x   Wrist Flexion - Weighted Stretch  3 pounds;60 seconds   2x   Wrist Extension - Weighted Stretch  3 pounds;60 seconds   2x     Wrist Exercises   Wrist Flexion  PROM;AROM;10 reps    Wrist Extension  PROM;AROM;10 reps      Additional Wrist Exercises   Sponges  Pt used red clothespin to stack 4 piles of 5 sponges into towers. Pt used 3 point pinch for task. Pt then placed all 20 sponges into bucket using lateral pinch, min difficulty.                OT Short Term Goals - 09/19/18 1053      OT SHORT TERM GOAL #1   Title  Pt will be provided with and educated on HEP to improve right  wrist mobility for ADL completion using RUE as dominant.    Time  4    Period  Weeks    Status  On-going    Target Date  10/18/18      OT SHORT TERM GOAL #2   Title  Pt will decrease pain in right wrist to 3/10 or less to improve ability to sleep uninterrupted by wrist discomfort.    Time  4    Period  Weeks    Status  On-going      OT SHORT TERM GOAL #3   Title  Pt will improve right wrist and forearm ROM to WNL to improve ability to manipulate items/objects used for ADLs without compensatory techniques.    Time  4    Period  Weeks    Status  On-going      OT SHORT TERM GOAL #4   Title  Pt will increase right wrist strength to 4+/5 or greater to improve ability to push up from the bathtub using bilateral hands.    Time  4    Period  Weeks    Status  On-going      OT SHORT TERM GOAL #5   Title  Pt will increase grip strength by 15# and pinch strength by 4# to improve ability to hold onto items during cooking tasks.    Time  4    Period  Weeks    Status  On-going               Plan - 09/19/18 1051    Clinical Impression Statement  A: Pt reports she completed her HEP and flattening the theraputty was the most difficult. Initiated passive stretching, A/ROM, grip and pinch strengthening this session. Pt resistant to  passive stretching, requiring verbal cues to relax wrist and allow for stretch. Increased time for gripper task. Verbal cuing for form and technique during session.    Body Structure / Function / Physical Skills  ADL;UE functional use;Fascial restriction;Pain;ROM;IADL;Strength    Plan  P: Continue with passive stretches and weighted stretches, add weighted stretches to HEP. Add theraputty activities-flatten, grip, pinch       Patient will benefit from skilled therapeutic intervention in order to improve the following deficits and impairments:   Body Structure / Function / Physical Skills: ADL, UE functional use, Fascial restriction, Pain, ROM, IADL, Strength       Visit Diagnosis: 1. Pain in right wrist   2. Stiffness of right wrist, not elsewhere classified   3. Other symptoms and signs involving the musculoskeletal system       Problem List Patient Active Problem List   Diagnosis Date Noted  . Mild episode of recurrent major depressive disorder (HCC) 08/07/2018  . Hyperglycemia 06/05/2016  . Primary insomnia 06/05/2016  . Mixed hyperlipidemia 06/05/2016  . Body mass index 36.0-36.9, adult 12/06/2015   Ezra SitesLeslie Lucas Winograd, OTR/L  [phone] 09/19/2018, 10:54 AM  [facility] [address] [place], [place], [address] Phone: [phone]   Fax:  [phone]  Name: Jiles Garterancy Boley MRN: [id number] Date of Birth: 1951/01/27

## 2018-09-24 ENCOUNTER — Encounter (HOSPITAL_COMMUNITY): Payer: Self-pay

## 2018-09-24 ENCOUNTER — Ambulatory Visit (HOSPITAL_COMMUNITY): Payer: Medicare Other

## 2018-09-24 ENCOUNTER — Other Ambulatory Visit: Payer: Self-pay

## 2018-09-24 DIAGNOSIS — M25531 Pain in right wrist: Secondary | ICD-10-CM

## 2018-09-24 DIAGNOSIS — R29898 Other symptoms and signs involving the musculoskeletal system: Secondary | ICD-10-CM

## 2018-09-24 DIAGNOSIS — M25631 Stiffness of right wrist, not elsewhere classified: Secondary | ICD-10-CM

## 2018-09-24 NOTE — Patient Instructions (Signed)
Hold each stretch for 1 minute and complete 2 times. 1-2 times a day.  supination stretch  Hold the weight in your hand and let it stretch your forearm as you turn your hand in an outwards direction.    WRIST EXTENSION STRETCH - FREE WEIGHT  Start by holding a small free weight with the weight and your arm rested on a table.   Next, move your arm so that your wrist is over the edge of the table with palm pointed upward.   Then, let your wrist bend back and downwards towards the floor as shown.   Allow gravity to stretch your wrist as this position is maintained.  Return to starting position with your arm and the weight resting on the table and then repeat.        WRIST FLEXION STRETCH - FREE WEIGHT  Start by holding a small free weight with the weight and your arm rested on a table.   Next, move your arm so that your wrist is over the edge of the table with palm pointed downward.  Then, let your wrist bend downwards towards the floor as shown.   Allow gravity to stretch your wrist as this position is maintained.  Return to starting position with your arm and the weight resting on the table and then repeat.

## 2018-09-24 NOTE — Therapy (Signed)
[place], [place], [address] Phone: [phone]   Fax:  [phone]  Occupational Therapy Treatment  Patient Details  Name: Sabrina Stein MRN: [id number] Date of Birth: 1951/02/06 Referring Provider (OT): Dr. Harlin Heys   Encounter Date: 09/24/2018  OT End of Session - 09/24/18 1256    Visit Number  3    Number of Visits  8    Date for OT Re-Evaluation  10/18/18    Authorization Type  [employer] Medicare    Authorization Time Period  no visit limit, no pt responsibility    OT Start Time  1135    OT Stop Time  1215    OT Time Calculation (min)  40 min    Activity Tolerance  Patient tolerated treatment well    Behavior During Therapy  [facility] for tasks assessed/performed       Past Medical History:  Diagnosis Date  . Depression   . Hyperlipidemia     Past Surgical History:  Procedure Laterality Date  . APPENDECTOMY    . CESAREAN SECTION      There were no vitals filed for this visit.  Subjective Assessment - 09/24/18 1157    Subjective   S: I was really sore after last session.    Currently in Pain?  No/denies         [facility] OT Assessment - 09/24/18 1158      Assessment   Medical Diagnosis  s/p right distal radius fx      Precautions   Precautions  None               OT Treatments/Exercises (OP) - 09/24/18 1142      Exercises   Exercises  Wrist;Hand;Elbow;Theraputty      Elbow Exercises   Forearm Supination  PROM;10 reps      Additional Elbow Exercises   Theraputty - Flatten  red- standing    Theraputty - Roll  red    Theraputty - Grip  red      Weighted Stretch Over Towel Roll   Supination - Weighted Stretch  2 pounds;60 seconds   2X   Wrist Flexion - Weighted Stretch  3 pounds;60 seconds   2X   Wrist Extension - Weighted Stretch  3 pounds;60 seconds   2X     Wrist Exercises   Wrist Flexion  PROM;10 reps    Wrist Extension  PROM;10 reps    Other wrist exercises  Prayer  stretch at table; 2x20"      Hand Exercises   Other Hand Exercises  PVC pipe used to cut circles into red putty focusing on wrist flexion and extension and grip strength.       Theraputty   Theraputty - Pinch  red- 3 point and lateral             OT Education - 09/24/18 1151    Education Details  weighted wrist stretch. Discussed using 2# and 3# hand weights. If they are unavailable suggested using a tote bag with items that are close to 2-3#.    Person(s) Educated  Patient    Methods  Explanation;Demonstration;Handout    Comprehension  Returned demonstration;Verbalized understanding       OT Short Term Goals - 09/19/18 1053      OT SHORT TERM GOAL #1   Title  Pt will be provided with and educated on HEP to improve right wrist mobility for ADL completion using RUE as dominant.  Time  4    Period  Weeks    Status  On-going    Target Date  10/18/18      OT SHORT TERM GOAL #2   Title  Pt will decrease pain in right wrist to 3/10 or less to improve ability to sleep uninterrupted by wrist discomfort.    Time  4    Period  Weeks    Status  On-going      OT SHORT TERM GOAL #3   Title  Pt will improve right wrist and forearm ROM to WNL to improve ability to manipulate items/objects used for ADLs without compensatory techniques.    Time  4    Period  Weeks    Status  On-going      OT SHORT TERM GOAL #4   Title  Pt will increase right wrist strength to 4+/5 or greater to improve ability to push up from the bathtub using bilateral hands.    Time  4    Period  Weeks    Status  On-going      OT SHORT TERM GOAL #5   Title  Pt will increase grip strength by 15# and pinch strength by 4# to improve ability to hold onto items during cooking tasks.    Time  4    Period  Weeks    Status  On-going               Plan - 09/24/18 1256    Clinical Impression Statement  A: pt reports increased soreness after last session. At the end of this session, recommended applying  ice when she returns home to help with soreness. Added wrist stretch with weight to HEP. VC for form and technique were provided. Pt took rest breaks due to soreness or fatigue as needed.    Body Structure / Function / Physical Skills  ADL;UE functional use;Fascial restriction;Pain;ROM;IADL;Strength    Plan  P: Continue with grip strengthening using gripper. Increase resistance as able.    Consulted and Agree with Plan of Care  Patient       Patient will benefit from skilled therapeutic intervention in order to improve the following deficits and impairments:   Body Structure / Function / Physical Skills: ADL, UE functional use, Fascial restriction, Pain, ROM, IADL, Strength       Visit Diagnosis: 1. Other symptoms and signs involving the musculoskeletal system   2. Pain in right wrist   3. Stiffness of right wrist, not elsewhere classified       Problem List Patient Active Problem List   Diagnosis Date Noted  . Mild episode of recurrent major depressive disorder (HCC) 08/07/2018  . Hyperglycemia 06/05/2016  . Primary insomnia 06/05/2016  . Mixed hyperlipidemia 06/05/2016  . Body mass index 36.0-36.9, adult 12/06/2015   Sabrina Stein, OTR/L,CBIS  [phone]  09/24/2018, 1:02 PM  [facility] [address] [place], [place], [address] Phone: [phone]   Fax:  [phone]  Name: Sabrina Stein MRN: [id number] Date of Birth: [birth year]

## 2018-10-02 ENCOUNTER — Encounter (HOSPITAL_COMMUNITY): Payer: Self-pay

## 2018-10-02 ENCOUNTER — Other Ambulatory Visit: Payer: Self-pay

## 2018-10-02 ENCOUNTER — Ambulatory Visit (HOSPITAL_COMMUNITY): Payer: Medicare Other | Attending: Specialist

## 2018-10-02 DIAGNOSIS — M25531 Pain in right wrist: Secondary | ICD-10-CM | POA: Diagnosis present

## 2018-10-02 DIAGNOSIS — M25631 Stiffness of right wrist, not elsewhere classified: Secondary | ICD-10-CM | POA: Diagnosis not present

## 2018-10-02 DIAGNOSIS — R29898 Other symptoms and signs involving the musculoskeletal system: Secondary | ICD-10-CM | POA: Diagnosis present

## 2018-10-02 NOTE — Therapy (Signed)
[place], [place], [address] Phone: [phone]   Fax:  [phone]  Occupational Therapy Treatment  Patient Details  Name: Sabrina Stein MRN: [id number] Date of Birth: [birth year] Referring Provider (OT): Dr. Harlin Heys   Encounter Date: 10/02/2018  OT End of Session - 10/02/18 1458    Visit Number  4    Number of Visits  8    Date for OT Re-Evaluation  10/18/18    Authorization Type  [employer] Medicare    Authorization Time Period  no visit limit, no pt responsibility    OT Start Time  1335    OT Stop Time  1418    OT Time Calculation (min)  43 min    Activity Tolerance  Patient tolerated treatment well    Behavior During Therapy  [facility] for tasks assessed/performed       Past Medical History:  Diagnosis Date  . Depression   . Hyperlipidemia     Past Surgical History:  Procedure Laterality Date  . APPENDECTOMY    . CESAREAN SECTION      There were no vitals filed for this visit.  Subjective Assessment - 10/02/18 1350    Subjective   S: I can't open a jar.    Currently in Pain?  Yes    Pain Score  4     Pain Location  Wrist    Pain Orientation  Right    Pain Descriptors / Indicators  Sore    Pain Type  Acute pain    Pain Radiating Towards  N/A    Pain Onset  In the past 7 days    Aggravating Factors   supination and prontation    Pain Relieving Factors  rest    Effect of Pain on Daily Activities  min effect on ADLs    Multiple Pain Sites  No         [facility] OT Assessment - 10/02/18 1352      Assessment   Medical Diagnosis  s/p right distal radius fx      Precautions   Precautions  None               OT Treatments/Exercises (OP) - 10/02/18 1352      Exercises   Exercises  Wrist;Hand;Elbow;Theraputty      Additional Elbow Exercises   Hand Gripper with Large Beads  all beads gripper at 25#    Hand Gripper with Medium Beads  all beads gripper at 25#    Hand Gripper with Small  Beads  all beads gripper at 25#      Weighted Stretch Over Towel Roll   Supination - Weighted Stretch  3 pounds;60 seconds   2X   Wrist Flexion - Weighted Stretch  3 pounds;60 seconds   2X   Wrist Extension - Weighted Stretch  3 pounds;60 seconds   2X     Wrist Exercises   Wrist Flexion  PROM;5 reps    Wrist Extension  PROM;5 reps      Hand Exercises   Other Hand Exercises  Used PVC pipe handle in red putty while twisting it right and left to mimic opening the lid from a jar.       Manual Therapy   Manual Therapy  Myofascial release    Manual therapy comments  Manual therapy completed prior to exercises.     Myofascial Release  Myofascial release and manual stretching completed to right wrist and forearm to  decrease fascial restrictions and increase joint mobility in a pain free zone.                OT Short Term Goals - 09/19/18 1053      OT SHORT TERM GOAL #1   Title  Pt will be provided with and educated on HEP to improve right wrist mobility for ADL completion using RUE as dominant.    Time  4    Period  Weeks    Status  On-going    Target Date  10/18/18      OT SHORT TERM GOAL #2   Title  Pt will decrease pain in right wrist to 3/10 or less to improve ability to sleep uninterrupted by wrist discomfort.    Time  4    Period  Weeks    Status  On-going      OT SHORT TERM GOAL #3   Title  Pt will improve right wrist and forearm ROM to WNL to improve ability to manipulate items/objects used for ADLs without compensatory techniques.    Time  4    Period  Weeks    Status  On-going      OT SHORT TERM GOAL #4   Title  Pt will increase right wrist strength to 4+/5 or greater to improve ability to push up from the bathtub using bilateral hands.    Time  4    Period  Weeks    Status  On-going      OT SHORT TERM GOAL #5   Title  Pt will increase grip strength by 15# and pinch strength by 4# to improve ability to hold onto items during cooking tasks.    Time  4     Period  Weeks    Status  On-going               Plan - 10/02/18 1500    Clinical Impression Statement  A: Pt was able to increase handgripper resistance to 25#. Presented to clinic with reports of soreness in wrist and forearm during supination and pronation. After manual techniques were completed no soreness was present.    Body Structure / Function / Physical Skills  ADL;UE functional use;Fascial restriction;Pain;ROM;IADL;Strength    Plan  P: Continue with pvc handle in putty to work on functional performance when opening jars.       Patient will benefit from skilled therapeutic intervention in order to improve the following deficits and impairments:   Body Structure / Function / Physical Skills: ADL, UE functional use, Fascial restriction, Pain, ROM, IADL, Strength       Visit Diagnosis: 1. Stiffness of right wrist, not elsewhere classified   2. Pain in right wrist   3. Other symptoms and signs involving the musculoskeletal system       Problem List Patient Active Problem List   Diagnosis Date Noted  . Mild episode of recurrent major depressive disorder (HCC) 08/07/2018  . Hyperglycemia 06/05/2016  . Primary insomnia 06/05/2016  . Mixed hyperlipidemia 06/05/2016  . Body mass index 36.0-36.9, adult 12/06/2015   Limmie PatriciaLaura Alysse Rathe, OTR/L,CBIS  [phone]  10/02/2018, 3:02 PM  [facility] [address] [place], [place], [address] Phone: [phone]   Fax:  [phone]  Name: Sabrina Stein MRN: [id number] Date of Birth: [birth year]

## 2018-10-04 ENCOUNTER — Other Ambulatory Visit: Payer: Self-pay

## 2018-10-04 ENCOUNTER — Ambulatory Visit (HOSPITAL_COMMUNITY): Payer: Medicare Other

## 2018-10-04 ENCOUNTER — Encounter (HOSPITAL_COMMUNITY): Payer: Self-pay

## 2018-10-04 DIAGNOSIS — M25631 Stiffness of right wrist, not elsewhere classified: Secondary | ICD-10-CM | POA: Diagnosis not present

## 2018-10-04 DIAGNOSIS — M25531 Pain in right wrist: Secondary | ICD-10-CM

## 2018-10-04 DIAGNOSIS — R29898 Other symptoms and signs involving the musculoskeletal system: Secondary | ICD-10-CM

## 2018-10-04 NOTE — Therapy (Signed)
[facility] [address] [place], [place], [address] Phone: [phone]   Fax:  [phone]  Occupational Therapy Treatment  Patient Details  Name: Sabrina Stein MRN: [id number] Date of Birth: [birth year] Referring Provider (OT): Dr. Candi LeashJames Creighton   Encounter Date: 10/04/2018  OT End of Session - 10/04/18 1107    Visit Number  5    Number of Visits  8    Date for OT Re-Evaluation  10/18/18    Authorization Type  [employer] Medicare    Authorization Time Period  no visit limit, no pt responsibility    OT Start Time  1021    OT Stop Time  1100    OT Time Calculation (min)  39 min    Activity Tolerance  Patient tolerated treatment well    Behavior During Therapy  [facility] tasks assessed/performed       Past Medical History:  Diagnosis Date  . Depression   . Hyperlipidemia     Past Surgical History:  Procedure Laterality Date  . APPENDECTOMY    . CESAREAN SECTION      There were no vitals filed for this visit.  Subjective Assessment - 10/04/18 1032    Subjective   S: It's the most comfortable it has felt. I think that massage you did last time really helped.    Currently in Pain?  No/denies         [facility] - 10/04/18 1032      Assessment   Medical Diagnosis  s/p right distal radius fx      Precautions   Precautions  None               OT Treatments/Exercises (OP) - 10/04/18 1032      Exercises   Exercises  Wrist;Hand;Elbow;Theraputty      Elbow Exercises   Forearm Supination  Strengthening;10 reps    Bar Weights/Barbell (Forearm Supination)  2 lbs    Forearm Pronation  Strengthening;10 reps    Bar Weights/Barbell (Forearm Pronation)  2 lbs      Additional Elbow Exercises   Theraputty - Flatten  red- standing    Theraputty - Roll  red    Theraputty - Grip  red      Wrist Exercises   Wrist Flexion  AROM;5 reps;Strengthening;10 reps   Pt provided a little extra stretch with her left hand.   Bar Weights/Barbell (Wrist Flexion)  2 lbs    Wrist Extension  AROM;5 reps;Strengthening;10 reps   Pt provided a little extra stretch with her left hand.   Bar Weights/Barbell (Wrist Extension)  2 lbs    Wrist Radial Deviation  Strengthening;10 reps    Bar Weights/Barbell (Radial Deviation)  2 lbs    Wrist Ulnar Deviation  Strengthening;10 reps    Bar Weights/Barbell (Ulnar Deviation)  2 lbs      Additional Wrist Exercises   Sponges  patient utilized the green resistive clothespin and a 3 point pinch to pick up 30 sponges and place on container lid with increased time.      Hand Exercises   Other Hand Exercises  Used PVC pipe handle in red putty while twisting it right and left to mimic opening the lid from a jar.       Theraputty   Theraputty - Pinch  red- 3 point and lateral      Manual Therapy   Manual Therapy  Myofascial release    Manual therapy comments  Manual therapy  completed prior to exercises.     Myofascial Release  Myofascial release and manual stretching completed to right wrist and forearm to decrease fascial restrictions and increase joint mobility in a pain free zone.                OT Short Term Goals - 09/19/18 1053      OT SHORT TERM GOAL #1   Title  Pt will be provided with and educated on HEP to improve right wrist mobility for ADL completion using RUE as dominant.    Time  4    Period  Weeks    Status  On-going    Target Date  10/18/18      OT SHORT TERM GOAL #2   Title  Pt will decrease pain in right wrist to 3/10 or less to improve ability to sleep uninterrupted by wrist discomfort.    Time  4    Period  Weeks    Status  On-going      OT SHORT TERM GOAL #3   Title  Pt will improve right wrist and forearm ROM to WNL to improve ability to manipulate items/objects used for ADLs without compensatory techniques.    Time  4    Period  Weeks    Status  On-going      OT SHORT TERM GOAL #4   Title  Pt will increase right wrist strength to 4+/5 or  greater to improve ability to push up from the bathtub using bilateral hands.    Time  4    Period  Weeks    Status  On-going      OT SHORT TERM GOAL #5   Title  Pt will increase grip strength by 15# and pinch strength by 4# to improve ability to hold onto items during cooking tasks.    Time  4    Period  Weeks    Status  On-going               Plan - 10/04/18 1108    Clinical Impression Statement  A: Pt reports increased comfort after manual techniques last session. Completed myofascial release at start of session to assess fascial restrictions. Patient with min amount noted on medial region of forearm proximal to wrist.    Body Structure / Function / Physical Skills  ADL;UE functional use;Fascial restriction;Pain;ROM;IADL;Strength    Plan  P: Continue with handgripper while increasing resistance if able.    Consulted and Agree with Plan of Care  Patient       Patient will benefit from skilled therapeutic intervention in order to improve the following deficits and impairments:   Body Structure / Function / Physical Skills: ADL, UE functional use, Fascial restriction, Pain, ROM, IADL, Strength       Visit Diagnosis: 1. Stiffness of right wrist, not elsewhere classified   2. Pain in right wrist   3. Other symptoms and signs involving the musculoskeletal system       Problem List Patient Active Problem List   Diagnosis Date Noted  . Mild episode of recurrent major depressive disorder ([place]) 08/07/2018  . Hyperglycemia 06/05/2016  . Primary insomnia 06/05/2016  . Mixed hyperlipidemia 06/05/2016  . Body mass index 36.0-36.9, adult 12/06/2015   Sabrina Stein, OTR/L,CBIS  [phone]  10/04/2018, 11:10 AM  [place], [place], [address] Phone: [phone]   Fax:  [phone]  Name: Sabrina Stein MRN: [id number] Date of Birth: 1951-02-07

## 2018-10-08 ENCOUNTER — Other Ambulatory Visit: Payer: Self-pay

## 2018-10-08 ENCOUNTER — Encounter (HOSPITAL_COMMUNITY): Payer: Self-pay

## 2018-10-08 ENCOUNTER — Ambulatory Visit (HOSPITAL_COMMUNITY): Payer: Medicare Other

## 2018-10-08 DIAGNOSIS — M25531 Pain in right wrist: Secondary | ICD-10-CM

## 2018-10-08 DIAGNOSIS — M25631 Stiffness of right wrist, not elsewhere classified: Secondary | ICD-10-CM

## 2018-10-08 DIAGNOSIS — R29898 Other symptoms and signs involving the musculoskeletal system: Secondary | ICD-10-CM

## 2018-10-08 NOTE — Therapy (Signed)
[facility] [address] [place], [place], [address] Phone: [phone]   Fax:  [phone]  Occupational Therapy Treatment  Patient Details  Name: Sabrina Stein Brunker MRN: [id number] Date of Birth: 02-Oct-1950 Referring Provider (OT): Dr. Candi LeashJames Creighton   Encounter Date: 10/08/2018  OT End of Session - 10/08/18 1150    Visit Number  6    Number of Visits  8    Date for OT Re-Evaluation  10/18/18    Authorization Type  UHC Medicare    Authorization Time Period  no visit limit, no pt responsibility    OT Start Time  1135    OT Stop Time  1215    OT Time Calculation (min)  40 min    Activity Tolerance  Patient tolerated treatment well    Behavior During Therapy  [facility] for tasks assessed/performed       Past Medical History:  Diagnosis Date  . Depression   . Hyperlipidemia     Past Surgical History:  Procedure Laterality Date  . APPENDECTOMY    . CESAREAN SECTION      There were no vitals filed for this visit.  Subjective Assessment - 10/08/18 1148    Subjective   S: It was sore this weekend. I was doing more with it and the dog jerked it.    Currently in Pain?  Yes    Pain Score  6     Pain Location  Wrist    Pain Orientation  Right    Pain Descriptors / Indicators  Sore    Pain Type  Acute pain    Pain Radiating Towards  N/A    Pain Onset  In the past 7 days    Pain Frequency  Constant    Aggravating Factors   using it more and the dog jerked it.    Pain Relieving Factors  rest, massage    Effect of Pain on Daily Activities  min effect    Multiple Pain Sites  No         [facility] OT Assessment - 10/08/18 1150      Assessment   Medical Diagnosis  s/p right distal radius fx      Precautions   Precautions  None               OT Treatments/Exercises (OP) - 10/08/18 1151      Exercises   Exercises  Wrist;Hand;Elbow;Theraputty      Additional Elbow Exercises   UBE (Upper Arm Bike)  level 2 2' forward 2'  reverse    pace; 6.0-7.0   Wall Pushups/Modified Pushups  10X    Hand Gripper with Large Beads  all beads with gripper set at 29#   horizontal   Hand Gripper with Medium Beads  all beads with gripper set at 39#   horizontal   Hand Gripper with Small Beads  all beads with gripper set at 29#   horizontal     Wrist Exercises   Wrist Flexion  AROM;5 reps;Strengthening;15 reps    Bar Weights/Barbell (Wrist Flexion)  2 lbs    Wrist Extension  AROM;5 reps;Strengthening;15 reps    Bar Weights/Barbell (Wrist Extension)  2 lbs    Wrist Radial Deviation  Strengthening;15 reps    Bar Weights/Barbell (Radial Deviation)  2 lbs    Wrist Ulnar Deviation  Strengthening;15 reps    Bar Weights/Barbell (Ulnar Deviation)  2 lbs    Other wrist exercises  Wrist stretches; 1set; 10  second hold: flexion, extension, supination, pronation      Manual Therapy   Manual Therapy  Myofascial release    Manual therapy comments  Manual therapy completed prior to exercises.     Myofascial Release  Myofascial release and manual stretching completed to right wrist and forearm to decrease fascial restrictions and increase joint mobility in a pain free zone.                OT Short Term Goals - 09/19/18 1053      OT SHORT TERM GOAL #1   Title  Pt will be provided with and educated on HEP to improve right wrist mobility for ADL completion using RUE as dominant.    Time  4    Period  Weeks    Status  On-going    Target Date  10/18/18      OT SHORT TERM GOAL #2   Title  Pt will decrease pain in right wrist to 3/10 or less to improve ability to sleep uninterrupted by wrist discomfort.    Time  4    Period  Weeks    Status  On-going      OT SHORT TERM GOAL #3   Title  Pt will improve right wrist and forearm ROM to WNL to improve ability to manipulate items/objects used for ADLs without compensatory techniques.    Time  4    Period  Weeks    Status  On-going      OT SHORT TERM GOAL #4   Title  Pt will  increase right wrist strength to 4+/5 or greater to improve ability to push up from the bathtub using bilateral hands.    Time  4    Period  Weeks    Status  On-going      OT SHORT TERM GOAL #5   Title  Pt will increase grip strength by 15# and pinch strength by 4# to improve ability to hold onto items during cooking tasks.    Time  4    Period  Weeks    Status  On-going               Plan - 10/08/18 1218    Clinical Impression Statement  A: added wall push ups with patienting increased pain at end of decent. added UBE bike to increase grip strength and wrist strength. Manual techniques completed to address fascial restrictions with good results. VC for form and technique.    Body Structure / Function / Physical Skills  ADL;UE functional use;Fascial restriction;Pain;ROM;IADL;Strength    Plan  P: Continue with wrist strengthening.    Consulted and Agree with Plan of Care  Patient       Patient will benefit from skilled therapeutic intervention in order to improve the following deficits and impairments:   Body Structure / Function / Physical Skills: ADL, UE functional use, Fascial restriction, Pain, ROM, IADL, Strength       Visit Diagnosis: 1. Stiffness of right wrist, not elsewhere classified   2. Pain in right wrist   3. Other symptoms and signs involving the musculoskeletal system       Problem List Patient Active Problem List   Diagnosis Date Noted  . Mild episode of recurrent major depressive disorder ([place]) 08/07/2018  . Hyperglycemia 06/05/2016  . Primary insomnia 06/05/2016  . Mixed hyperlipidemia 06/05/2016  . Body mass index 36.0-36.9, adult 12/06/2015   Ailene Ravel, OTR/L,CBIS  [phone]  10/08/2018, 12:20 PM  [place]  [facility] [address] [place], [place], [address] Phone: [phone]   Fax:  [phone]  Name: Sabrina Stein Sabrina Stein MRN: [id number] Date of Birth: 1951-01-31

## 2018-10-10 ENCOUNTER — Ambulatory Visit (HOSPITAL_COMMUNITY): Payer: Medicare Other

## 2018-10-10 ENCOUNTER — Encounter (HOSPITAL_COMMUNITY): Payer: Self-pay

## 2018-10-10 ENCOUNTER — Other Ambulatory Visit: Payer: Self-pay

## 2018-10-10 DIAGNOSIS — M25531 Pain in right wrist: Secondary | ICD-10-CM

## 2018-10-10 DIAGNOSIS — M25631 Stiffness of right wrist, not elsewhere classified: Secondary | ICD-10-CM

## 2018-10-10 DIAGNOSIS — R29898 Other symptoms and signs involving the musculoskeletal system: Secondary | ICD-10-CM

## 2018-10-10 NOTE — Therapy (Signed)
[facility] [address] [place], [place], [address] Phone: [phone]   Fax:  [phone]  Occupational Therapy Treatment  Patient Details  Name: Sabrina Stein MRN: [id number] Date of Birth: 1950-11-14 Referring Provider (OT): Dr. Candi LeashJames Creighton   Encounter Date: 10/10/2018  OT End of Session - 10/10/18 1124    Visit Number  7    Number of Visits  8    Date for OT Re-Evaluation  10/18/18    Authorization Type  [employer]HC Medicare    Authorization Time Period  no visit limit, no pt responsibility    OT Start Time  1034    OT Stop Time  1115    OT Time Calculation (min)  41 min    Activity Tolerance  Patient tolerated treatment well    Behavior During Therapy  [facility] for tasks assessed/performed       Past Medical History:  Diagnosis Date  . Depression   . Hyperlipidemia     Past Surgical History:  Procedure Laterality Date  . APPENDECTOMY    . CESAREAN SECTION      There were no vitals filed for this visit.  Subjective Assessment - 10/10/18 1109    Subjective   S: I was doing a lot more with it yesterday so it's more sore today. I do things now without even thinking about it though which is great!    Currently in Pain?  Yes    Pain Score  5     Pain Location  Wrist    Pain Orientation  Right    Pain Descriptors / Indicators  Sore    Pain Type  Acute pain         [facility] OT Assessment - 10/10/18 1109      Assessment   Medical Diagnosis  s/p right distal radius fx      Precautions   Precautions  None               OT Treatments/Exercises (OP) - 10/10/18 1110      Exercises   Exercises  Wrist;Hand;Elbow;Theraputty      Additional Elbow Exercises   Theraputty - Flatten  red    Theraputty - Grip  red- 3 point      Wrist Exercises   Other wrist exercises  Cy[place]Jenga game completed with patient using her right whand only in order to isolate her wrist while completing flexion and extension when manipulating game  pieces.       Hand Exercises   Other Hand Exercises  Used PVC pipe handle in red putty while twisting it right and left to mimic opening the lid from a jar.       Manual Therapy   Manual Therapy  Myofascial release    Manual therapy comments  Manual therapy completed prior to exercises.     Myofascial Release  Myofascial release and manual stretching completed to right wrist and forearm to decrease fascial restrictions and increase joint mobility in a pain free zone.                OT Short Term Goals - 09/19/18 1053      OT SHORT TERM GOAL #1   Title  Pt will be provided with and educated on HEP to improve right wrist mobility for ADL completion using RUE as dominant.    Time  4    Period  Weeks    Status  On-going    Target Date  10/18/18  OT SHORT TERM GOAL #2   Title  Pt will decrease pain in right wrist to 3/10 or less to improve ability to sleep uninterrupted by wrist discomfort.    Time  4    Period  Weeks    Status  On-going      OT SHORT TERM GOAL #3   Title  Pt will improve right wrist and forearm ROM to WNL to improve ability to manipulate items/objects used for ADLs without compensatory techniques.    Time  4    Period  Weeks    Status  On-going      OT SHORT TERM GOAL #4   Title  Pt will increase right wrist strength to 4+/5 or greater to improve ability to push up from the bathtub using bilateral hands.    Time  4    Period  Weeks    Status  On-going      OT SHORT TERM GOAL #5   Title  Pt will increase grip strength by 15# and pinch strength by 4# to improve ability to hold onto items during cooking tasks.    Time  4    Period  Weeks    Status  On-going               Plan - 10/10/18 1313    Clinical Impression Statement  A: Started session with functional game of JENGO to focus on active wrist flexion and extension. No difficulty with movement. Progressed to 3# handweights to continue focusing on strengthening. patient reports that she  is able to "feel it" in the distal radius region although able to tolerate to complete.    Body Structure / Function / Physical Skills  ADL;UE functional use;Fascial restriction;Pain;ROM;IADL;Strength    Plan  P: Continue with wrist strengthening. Increase resistance on handgripper.    Consulted and Agree with Plan of Care  Patient       Patient will benefit from skilled therapeutic intervention in order to improve the following deficits and impairments:   Body Structure / Function / Physical Skills: ADL, UE functional use, Fascial restriction, Pain, ROM, IADL, Strength       Visit Diagnosis: 1. Pain in right wrist   2. Other symptoms and signs involving the musculoskeletal system   3. Stiffness of right wrist, not elsewhere classified       Problem List Patient Active Problem List   Diagnosis Date Noted  . Mild episode of recurrent major depressive disorder ([place]) 08/07/2018  . Hyperglycemia 06/05/2016  . Primary insomnia 06/05/2016  . Mixed hyperlipidemia 06/05/2016  . Body mass index 36.0-36.9, adult 12/06/2015   Ailene Ravel, OTR/L,CBIS  [phone]  10/10/2018, 1:18 PM  [place] [address] [place], [place], [address] Phone: [phone]   Fax:  [phone]  Name: Albirda Shiel MRN: [id number] Date of Birth: 1950/06/13

## 2018-10-16 ENCOUNTER — Other Ambulatory Visit: Payer: Self-pay

## 2018-10-16 ENCOUNTER — Encounter (HOSPITAL_COMMUNITY): Payer: Self-pay | Admitting: Occupational Therapy

## 2018-10-16 ENCOUNTER — Ambulatory Visit (HOSPITAL_COMMUNITY): Payer: Medicare Other | Admitting: Occupational Therapy

## 2018-10-16 DIAGNOSIS — M25631 Stiffness of right wrist, not elsewhere classified: Secondary | ICD-10-CM

## 2018-10-16 DIAGNOSIS — R29898 Other symptoms and signs involving the musculoskeletal system: Secondary | ICD-10-CM

## 2018-10-16 DIAGNOSIS — M25531 Pain in right wrist: Secondary | ICD-10-CM

## 2018-10-16 NOTE — Patient Instructions (Signed)
1) Wrist extension: -Fix the band firmly under your foot and hold the other end in your affected hand/arm.  -Place your elbow over your knee and let your affected wrist hang towards the floor, palm down.  -Lift your wrist and hand up while keeping forearm on your knee/table    2) Forearm supination -Fix band firmly under foot, hold other end in affected hand/arm.  -Place elbow on knee, with affected hand/arm palm up.  -Turn affected hand/arm to turn to a palm up position    3) Pronation -Fix band firmly under foot, hold other end in affected hand/arm.  -Place elbow on knee, with affected hand/arm palm down.  -Turn affected hand/arm to turn to a palm down position    4) Wrist Flexion  Rest your forearm on your thigh or table. Place one end of the band on your foot and hold another loop with your palm in the face up position. Raise and lower your hand bending at the wrist as shown.    5) Ulnar Deviation While holding an elastic band the wrist towards the side as shown.  Hold one end of the elastic band with one and stabilize by holding the band about 1 foot away.   Move your target wrist side to side as shown.     6) Radial Deviation Rest your forearm on your thigh or table.  Next, while holding an elastic band, bend your wrist upwards with your wrist in a neutral position as shown.

## 2018-10-16 NOTE — Therapy (Signed)
[place], [place], [address] Phone: [phone]   Fax:  [phone]  Occupational Therapy Reassessment, Treatment, and Discharge Summary  Patient Details  Name: Sabrina Stein MRN: [id number] Date of Birth: 10-04-1950 Referring Provider (OT): Dr. Harlin Heys   Encounter Date: 10/16/2018  OT End of Session - 10/16/18 1347    Visit Number  8    Number of Visits  8    Date for OT Re-Evaluation  10/18/18    Authorization Type  [employer]HC Medicare    Authorization Time Period  no visit limit, no pt responsibility    OT Start Time  1315    OT Stop Time  1346    OT Time Calculation (min)  31 min    Activity Tolerance  Patient tolerated treatment well    Behavior During Therapy  [facility] for tasks assessed/performed       Past Medical History:  Diagnosis Date  . Depression   . Hyperlipidemia     Past Surgical History:  Procedure Laterality Date  . APPENDECTOMY    . CESAREAN SECTION      There were no vitals filed for this visit.  Subjective Assessment - 10/16/18 1314    Subjective   S: I can grip and twist things better now.    Currently in Pain?  No/denies         [facility] OT Assessment - 10/16/18 1314      Assessment   Medical Diagnosis  s/p right distal radius fx      Precautions   Precautions  None      Observation/Other Assessments   Quick DASH   11.36   50 previous     AROM   Right Forearm Pronation  90 Degrees   same as previous   Right Forearm Supination  90 Degrees   60 previous   Right Wrist Extension  55 Degrees   40 previous   Right Wrist Flexion  60 Degrees   35 previous   Right Wrist Radial Deviation  30 Degrees   20 previous   Right Wrist Ulnar Deviation  40 Degrees   same as previous     PROM   Right Wrist Extension  65 Degrees   60 previous   Right Wrist Flexion  60 Degrees   45 previous   Right Wrist Radial Deviation  30 Degrees   20 previous   Right Wrist Ulnar Deviation   40 Degrees   same as previous     Strength   Right Forearm Pronation  5/5   3/5 previous   Right Forearm Supination  5/5   3-/5 previous   Right Wrist Flexion  5/5   3+/5 previous   Right Wrist Extension  5/5   4/5 previous   Right Wrist Radial Deviation  4+/5   4/5 previous   Right Wrist Ulnar Deviation  5/5   same as previous   Right Hand Grip (lbs)  45   24 previous   Right Hand Lateral Pinch  12 lbs   7 previous   Right Hand 3 Point Pinch  9 lbs   6 previous        Quick Dash - 10/16/18 1315    Open a tight or new jar  No difficulty    Do heavy household chores (wash walls, wash floors)  No difficulty    Carry a shopping bag or briefcase  No difficulty    Wash your back  Mild difficulty    Use a knife to cut food  No difficulty    Recreational activities in which you take some force or impact through your arm, shoulder, or hand (golf, hammering, tennis)  No difficulty    During the past week, to what extent has your arm, shoulder or hand problem interfered with your normal social activities with family, friends, neighbors, or groups?  Not at all    During the past week, to what extent has your arm, shoulder or hand problem limited your work or other regular daily activities  Slightly    Arm, shoulder, or hand pain.  Mild    Tingling (pins and needles) in your arm, shoulder, or hand  Moderate    Difficulty Sleeping  No difficulty    DASH Score  11.36 %           OT Treatments/Exercises (OP) - 10/16/18 1328      Exercises   Exercises  Wrist;Hand;Elbow;Theraputty      Elbow Exercises   Forearm Supination  Theraband;10 reps    Theraband Level (Supination)  Level 2 (Red)    Forearm Pronation  Theraband;10 reps    Theraband Level (Pronation)  Level 2 (Red)      Additional Elbow Exercises   Hand Gripper with Large Beads  all beads with gripper set at 29#   horizontal   Hand Gripper with Medium Beads  all beads with gripper set at 39#   horizontal   Hand  Gripper with Small Beads  all beads with gripper set at 29#   horizontal     Wrist Exercises   Wrist Flexion  Theraband;10 reps    Theraband Level (Wrist Flexion)  Level 2 (Red)    Wrist Extension  Theraband;10 reps    Theraband Level (Wrist Extension)  Level 2 (Red)    Wrist Radial Deviation  Theraband;10 reps    Theraband Level (Radial Deviation)  Level 2 (Red)    Wrist Ulnar Deviation  Theraband;10 reps    Theraband Level (Ulnar Deviation)  Level 2 (Red)             OT Education - 10/16/18 1347    Education Details  red theraband wrist strengthening    Person(s) Educated  Patient    Methods  Explanation;Demonstration;Handout    Comprehension  Returned demonstration;Verbalized understanding       OT Short Term Goals - 10/16/18 1326      OT SHORT TERM GOAL #1   Title  Pt will be provided with and educated on HEP to improve right wrist mobility for ADL completion using RUE as dominant.    Time  4    Period  Weeks    Status  Achieved    Target Date  10/18/18      OT SHORT TERM GOAL #2   Title  Pt will decrease pain in right wrist to 3/10 or less to improve ability to sleep uninterrupted by wrist discomfort.    Time  4    Period  Weeks    Status  Achieved      OT SHORT TERM GOAL #3   Title  Pt will improve right wrist and forearm ROM to WNL to improve ability to manipulate items/objects used for ADLs without compensatory techniques.    Time  4    Period  Weeks    Status  Achieved      OT SHORT TERM GOAL #4   Title  Pt will increase right  wrist strength to 4+/5 or greater to improve ability to push up from the bathtub using bilateral hands.    Time  4    Period  Weeks    Status  Achieved      OT SHORT TERM GOAL #5   Title  Pt will increase grip strength by 15# and pinch strength by 4# to improve ability to hold onto items during cooking tasks.    Time  4    Period  Weeks    Status  Partially Met               Plan - 10/16/18 1347    Clinical  Impression Statement  A: Reassessment completed this session, pt has met 4/5 goals and partially met remaining goal. Pt has made great progress in wrist ROM and strength and well as grip and pinch strength. Pt reports she is now using her hand without thinking about it and only experiences soreness if she has used her hand and wrist a lot that day. Pt provided with updated wrist strengthening HEP and demonstrates good form. Pt is agreeable to discharge with HEP.    Body Structure / Function / Physical Skills  ADL;UE functional use;Fascial restriction;Pain;ROM;IADL;Strength    Plan  P: Discharge pt       Patient will benefit from skilled therapeutic intervention in order to improve the following deficits and impairments:   Body Structure / Function / Physical Skills: ADL, UE functional use, Fascial restriction, Pain, ROM, IADL, Strength       Visit Diagnosis: 1. Pain in right wrist   2. Other symptoms and signs involving the musculoskeletal system   3. Stiffness of right wrist, not elsewhere classified       Problem List Patient Active Problem List   Diagnosis Date Noted  . Mild episode of recurrent major depressive disorder ([place]) 08/07/2018  . Hyperglycemia 06/05/2016  . Primary insomnia 06/05/2016  . Mixed hyperlipidemia 06/05/2016  . Body mass index 36.0-36.9, adult 12/06/2015   Guadelupe Sabin, OTR/L  [phone] 10/16/2018, 1:50 PM  [place] [address] [place], [place], [address] Phone: [phone]   Fax:  [phone]  Name: Sabrina Stein MRN: [id number] Date of Birth: 1950/08/21   OCCUPATIONAL THERAPY DISCHARGE SUMMARY  Visits from Start of Care: 8  Current functional level related to goals / functional outcomes: See above. Pt has met 4/5 goals and partially met remaining goal. Pt has made great progress and reports she is now using her hand without thinking about it. Pt is pleased with progress and is agreeable to  discharge with HEP.    Remaining deficits: Occasional fatigue and soreness   Education / Equipment: HEP for wrist strengthening Plan: Patient agrees to discharge.  Patient goals were met. Patient is being discharged due to meeting the stated rehab goals.  ?????

## 2018-10-17 ENCOUNTER — Ambulatory Visit (HOSPITAL_COMMUNITY): Payer: Medicare Other

## 2018-10-22 ENCOUNTER — Ambulatory Visit (HOSPITAL_COMMUNITY): Payer: Medicare Other

## 2018-10-24 ENCOUNTER — Ambulatory Visit (HOSPITAL_COMMUNITY): Payer: Medicare Other

## 2018-12-23 ENCOUNTER — Encounter: Payer: Self-pay | Admitting: Physician Assistant

## 2018-12-24 ENCOUNTER — Encounter: Payer: Self-pay | Admitting: Physician Assistant

## 2019-02-06 ENCOUNTER — Other Ambulatory Visit: Payer: Self-pay

## 2019-02-07 ENCOUNTER — Ambulatory Visit (INDEPENDENT_AMBULATORY_CARE_PROVIDER_SITE_OTHER): Payer: Medicare Other | Admitting: Physician Assistant

## 2019-02-07 ENCOUNTER — Encounter: Payer: Self-pay | Admitting: Physician Assistant

## 2019-02-07 VITALS — BP 126/83 | HR 72 | Temp 97.2°F | Ht 62.0 in | Wt 205.2 lb

## 2019-02-07 DIAGNOSIS — G2581 Restless legs syndrome: Secondary | ICD-10-CM

## 2019-02-07 DIAGNOSIS — E782 Mixed hyperlipidemia: Secondary | ICD-10-CM | POA: Diagnosis not present

## 2019-02-07 DIAGNOSIS — Z0000 Encounter for general adult medical examination without abnormal findings: Secondary | ICD-10-CM

## 2019-02-07 DIAGNOSIS — F330 Major depressive disorder, recurrent, mild: Secondary | ICD-10-CM | POA: Diagnosis not present

## 2019-02-07 DIAGNOSIS — F5101 Primary insomnia: Secondary | ICD-10-CM

## 2019-02-07 DIAGNOSIS — Z23 Encounter for immunization: Secondary | ICD-10-CM

## 2019-02-07 MED ORDER — BELSOMRA 20 MG PO TABS: 1.0000 | ORAL_TABLET | Freq: Every day | ORAL | 1 refills | Status: DC

## 2019-02-07 MED ORDER — ROPINIROLE HCL 0.25 MG PO TABS: 0.2500 mg | ORAL_TABLET | Freq: Every day | ORAL | 5 refills | Status: DC

## 2019-02-07 MED ORDER — ALPRAZOLAM 0.25 MG PO TABS: 0.2500 mg | ORAL_TABLET | Freq: Every evening | ORAL | 5 refills | Status: DC | PRN

## 2019-02-08 LAB — TSH: TSH: 1.33 u[IU]/mL (ref 0.450–4.500)

## 2019-02-08 LAB — CMP14+EGFR
ALT: 24 IU/L (ref 0–32)
AST: 20 IU/L (ref 0–40)
Albumin/Globulin Ratio: 1.7 (ref 1.2–2.2)
Albumin: 4 g/dL (ref 3.8–4.8)
Alkaline Phosphatase: 134 IU/L — ABNORMAL HIGH (ref 39–117)
BUN/Creatinine Ratio: 16 (ref 12–28)
BUN: 14 mg/dL (ref 8–27)
Bilirubin Total: 0.4 mg/dL (ref 0.0–1.2)
CO2: 24 mmol/L (ref 20–29)
Calcium: 9.4 mg/dL (ref 8.7–10.3)
Chloride: 104 mmol/L (ref 96–106)
Creatinine, Ser: 0.89 mg/dL (ref 0.57–1.00)
GFR calc Af Amer: 77 mL/min/1.73 (ref >59)
GFR calc non Af Amer: 67 mL/min/1.73 (ref >59)
Globulin, Total: 2.3 g/dL (ref 1.5–4.5)
Glucose: 102 mg/dL — ABNORMAL HIGH (ref 65–99)
Potassium: 4.4 mmol/L (ref 3.5–5.2)
Sodium: 139 mmol/L (ref 134–144)
Total Protein: 6.3 g/dL (ref 6.0–8.5)

## 2019-02-08 LAB — CBC WITH DIFFERENTIAL/PLATELET
Basophils Absolute: 0.1 10*3/uL (ref 0.0–0.2)
Basos: 1 %
EOS (ABSOLUTE): 0.2 10*3/uL (ref 0.0–0.4)
Eos: 2 %
Hematocrit: 37.5 % (ref 34.0–46.6)
Hemoglobin: 12.8 g/dL (ref 11.1–15.9)
Immature Grans (Abs): 0 10*3/uL (ref 0.0–0.1)
Immature Granulocytes: 0 %
Lymphocytes Absolute: 1.8 10*3/uL (ref 0.7–3.1)
Lymphs: 29 %
MCH: 31.8 pg (ref 26.6–33.0)
MCHC: 34.1 g/dL (ref 31.5–35.7)
MCV: 93 fL (ref 79–97)
Monocytes Absolute: 0.5 10*3/uL (ref 0.1–0.9)
Monocytes: 7 %
Neutrophils Absolute: 3.9 10*3/uL (ref 1.4–7.0)
Neutrophils: 61 %
Platelets: 311 10*3/uL (ref 150–450)
RBC: 4.03 x10E6/uL (ref 3.77–5.28)
RDW: 12.7 % (ref 11.7–15.4)
WBC: 6.3 10*3/uL (ref 3.4–10.8)

## 2019-02-08 LAB — LIPID PANEL
Chol/HDL Ratio: 2.7 ratio (ref 0.0–4.4)
Cholesterol, Total: 148 mg/dL (ref 100–199)
HDL: 55 mg/dL (ref >39)
LDL Chol Calc (NIH): 71 mg/dL (ref 0–99)
Triglycerides: 128 mg/dL (ref 0–149)
VLDL Cholesterol Cal: 22 mg/dL (ref 5–40)

## 2019-02-11 ENCOUNTER — Ambulatory Visit (INDEPENDENT_AMBULATORY_CARE_PROVIDER_SITE_OTHER): Payer: Medicare Other | Admitting: *Deleted

## 2019-02-11 DIAGNOSIS — Z0000 Encounter for general adult medical examination without abnormal findings: Secondary | ICD-10-CM | POA: Diagnosis not present

## 2019-02-11 NOTE — Progress Notes (Signed)
MEDICARE ANNUAL WELLNESS VISIT  02/11/2019  Telephone Visit Disclaimer This Medicare AWV was conducted by telephone due to national recommendations for restrictions regarding the COVID-19 Pandemic (e.g. social distancing).  I verified, using two identifiers, that I am speaking with Sabrina GarterNancy Delcastillo or their authorized healthcare agent. I discussed the limitations, risks, security, and privacy concerns of performing an evaluation and management service by telephone and the potential availability of an in-person appointment in the future. The patient expressed understanding and agreed to proceed.   Subjective:  Sabrina Garterancy Oubre is a 68 y.o. female patient of Remus LofflerJones, [name] S, PA-C who had a Medicare Annual Wellness Visit today via telephone. Harriett Sineancy is Retired and lives with their spouse and 2 dogs. she has 2 children. she reports that she is socially active and does interact with friends/family regularly. she is moderately physically active and enjoys reading, quilting, painting and is a member of an embroidery group that meets once a month.  Patient Care Team: Caryl NeverJones, [name] S, PA-C as PCP - General (Physician Assistant)  Advanced Directives 02/11/2019 09/18/2018 02/07/2018 01/25/2017  Does Patient Have a Medical Advance Directive? Yes Yes Yes Yes  Type of Estate agentAdvance Directive Healthcare Power of [place]Attorney;Living will Healthcare Power of [place]orney;Living will Healthcare Power of [place]orney;Living will Living will;Healthcare Power of Attorney  Does patient want to make changes to medical advance directive? No - Patient declined - No - Patient declined No - Patient declined  Copy of Healthcare Power of Attorney in Chart? No - copy requested No - copy requested No - copy requested No - copy requested    Hospital Utilization Over the Past 12 Months: # of hospitalizations or ER visits: 1 # of surgeries: 0  Review of Systems    Patient reports that her overall health is unchanged compared to last year.   History obtained from chart review  Patient Reported Readings (BP, Pulse, CBG, Weight, etc) none  Pain Assessment Pain : No/denies pain     Current Medications & Allergies (verified) Allergies as of 02/11/2019      Reactions   Codeine Nausea And Vomiting      Medication List       Accurate as of February 11, 2019  9:01 AM. If you have any questions, ask your nurse or doctor.        ALPRAZolam 0.25 MG tablet Commonly known as: XANAX Take 1 tablet (0.25 mg total) by mouth at bedtime as needed for anxiety.   atorvastatin 20 MG tablet Commonly known as: LIPITOR Take 1 tablet (20 mg total) by mouth daily.   Belsomra 20 MG Tabs Generic drug: Suvorexant Take 1 tablet by mouth at bedtime.   CALCIUM 1200 PO Take by mouth.   phentermine 37.5 MG capsule Take 1 capsule (37.5 mg total) by mouth daily.   rOPINIRole 0.25 MG tablet Commonly known as: Requip Take 1-2 tablets (0.25-0.5 mg total) by mouth at bedtime.   tolterodine 4 MG 24 hr capsule Commonly known as: DETROL LA Take 1 capsule (4 mg total) by mouth daily.   venlafaxine XR 75 MG 24 hr capsule Commonly known as: EFFEXOR-XR TAKE 1 CAPSULE BY MOUTH EVERY DAY   vitamin C 1000 MG tablet Take 1,000 mg by mouth daily.   Vitamin D3 1.25 MG (50000 UT) Caps Take by mouth.       History (reviewed): Past Medical History:  Diagnosis Date  . Depression   . Hyperlipidemia    Past Surgical History:  Procedure Laterality Date  .  APPENDECTOMY    . CESAREAN SECTION     x2   Family History  Problem Relation Age of Onset  . Cancer Father        lung  . Healthy Sister   . Healthy Daughter   . Healthy Son    Social History   Socioeconomic History  . Marital status: Married    Spouse name: Molly Maduro  . Number of children: 2  . Years of education: 23  . Highest education level: Master's degree (e.g., MA, MS, MEng, MEd, MSW, MBA)  Occupational History  . Occupation: Retired   Engineer, production  . Financial  resource strain: Not hard at all  . Food insecurity    Worry: Never true    Inability: Never true  . Transportation needs    Medical: No    Non-medical: No  Tobacco Use  . Smoking status: Former Smoker    Packs/day: 1.00    Years: 43.00    Pack years: 43.00    Types: Cigarettes    Quit date: 01/26/2012    Years since quitting: 7.0  . Smokeless tobacco: Never Used  Substance and Sexual Activity  . Alcohol use: Yes    Comment: occassional-glass of wine once a month  . Drug use: No  . Sexual activity: Yes  Lifestyle  . Physical activity    Days per week: 7 days    Minutes per session: 30 min  . Stress: Only a little  Relationships  . Social connections    Talks on phone: More than three times a week    Gets together: More than three times a week    Attends religious service: Never    Active member of club or organization: Yes    Attends meetings of clubs or organizations: More than 4 times per year    Relationship status: Married  Other Topics Concern  . Not on file  Social History Narrative  . Not on file    Activities of Daily Living In your present state of health, do you have any difficulty performing the following activities: 02/11/2019  Hearing? Y  Comment has noticed lately her hearing isn't as good as it was when she was younger  Vision? N  Comment wears glasses all the time-gets yearly eye exam  Difficulty concentrating or making decisions? N  Walking or climbing stairs? N  Dressing or bathing? N  Doing errands, shopping? N  Preparing Food and eating ? N  Using the Toilet? N  In the past six months, have you accidently leaked urine? Y  Comment pt takes Detrol LA for this-mostly at night when she had to get up she wouldn't quite make it but the Detrol has helped with this  Do you have problems with loss of bowel control? N  Managing your Medications? N  Managing your Finances? N  Housekeeping or managing your Housekeeping? N  Some recent data might be  hidden    Patient Education/ Literacy How often do you need to have someone help you when you read instructions, pamphlets, or other written materials from your doctor or pharmacy?: 1 - Never What is the last grade level you completed in school?: Masters Degree-Elementary Education  Exercise Current Exercise Habits: Home exercise routine, Type of exercise: walking, Time (Minutes): 30, Frequency (Times/Week): 7, Weekly Exercise (Minutes/Week): 210, Intensity: Mild, Exercise limited by: None identified  Diet Patient reports consuming 2 meals a day and 3 snack(s) a day Patient reports that her primary diet is:  Regular Patient reports that she does have regular access to food.   Depression Screen PHQ 2/9 Scores 02/11/2019 02/07/2019 02/07/2018 02/06/2018 07/24/2017 01/25/2017 01/16/2017  PHQ - 2 Score 0 0 0 0 0 0 0  PHQ- 9 Score - 5 - - - - -     Fall Risk Fall Risk  02/11/2019 02/07/2019 02/07/2018 02/06/2018 07/24/2017  Falls in the past year? 1 1 0 0 No  Number falls in past yr: 0 0 - - -  Injury with Fall? 1 1 - - -  Follow up Falls prevention discussed Falls prevention discussed - - -  Comment Get rid of all throw rugs in the house, adequate lighting in the walkways and grab bars in the bathroom - - - -     Objective:  Sabrina Stein seemed alert and oriented and she participated appropriately during our telephone visit.  Blood Pressure Weight BMI  BP Readings from Last 3 Encounters:  02/07/19 126/83  02/07/18 131/85  02/06/18 134/81   Wt Readings from Last 3 Encounters:  02/07/19 205 lb 3.2 oz (93.1 kg)  02/07/18 195 lb (88.5 kg)  02/06/18 195 lb 9.6 oz (88.7 kg)   BMI Readings from Last 1 Encounters:  02/07/19 37.53 kg/m    *Unable to obtain current vital signs, weight, and BMI due to telephone visit type  Hearing/Vision  . Suriah did not seem to have difficulty with hearing/understanding during the telephone conversation . Reports that she has had a formal eye exam by  an eye care professional within the past year . Reports that she has not had a formal hearing evaluation within the past year *Unable to fully assess hearing and vision during telephone visit type  Cognitive Function: 6CIT Screen 02/11/2019  What Year? 0 points  What month? 0 points  What time? 0 points  Count back from 20 0 points  Months in reverse 0 points  Repeat phrase 0 points  Total Score 0   (Normal:0-7, Significant for Dysfunction: >8)  Normal Cognitive Function Screening: Yes   Immunization & Health Maintenance Record Immunization History  Administered Date(s) Administered  . Influenza, High Dose Seasonal PF 12/20/2015, 02/06/2018  . Influenza,inj,Quad PF,6+ Mos 01/16/2017  . Pneumococcal Conjugate-13 12/06/2015  . Pneumococcal Polysaccharide-23 02/07/2018  . Td 11/14/2011  . Zoster Recombinat (Shingrix) 01/25/2017, 02/07/2018    Health Maintenance  Topic Date Due  . Hepatitis C Screening  1950-05-23  . FOOT EXAM  10/12/1960  . OPHTHALMOLOGY EXAM  10/12/1960  . URINE MICROALBUMIN  10/12/1960  . MAMMOGRAM  10/12/2000  . COLONOSCOPY  10/12/2000  . HEMOGLOBIN A1C  07/25/2018  . INFLUENZA VACCINE  10/26/2018  . TETANUS/TDAP  11/13/2021  . DEXA SCAN  Completed  . PNA vac Low Risk Adult  Completed       Assessment  This is a routine wellness examination for Sabrina Stein.  Health Maintenance: Due or Overdue Health Maintenance Due  Topic Date Due  . Hepatitis C Screening  [birth year]  . FOOT EXAM  10/12/1960  . OPHTHALMOLOGY EXAM  10/12/1960  . URINE MICROALBUMIN  10/12/1960  . MAMMOGRAM  10/12/2000  . COLONOSCOPY  10/12/2000  . HEMOGLOBIN A1C  07/25/2018  . INFLUENZA VACCINE  10/26/2018    Sabrina Stein does not need a referral for Community Assistance: Care Management:   no Social Work:    no Prescription Assistance:  no Nutrition/Diabetes Education:  no   Plan:  Personalized Goals Goals Addressed  This Visit's Progress   . DIET  - INCREASE WATER INTAKE       Try to drink 6-8 glasses of water daily      Personalized Health Maintenance & Screening Recommendations  Screening mammography Colorectal cancer screening Advanced directives: has an advanced directive - a copy HAS NOT been provided.  Lung Cancer Screening Recommended: yes-declines at this time (Low Dose CT Chest recommended if Age 11-80 years, 30 pack-year currently smoking OR have quit w/in past 15 years) Hepatitis C Screening recommended: yes-will offer at next visit with PCP HIV Screening recommended: no  Advanced Directives: Written information was not prepared per patient's request.  Referrals & Orders No orders of the defined types were placed in this encounter.   Follow-up Plan . Follow-up with Terald Sleeper, PA-C as planned . Declined Colonoscopy and Mammogram-states she doesn't plan on ever doing either . Bring a copy of your Advanced directives in for our records   I have personally reviewed and noted the following in the patient's chart:   . Medical and social history . Use of alcohol, tobacco or illicit drugs  . Current medications and supplements . Functional ability and status . Nutritional status . Physical activity . Advanced directives . List of other physicians . Hospitalizations, surgeries, and ER visits in previous 12 months . Vitals . Screenings to include cognitive, depression, and falls . Referrals and appointments  In addition, I have reviewed and discussed with [name] certain preventive protocols, quality metrics, and best practice recommendations. A written personalized care plan for preventive services as well as general preventive health recommendations is available and can be mailed to the patient at her request.      Milas Hock, LPN  [id number]

## 2019-02-11 NOTE — Patient Instructions (Signed)
Preventive Care 68 Years and Older, Female Preventive care refers to lifestyle choices and visits with your health care provider that can promote health and wellness. This includes:  A yearly physical exam. This is also called an annual well check.  Regular dental and eye exams.  Immunizations.  Screening for certain conditions.  Healthy lifestyle choices, such as diet and exercise. What can I expect for my preventive care visit? Physical exam Your health care provider will check:  Height and weight. These may be used to calculate body mass index (BMI), which is a measurement that tells if you are at a healthy weight.  Heart rate and blood pressure.  Your skin for abnormal spots. Counseling Your health care provider may ask you questions about:  Alcohol, tobacco, and drug use.  Emotional well-being.  Home and relationship well-being.  Sexual activity.  Eating habits.  History of falls.  Memory and ability to understand (cognition).  Work and work Statistician.  Pregnancy and menstrual history. What immunizations do I need?  Influenza (flu) vaccine  This is recommended every year. Tetanus, diphtheria, and pertussis (Tdap) vaccine  You may need a Td booster every 10 years. Varicella (chickenpox) vaccine  You may need this vaccine if you have not already been vaccinated. Zoster (shingles) vaccine  You may need this after age 33. Pneumococcal conjugate (PCV13) vaccine  One dose is recommended after age 33. Pneumococcal polysaccharide (PPSV23) vaccine  One dose is recommended after age 72. Measles, mumps, and rubella (MMR) vaccine  You may need at least one dose of MMR if you were born in 1957 or later. You may also need a second dose. Meningococcal conjugate (MenACWY) vaccine  You may need this if you have certain conditions. Hepatitis A vaccine  You may need this if you have certain conditions or if you travel or work in places where you may be exposed  to hepatitis A. Hepatitis B vaccine  You may need this if you have certain conditions or if you travel or work in places where you may be exposed to hepatitis B. Haemophilus influenzae type b (Hib) vaccine  You may need this if you have certain conditions. You may receive vaccines as individual doses or as more than one vaccine together in one shot (combination vaccines). Talk with your health care provider about the risks and benefits of combination vaccines. What tests do I need? Blood tests  Lipid and cholesterol levels. These may be checked every 5 years, or more frequently depending on your overall health.  Hepatitis C test.  Hepatitis B test. Screening  Lung cancer screening. You may have this screening every year starting at age 39 if you have a 30-pack-year history of smoking and currently smoke or have quit within the past 15 years.  Colorectal cancer screening. All adults should have this screening starting at age 36 and continuing until age 15. Your health care provider may recommend screening at age 23 if you are at increased risk. You will have tests every 1-10 years, depending on your results and the type of screening test.  Diabetes screening. This is done by checking your blood sugar (glucose) after you have not eaten for a while (fasting). You may have this done every 1-3 years.  Mammogram. This may be done every 1-2 years. Talk with your health care provider about how often you should have regular mammograms.  BRCA-related cancer screening. This may be done if you have a family history of breast, ovarian, tubal, or peritoneal cancers.  Other tests  Sexually transmitted disease (STD) testing.  Bone density scan. This is done to screen for osteoporosis. You may have this done starting at age 76. Follow these instructions at home: Eating and drinking  Eat a diet that includes fresh fruits and vegetables, whole grains, lean protein, and low-fat dairy products. Limit  your intake of foods with high amounts of sugar, saturated fats, and salt.  Take vitamin and mineral supplements as recommended by your health care provider.  Do not drink alcohol if your health care provider tells you not to drink.  If you drink alcohol: ? Limit how much you have to 0-1 drink a day. ? Be aware of how much alcohol is in your drink. In the [place] one drink equals one 12 oz bottle of beer (355 mL), one 5 oz glass of wine (148 mL), or one 1 oz glass of hard liquor (44 mL). Lifestyle  Take daily care of your teeth and gums.  Stay active. Exercise for at least 30 minutes on 5 or more days each week.  Do not use any products that contain nicotine or tobacco, such as cigarettes, e-cigarettes, and chewing tobacco. If you need help quitting, ask your health care provider.  If you are sexually active, practice safe sex. Use a condom or other form of protection in order to prevent STIs (sexually transmitted infections).  Talk with your health care provider about taking a low-dose aspirin or statin. What's next?  Go to your health care provider once a year for a well check visit.  Ask your health care provider how often you should have your eyes and teeth checked.  Stay up to date on all vaccines. This information is not intended to replace advice given to you by your health care provider. Make sure you discuss any questions you have with your health care provider. Document Released: 04/09/2015 Document Revised: 03/07/2018 Document Reviewed: 03/07/2018 Elsevier Patient Education  2020 [employer].

## 2019-02-12 DIAGNOSIS — G2581 Restless legs syndrome: Secondary | ICD-10-CM | POA: Insufficient documentation

## 2019-02-12 NOTE — Progress Notes (Signed)
BP 126/83   Pulse 72   Temp (!) 97.2 F (36.2 C) (Temporal)   Ht '5\' 2"'$  (1.575 m)   Wt 205 lb 3.2 oz (93.1 kg)   SpO2 98%   BMI 37.53 kg/m    Subjective:    Patient ID: Sabrina Stein, female    DOB: [birth year], 68 y.o.   MRN: [id number]  HPI: Sabrina Stein is a 68 y.o. female presenting on 02/07/2019 for Medical Management of Chronic Issues (6 month follow up ) and Hyperlipidemia  This patient comes in for follow-up of her chronic medical conditions.  They do include hyperlipidemia, depression, insomnia, and what sounds like a diagnosis of restless legs.  She states that everything has been doing very well.  She is not had any Covid exposures and is doing very well.  She states that every evening when she sits down she will have jerking of her legs and she will have it when she sleeps at night.  She has never been treated for restless legs.  Think we will address that at this time.  The patient does have insomnia for a long time.  And has continued with the Belsomra at 20 mg at bedtime.  This is doing fairly well.  We will also plan for labs in the near future.  Past Medical History:  Diagnosis Date  . Depression   . Hyperlipidemia    Relevant past medical, surgical, family and social history reviewed and updated as indicated. Interim medical history since our last visit reviewed. Allergies and medications reviewed and updated. DATA REVIEWED: CHART IN EPIC  Family History reviewed for pertinent findings.  Review of Systems  Constitutional: Negative.   HENT: Negative.   Eyes: Negative.   Respiratory: Negative.   Gastrointestinal: Negative.   Genitourinary: Negative.   Musculoskeletal: Positive for myalgias.  Neurological: Negative for tremors and weakness.  Psychiatric/Behavioral: Positive for sleep disturbance.    Allergies as of 02/07/2019      Reactions   Codeine Nausea And Vomiting      Medication List       Accurate as of February 07, 2019 11:59 PM. If you have  any questions, ask your nurse or doctor.        ALPRAZolam 0.25 MG tablet Commonly known as: XANAX Take 1 tablet (0.25 mg total) by mouth at bedtime as needed for anxiety.   atorvastatin 20 MG tablet Commonly known as: LIPITOR Take 1 tablet (20 mg total) by mouth daily.   Belsomra 20 MG Tabs Generic drug: Suvorexant Take 1 tablet by mouth at bedtime.   CALCIUM 1200 PO Take by mouth.   phentermine 37.5 MG capsule Take 1 capsule (37.5 mg total) by mouth daily.   rOPINIRole 0.25 MG tablet Commonly known as: Requip Take 1-2 tablets (0.25-0.5 mg total) by mouth at bedtime. Started by: Terald Sleeper, PA-C   tolterodine 4 MG 24 hr capsule Commonly known as: DETROL LA Take 1 capsule (4 mg total) by mouth daily.   venlafaxine XR 75 MG 24 hr capsule Commonly known as: EFFEXOR-XR TAKE 1 CAPSULE BY MOUTH EVERY DAY   vitamin C 1000 MG tablet Take 1,000 mg by mouth daily.   Vitamin D3 1.25 MG (50000 UT) Caps Take by mouth.          Objective:    BP 126/83   Pulse 72   Temp (!) 97.2 F (36.2 C) (Temporal)   Ht '5\' 2"'$  (1.575 m)   Wt 205 lb 3.2 oz (93.1  kg)   SpO2 98%   BMI 37.53 kg/m   Allergies  Allergen Reactions  . Codeine Nausea And Vomiting    Wt Readings from Last 3 Encounters:  02/07/19 205 lb 3.2 oz (93.1 kg)  02/07/18 195 lb (88.5 kg)  02/06/18 195 lb 9.6 oz (88.7 kg)    Physical Exam Constitutional:      General: She is not in acute distress.    Appearance: Normal appearance. She is well-developed.  HENT:     Head: Normocephalic and atraumatic.  Cardiovascular:     Rate and Rhythm: Normal rate.     Pulses: Normal pulses.     Heart sounds: Normal heart sounds.  Pulmonary:     Effort: Pulmonary effort is normal.     Breath sounds: Normal breath sounds.  Skin:    General: Skin is warm and dry.     Findings: No rash.  Neurological:     Mental Status: She is alert and oriented to person, place, and time.     Cranial Nerves: No cranial nerve  deficit.     Deep Tendon Reflexes: Reflexes are normal and symmetric.     Results for orders placed or performed in visit on 02/07/19  CBC with Differential/Platelet  Result Value Ref Range   WBC 6.3 3.4 - 10.8 x10E3/uL   RBC 4.03 3.77 - 5.28 x10E6/uL   Hemoglobin 12.8 11.1 - 15.9 g/dL   Hematocrit 37.5 34.0 - 46.6 %   MCV 93 79 - 97 fL   MCH 31.8 26.6 - 33.0 pg   MCHC 34.1 31.5 - 35.7 g/dL   RDW 12.7 11.7 - 15.4 %   Platelets 311 150 - 450 x10E3/uL   Neutrophils 61 Not Estab. %   Lymphs 29 Not Estab. %   Monocytes 7 Not Estab. %   Eos 2 Not Estab. %   Basos 1 Not Estab. %   Neutrophils Absolute 3.9 1.4 - 7.0 x10E3/uL   Lymphocytes Absolute 1.8 0.7 - 3.1 x10E3/uL   Monocytes Absolute 0.5 0.1 - 0.9 x10E3/uL   EOS (ABSOLUTE) 0.2 0.0 - 0.4 x10E3/uL   Basophils Absolute 0.1 0.0 - 0.2 x10E3/uL   Immature Granulocytes 0 Not Estab. %   Immature Grans (Abs) 0.0 0.0 - 0.1 x10E3/uL  CMP14+EGFR  Result Value Ref Range   Glucose 102 (H) 65 - 99 mg/dL   BUN 14 8 - 27 mg/dL   Creatinine, Ser 0.89 0.57 - 1.00 mg/dL   GFR calc non Af Amer 67 >59 mL/min/1.73   GFR calc Af Amer 77 >59 mL/min/1.73   BUN/Creatinine Ratio 16 12 - 28   Sodium 139 134 - 144 mmol/L   Potassium 4.4 3.5 - 5.2 mmol/L   Chloride 104 96 - 106 mmol/L   CO2 24 20 - 29 mmol/L   Calcium 9.4 8.7 - 10.3 mg/dL   Total Protein 6.3 6.0 - 8.5 g/dL   Albumin 4.0 3.8 - 4.8 g/dL   Globulin, Total 2.3 1.5 - 4.5 g/dL   Albumin/Globulin Ratio 1.7 1.2 - 2.2   Bilirubin Total 0.4 0.0 - 1.2 mg/dL   Alkaline Phosphatase 134 (H) 39 - 117 IU/L   AST 20 0 - 40 IU/L   ALT 24 0 - 32 IU/L  Lipid Panel  Result Value Ref Range   Cholesterol, Total 148 100 - 199 mg/dL   Triglycerides 128 0 - 149 mg/dL   HDL 55 >39 mg/dL   VLDL Cholesterol Cal 22 5 - 40 mg/dL  LDL Chol Calc (NIH) 71 0 - 99 mg/dL   Chol/HDL Ratio 2.7 0.0 - 4.4 ratio  TSH  Result Value Ref Range   TSH 1.330 0.450 - 4.500 uIU/mL      Assessment & Plan:   1. Mild  episode of recurrent major depressive disorder (HCC) - ALPRAZolam (XANAX) 0.25 MG tablet; Take 1 tablet (0.25 mg total) by mouth at bedtime as needed for anxiety.  Dispense: 30 tablet; Refill: 5  2. Primary insomnia - Suvorexant (BELSOMRA) 20 MG TABS; Take 1 tablet by mouth at bedtime.  Dispense: 90 tablet; Refill: 1  3. Mixed hyperlipidemia - CMP14+EGFR - Lipid Panel  4. Well adult exam - CBC with Differential/Platelet - CMP14+EGFR - Lipid Panel - TSH  5. Restless leg - rOPINIRole (REQUIP) 0.25 MG tablet; Take 1-2 tablets (0.25-0.5 mg total) by mouth at bedtime.  Dispense: 60 tablet; Refill: 5  6. Need for immunization against influenza - Flu Vaccine QUAD High Dose(Fluad)   Continue all other maintenance medications as listed above.  Follow up plan: Recheck 6 months  Educational handout given for [place] PA-C [facility] [address]  [place], [place] [address] [phone]   02/12/2019, 2:52 PM

## 2019-02-24 ENCOUNTER — Other Ambulatory Visit: Payer: Self-pay | Admitting: Physician Assistant

## 2019-02-24 DIAGNOSIS — F5101 Primary insomnia: Secondary | ICD-10-CM

## 2019-02-24 DIAGNOSIS — Z6836 Body mass index (BMI) 36.0-36.9, adult: Secondary | ICD-10-CM

## 2019-03-14 ENCOUNTER — Other Ambulatory Visit: Payer: Self-pay

## 2019-03-14 ENCOUNTER — Ambulatory Visit: Payer: Medicare Other | Attending: Internal Medicine

## 2019-03-14 DIAGNOSIS — Z20822 Contact with and (suspected) exposure to covid-19: Secondary | ICD-10-CM

## 2019-03-15 LAB — NOVEL CORONAVIRUS, NAA: SARS-CoV-2, NAA: NOT DETECTED

## 2019-04-02 ENCOUNTER — Encounter: Payer: Self-pay | Admitting: Physician Assistant

## 2019-04-25 ENCOUNTER — Ambulatory Visit: Payer: Medicare Other

## 2019-05-01 ENCOUNTER — Ambulatory Visit: Payer: Medicare Other

## 2019-05-16 ENCOUNTER — Ambulatory Visit: Payer: Medicare Other

## 2019-05-27 ENCOUNTER — Other Ambulatory Visit: Payer: Self-pay | Admitting: Physician Assistant

## 2019-05-27 DIAGNOSIS — F330 Major depressive disorder, recurrent, mild: Secondary | ICD-10-CM

## 2019-05-27 DIAGNOSIS — E782 Mixed hyperlipidemia: Secondary | ICD-10-CM

## 2019-05-27 DIAGNOSIS — G2581 Restless legs syndrome: Secondary | ICD-10-CM

## 2019-08-04 ENCOUNTER — Other Ambulatory Visit: Payer: Self-pay | Admitting: *Deleted

## 2019-08-04 MED ORDER — TOLTERODINE TARTRATE ER 4 MG PO CP24: 4.0000 mg | ORAL_CAPSULE | Freq: Every day | ORAL | 0 refills | Status: DC

## 2019-08-26 ENCOUNTER — Other Ambulatory Visit: Payer: Self-pay | Admitting: Family

## 2019-08-27 ENCOUNTER — Other Ambulatory Visit: Payer: Self-pay | Admitting: Family

## 2019-08-27 DIAGNOSIS — F5101 Primary insomnia: Secondary | ICD-10-CM

## 2019-08-27 MED ORDER — BELSOMRA 20 MG PO TABS: ORAL_TABLET | ORAL | 1 refills | Status: DC

## 2019-09-24 ENCOUNTER — Encounter: Payer: Self-pay | Admitting: Family

## 2019-09-24 ENCOUNTER — Ambulatory Visit: Payer: Medicare PPO | Admitting: Family

## 2019-09-24 ENCOUNTER — Other Ambulatory Visit: Payer: Self-pay

## 2019-09-24 VITALS — BP 134/70 | HR 75 | Temp 97.0°F | Ht 62.0 in | Wt 213.8 lb

## 2019-09-24 DIAGNOSIS — G2581 Restless legs syndrome: Secondary | ICD-10-CM

## 2019-09-24 DIAGNOSIS — F411 Generalized anxiety disorder: Secondary | ICD-10-CM

## 2019-09-24 DIAGNOSIS — K219 Gastro-esophageal reflux disease without esophagitis: Secondary | ICD-10-CM | POA: Insufficient documentation

## 2019-09-24 DIAGNOSIS — E1169 Type 2 diabetes mellitus with other specified complication: Secondary | ICD-10-CM

## 2019-09-24 DIAGNOSIS — Z6836 Body mass index (BMI) 36.0-36.9, adult: Secondary | ICD-10-CM

## 2019-09-24 DIAGNOSIS — N3281 Overactive bladder: Secondary | ICD-10-CM

## 2019-09-24 DIAGNOSIS — F5101 Primary insomnia: Secondary | ICD-10-CM

## 2019-09-24 DIAGNOSIS — E782 Mixed hyperlipidemia: Secondary | ICD-10-CM

## 2019-09-24 DIAGNOSIS — Z79899 Other long term (current) drug therapy: Secondary | ICD-10-CM | POA: Insufficient documentation

## 2019-09-24 DIAGNOSIS — F330 Major depressive disorder, recurrent, mild: Secondary | ICD-10-CM

## 2019-09-24 DIAGNOSIS — R7303 Prediabetes: Secondary | ICD-10-CM | POA: Insufficient documentation

## 2019-09-24 DIAGNOSIS — Z1159 Encounter for screening for other viral diseases: Secondary | ICD-10-CM

## 2019-09-24 LAB — BAYER DCA HB A1C WAIVED: HB A1C (BAYER DCA - WAIVED): 6 % (ref <7.0)

## 2019-09-24 MED ORDER — OMEPRAZOLE 20 MG PO CPDR: 20.0000 mg | DELAYED_RELEASE_CAPSULE | Freq: Every day | ORAL | 3 refills | Status: DC

## 2019-09-24 MED ORDER — VENLAFAXINE HCL ER 75 MG PO CP24: 75.0000 mg | ORAL_CAPSULE | Freq: Every day | ORAL | 2 refills | Status: DC

## 2019-09-24 MED ORDER — ROPINIROLE HCL 0.25 MG PO TABS: 0.2500 mg | ORAL_TABLET | Freq: Every day | ORAL | 0 refills | Status: DC

## 2019-09-24 MED ORDER — BELSOMRA 20 MG PO TABS: ORAL_TABLET | ORAL | 1 refills | Status: DC

## 2019-09-24 MED ORDER — TOLTERODINE TARTRATE ER 4 MG PO CP24: 4.0000 mg | ORAL_CAPSULE | Freq: Every day | ORAL | 0 refills | Status: DC

## 2019-09-24 MED ORDER — ALPRAZOLAM 0.25 MG PO TABS: 0.2500 mg | ORAL_TABLET | Freq: Every evening | ORAL | 1 refills | Status: DC | PRN

## 2019-09-24 MED ORDER — ATORVASTATIN CALCIUM 20 MG PO TABS: 20.0000 mg | ORAL_TABLET | Freq: Every day | ORAL | 2 refills | Status: DC

## 2019-09-24 NOTE — Patient Instructions (Signed)

## 2019-09-24 NOTE — Progress Notes (Signed)
Subjective:    Patient ID: Sabrina Stein, female    DOB: May 28, 1950, 69 y.o.   MRN: [id number]  Chief Complaint  Patient presents with  . Medical Management of Chronic Issues    [name]    PT presents to the office today to establish care.  Anxiety Presents for follow-up visit. Symptoms include depressed mood, insomnia, irritability and nausea. Symptoms occur occasionally. The severity of symptoms is moderate. The quality of sleep is good.    Hyperlipidemia This is a chronic problem. The current episode started more than 1 year ago. The problem is controlled. Recent lipid tests were reviewed and are normal. Current antihyperlipidemic treatment includes statins. The current treatment provides moderate improvement of lipids. Risk factors for coronary artery disease include dyslipidemia and hypertension.  Insomnia Primary symptoms: difficulty falling asleep, frequent awakening.  The current episode started more than one year. The onset quality is gradual. The problem occurs intermittently. PMH includes: depression.  Depression        Associated symptoms include insomnia. Urinary Frequency  This is a chronic problem. The current episode started more than 1 year ago. The problem occurs intermittently. The problem has been waxing and waning. Associated symptoms include frequency and nausea.  Diabetes She presents for her follow-up diabetic visit. She has type 2 diabetes mellitus. There are no hypoglycemic associated symptoms. Pertinent negatives for diabetes include no blurred vision and no foot paresthesias. Symptoms are stable. Risk factors for coronary artery disease include diabetes mellitus, dyslipidemia, female sex and hypertension. She is following a generally healthy diet. An ACE inhibitor/angiotensin II receptor blocker is being taken.  Gastroesophageal Reflux She complains of belching, dysphagia, heartburn and nausea. This is a chronic problem. The current episode started more than  1 year ago. The problem occurs occasionally. The problem has been waxing and waning. She has tried a histamine-2 antagonist for the symptoms. The treatment provided mild relief.      Review of Systems  Constitutional: Positive for irritability.  Eyes: Negative for blurred vision.  Gastrointestinal: Positive for dysphagia, heartburn and nausea.  Genitourinary: Positive for frequency.  Psychiatric/Behavioral: Positive for depression. The patient has insomnia.   All other systems reviewed and are negative.      Objective:   Physical Exam Vitals reviewed.  Constitutional:      General: She is not in acute distress.    Appearance: She is well-developed.  HENT:     Head: Normocephalic and atraumatic.     Right Ear: Tympanic membrane normal.     Left Ear: Tympanic membrane normal.  Eyes:     Pupils: Pupils are equal, round, and reactive to light.  Neck:     Thyroid: No thyromegaly.  Cardiovascular:     Rate and Rhythm: Normal rate and regular rhythm.     Heart sounds: Normal heart sounds. No murmur heard.   Pulmonary:     Effort: Pulmonary effort is normal. No respiratory distress.     Breath sounds: Normal breath sounds. No wheezing.  Abdominal:     General: Bowel sounds are normal. There is no distension.     Palpations: Abdomen is soft.     Tenderness: There is no abdominal tenderness.  Musculoskeletal:        General: No tenderness. Normal range of motion.     Cervical back: Normal range of motion and neck supple.  Skin:    General: Skin is warm and dry.  Neurological:     Mental Status: She is alert and  oriented to person, place, and time.     Cranial Nerves: No cranial nerve deficit.     Deep Tendon Reflexes: Reflexes are normal and symmetric.  Psychiatric:        Behavior: Behavior normal.        Thought Content: Thought content normal.        Judgment: Judgment normal.     BP 134/70   Pulse 75   Temp (!) 97 F (36.1 C) (Temporal)   Ht '5\' 2"'  (1.575 m)   Wt  213 lb 12.8 oz (97 kg)   BMI 39.10 kg/m       Assessment & Plan:  Indyah Saulnier comes in today with chief complaint of Medical Management of Chronic Issues [name] patient )   Diagnosis and orders addressed:  1. Body mass index 36.0-36.9, adult - CMP14+EGFR - CBC with Differential/Platelet  2. Mild episode of recurrent major depressive disorder (HCC) - venlafaxine XR (EFFEXOR-XR) 75 MG 24 hr capsule; Take 1 capsule (75 mg total) by mouth daily.  Dispense: 90 capsule; Refill: 2 - CMP14+EGFR - CBC with Differential/Platelet  3. Mixed hyperlipidemia - atorvastatin (LIPITOR) 20 MG tablet; Take 1 tablet (20 mg total) by mouth daily.  Dispense: 90 tablet; Refill: 2 - CMP14+EGFR - CBC with Differential/Platelet - Lipid panel  4. Primary insomnia - Suvorexant (BELSOMRA) 20 MG TABS; TAKE 1 TABLET BY MOUTH EVERYDAY AT BEDTIME  Dispense: 90 tablet; Refill: 1 - CMP14+EGFR - CBC with Differential/Platelet  5. Restless leg - rOPINIRole (REQUIP) 0.25 MG tablet; Take 1-2 tablets (0.25-0.5 mg total) by mouth at bedtime.  Dispense: 180 tablet; Refill: 0 - CMP14+EGFR - CBC with Differential/Platelet  6. Overactive bladder - tolterodine (DETROL LA) 4 MG 24 hr capsule; Take 1 capsule (4 mg total) by mouth daily. (Needs to be seen before next refill)  Dispense: 30 capsule; Refill: 0 - CMP14+EGFR - CBC with Differential/Platelet  7. Morbid obesity ([place]) - CMP14+EGFR - CBC with Differential/Platelet  8. Gastroesophageal reflux disease without esophagitis - CMP14+EGFR - CBC with Differential/Platelet - omeprazole (PRILOSEC) 20 MG capsule; Take 1 capsule (20 mg total) by mouth daily.  Dispense: 30 capsule; Refill: 3  9. Controlled substance agreement signed - ALPRAZolam (XANAX) 0.25 MG tablet; Take 1 tablet (0.25 mg total) by mouth at bedtime as needed for anxiety.  Dispense: 20 tablet; Refill: 1 - Suvorexant (BELSOMRA) 20 MG TABS; TAKE 1 TABLET BY MOUTH EVERYDAY AT BEDTIME  Dispense: 90  tablet; Refill: 1 - CMP14+EGFR - CBC with Differential/Platelet - ToxASSURE Select 13 (MW), Urine  10. Type 2 diabetes mellitus with other specified complication, without long-term current use of insulin (HCC) - Bayer DCA Hb A1c Waived - CMP14+EGFR - CBC with Differential/Platelet - Microalbumin / creatinine urine ratio  11. GAD (generalized anxiety disorder) - venlafaxine XR (EFFEXOR-XR) 75 MG 24 hr capsule; Take 1 capsule (75 mg total) by mouth daily.  Dispense: 90 capsule; Refill: 2 - ALPRAZolam (XANAX) 0.25 MG tablet; Take 1 tablet (0.25 mg total) by mouth at bedtime as needed for anxiety.  Dispense: 20 tablet; Refill: 1  12. Need for hepatitis C screening test - Hepatitis C antibody   Labs pending Patient reviewed in [place] controlled database, no flags noted. Contract and drug screen are up dated today.  Health Maintenance reviewed Diet and exercise encouraged  Follow up plan: 6 months    Evelina Dun, FNP

## 2019-09-25 ENCOUNTER — Other Ambulatory Visit: Payer: Self-pay | Admitting: Family

## 2019-09-25 DIAGNOSIS — N3281 Overactive bladder: Secondary | ICD-10-CM

## 2019-09-25 LAB — CBC WITH DIFFERENTIAL/PLATELET
Basophils Absolute: 0.1 10*3/uL (ref 0.0–0.2)
Basos: 1 %
EOS (ABSOLUTE): 0.1 10*3/uL (ref 0.0–0.4)
Eos: 2 %
Hematocrit: 38.2 % (ref 34.0–46.6)
Hemoglobin: 12.8 g/dL (ref 11.1–15.9)
Immature Grans (Abs): 0 10*3/uL (ref 0.0–0.1)
Immature Granulocytes: 0 %
Lymphocytes Absolute: 1.9 10*3/uL (ref 0.7–3.1)
Lymphs: 26 %
MCH: 31.4 pg (ref 26.6–33.0)
MCHC: 33.5 g/dL (ref 31.5–35.7)
MCV: 94 fL (ref 79–97)
Monocytes Absolute: 0.6 10*3/uL (ref 0.1–0.9)
Monocytes: 8 %
Neutrophils Absolute: 4.5 10*3/uL (ref 1.4–7.0)
Neutrophils: 63 %
Platelets: 335 10*3/uL (ref 150–450)
RBC: 4.07 x10E6/uL (ref 3.77–5.28)
RDW: 12.9 % (ref 11.7–15.4)
WBC: 7.2 10*3/uL (ref 3.4–10.8)

## 2019-09-25 LAB — CMP14+EGFR
ALT: 27 IU/L (ref 0–32)
AST: 21 IU/L (ref 0–40)
Albumin/Globulin Ratio: 1.6 (ref 1.2–2.2)
Albumin: 4.2 g/dL (ref 3.8–4.8)
Alkaline Phosphatase: 142 IU/L — ABNORMAL HIGH (ref 48–121)
BUN/Creatinine Ratio: 13 (ref 12–28)
BUN: 13 mg/dL (ref 8–27)
Bilirubin Total: 0.4 mg/dL (ref 0.0–1.2)
CO2: 24 mmol/L (ref 20–29)
Calcium: 10 mg/dL (ref 8.7–10.3)
Chloride: 101 mmol/L (ref 96–106)
Creatinine, Ser: 0.97 mg/dL (ref 0.57–1.00)
GFR calc Af Amer: 69 mL/min/{1.73_m2} (ref >59)
GFR calc non Af Amer: 60 mL/min/{1.73_m2} (ref >59)
Globulin, Total: 2.6 g/dL (ref 1.5–4.5)
Glucose: 118 mg/dL — ABNORMAL HIGH (ref 65–99)
Potassium: 4.5 mmol/L (ref 3.5–5.2)
Sodium: 139 mmol/L (ref 134–144)
Total Protein: 6.8 g/dL (ref 6.0–8.5)

## 2019-09-25 LAB — LIPID PANEL
Chol/HDL Ratio: 2.8 ratio (ref 0.0–4.4)
Cholesterol, Total: 166 mg/dL (ref 100–199)
HDL: 59 mg/dL (ref >39)
LDL Chol Calc (NIH): 85 mg/dL (ref 0–99)
Triglycerides: 122 mg/dL (ref 0–149)
VLDL Cholesterol Cal: 22 mg/dL (ref 5–40)

## 2019-09-25 LAB — MICROALBUMIN / CREATININE URINE RATIO
Creatinine, Urine: 146 mg/dL
Microalb/Creat Ratio: 12 mg/g creat (ref 0–29)
Microalbumin, Urine: 17.7 ug/mL

## 2019-09-25 LAB — HEPATITIS C ANTIBODY: Hep C Virus Ab: <0.1 s/co ratio (ref 0.0–0.9)

## 2019-09-25 MED ORDER — BLOOD GLUCOSE METER KIT: PACK | 0 refills | Status: DC

## 2019-09-25 MED ORDER — TOLTERODINE TARTRATE ER 4 MG PO CP24: 4.0000 mg | ORAL_CAPSULE | Freq: Every day | ORAL | 0 refills | Status: DC

## 2019-09-25 NOTE — Telephone Encounter (Signed)
Do not see where and abx was rx please advise

## 2019-10-01 ENCOUNTER — Other Ambulatory Visit: Payer: Self-pay

## 2019-10-01 ENCOUNTER — Ambulatory Visit (INDEPENDENT_AMBULATORY_CARE_PROVIDER_SITE_OTHER): Payer: Medicare PPO | Admitting: Pharmacist

## 2019-10-01 DIAGNOSIS — R7303 Prediabetes: Secondary | ICD-10-CM

## 2019-10-01 LAB — TOXASSURE SELECT 13 (MW), URINE

## 2019-10-01 NOTE — Progress Notes (Signed)
° ° °  10/01/2019 Name: Sabrina Stein MRN: [id number] DOB: 09/03/1950   S:  38 yoF presents for diabetes evaluation, education, and management Patient was referred and last seen by Primary Care Provider on 09/24/19.  Insurance coverage/medication affordability: [employer] MEDICARE  Patient reports adherence with medications.  Current diabetes medications include: N/A  Current hypertension medications include: N/A Goal 130/80  Current hyperlipidemia medications include: ATORVASTATIN   Patient denies hypoglycemic events.   Patient reported dietary habits: Eats 2-3 meals/day Discussed meal planning options and Plate method for healthy eating  Avoid sugary drinks and desserts  Incorporate balanced protein, non starchy veggies, 1 serving of carbohydrate with each meal  Increase water intake  Increase physical activity as able  Patient-reported exercise habits: walking dogs, encouraged  O:  Lab Results  Component Value Date   HGBA1C 6.0 09/24/2019   Lipid Panel     Component Value Date/Time   CHOL 166 09/24/2019 0857   TRIG 122 09/24/2019 0857   HDL 59 09/24/2019 0857   CHOLHDL 2.8 09/24/2019 0857   LDLCALC 85 09/24/2019 0857    Home fasting blood sugars: not checking  2 hour post-meal/random blood sugars: n/a.   A/P:  Pre-Diabetes newly diagnosed.  A1c 6%  -Set up Accucheck guide glucometer for patient (lancet device not working properly, encouraged patient to take it back to pharmacy),  Gave push button lancets for patient to test in the interim.  Encouraged patient to check once weekly.  -Extensively discussed pathophysiology of diabetes, recommended lifestyle interventions, dietary effects on blood sugar control  -Counseled on s/sx of and management of hypoglycemia  -Next A1C anticipated 6-12 months.    Written patient instructions provided.  Total time in face to face counseling 30 minutes.     Kieth Brightly, PharmD, BCPS Clinical Pharmacist, [facility]  II Phone [phone]

## 2019-10-03 ENCOUNTER — Encounter: Payer: Self-pay | Admitting: Pharmacist

## 2019-10-08 ENCOUNTER — Telehealth: Payer: Self-pay | Admitting: Family Medicine

## 2019-10-09 NOTE — Telephone Encounter (Signed)
Changed.

## 2019-10-10 NOTE — Telephone Encounter (Signed)
Pt aware.

## 2019-10-13 ENCOUNTER — Other Ambulatory Visit: Payer: Self-pay | Admitting: Family

## 2019-10-16 ENCOUNTER — Other Ambulatory Visit: Payer: Self-pay | Admitting: Family

## 2019-10-16 DIAGNOSIS — N3281 Overactive bladder: Secondary | ICD-10-CM

## 2019-11-27 ENCOUNTER — Other Ambulatory Visit: Payer: Self-pay | Admitting: Family

## 2019-11-27 DIAGNOSIS — K219 Gastro-esophageal reflux disease without esophagitis: Secondary | ICD-10-CM

## 2019-11-27 DIAGNOSIS — N3281 Overactive bladder: Secondary | ICD-10-CM

## 2020-01-13 ENCOUNTER — Other Ambulatory Visit: Payer: Self-pay

## 2020-01-13 ENCOUNTER — Ambulatory Visit (INDEPENDENT_AMBULATORY_CARE_PROVIDER_SITE_OTHER): Payer: Medicare PPO | Admitting: Family Medicine

## 2020-01-13 DIAGNOSIS — Z23 Encounter for immunization: Secondary | ICD-10-CM | POA: Diagnosis not present

## 2020-02-02 ENCOUNTER — Other Ambulatory Visit: Payer: Self-pay | Admitting: Family

## 2020-02-02 DIAGNOSIS — G2581 Restless legs syndrome: Secondary | ICD-10-CM

## 2020-02-12 ENCOUNTER — Ambulatory Visit (INDEPENDENT_AMBULATORY_CARE_PROVIDER_SITE_OTHER): Payer: Medicare PPO | Admitting: *Deleted

## 2020-02-12 DIAGNOSIS — Z0000 Encounter for general adult medical examination without abnormal findings: Secondary | ICD-10-CM | POA: Diagnosis not present

## 2020-02-12 NOTE — Progress Notes (Addendum)
MEDICARE ANNUAL WELLNESS VISIT  02/12/2020  Telephone Visit Disclaimer This Medicare AWV was conducted by telephone due to national recommendations for restrictions regarding the COVID-19 Pandemic (e.g. social distancing).  I verified, using two identifiers, that I am speaking with Sabrina Stein or their authorized healthcare agent. I discussed the limitations, risks, security, and privacy concerns of performing an evaluation and management service by telephone and the potential availability of an in-person appointment in the future. The patient expressed understanding and agreed to proceed.  Location of Patient: home Location of Provider (nurse): office  Subjective:    Sabrina Stein is a 69 y.o. female patient of Hawks, Theador Hawthorne, FNP who had a Medicare Annual Wellness Visit today via telephone. Sabrina Stein is Retired and lives with their spouse. she has 2 children. she reports that she is socially active and does interact with friends/family regularly. she is minimally physically active and enjoys sewing.  Patient Care Team: Sharion Balloon, FNP as PCP - General (Family Medicine) Lavera Guise, [facility] (Pharmacist)  Advanced Directives 02/12/2020 02/11/2019 09/18/2018 02/07/2018 01/25/2017  Does Patient Have a Medical Advance Directive? _0   Type of Advance Directive Out of facility DNR (pink MOST or yellow form);Living will [place];Living will [place];Living will [place];Living will Living will;Healthcare Power of Attorney  Does patient want to make changes to medical advance directive? No - Patient declined No - Patient declined - No - Patient declined No - Patient declined  Copy of [place] in Chart? - No - copy requested No - copy requested No - copy requested No - copy requested    Hospital Utilization Over the Past 12 Months: # of hospitalizations or ER visits: 0 # of surgeries:  0  Review of Systems    Patient reports that her overall health is unchanged compared to last year.  History obtained from chart review and the patient  Patient Reported Readings (BP, Pulse, CBG, Weight, etc) none  Pain Assessment Pain : No/denies pain     Current Medications & Allergies (verified) Allergies as of 02/12/2020       Reactions   Codeine Nausea And Vomiting        Medication List        Accurate as of February 12, 2020  8:50 AM. If you have any questions, ask your nurse or doctor.          STOP taking these medications    Accu-Chek Guide test strip Generic drug: glucose blood   blood glucose meter kit and supplies   famotidine 10 MG tablet Commonly known as: PEPCID       TAKE these medications    ALPRAZolam 0.25 MG tablet Commonly known as: XANAX Take 1 tablet (0.25 mg total) by mouth at bedtime as needed for anxiety.   atorvastatin 20 MG tablet Commonly known as: LIPITOR Take 1 tablet (20 mg total) by mouth daily.   Belsomra 20 MG Tabs Generic drug: Suvorexant TAKE 1 TABLET BY MOUTH EVERYDAY AT BEDTIME   CALCIUM 1200 PO Take by mouth.   omeprazole 20 MG capsule Commonly known as: PRILOSEC TAKE 1 CAPSULE BY MOUTH EVERY DAY   rOPINIRole 0.25 MG tablet Commonly known as: REQUIP TAKE 1-2 TABLETS BY MOUTH AT BEDTIME.   tolterodine 4 MG 24 hr capsule Commonly known as: DETROL LA TAKE 1 CAPSULE BY MOUTH EVERY DAY   venlafaxine XR 75 MG 24 hr capsule Commonly  known as: EFFEXOR-XR Take 1 capsule (75 mg total) by mouth daily.   vitamin C 1000 MG tablet Take 1,000 mg by mouth daily.   Vitamin D3 1.25 MG (50000 UT) Caps Take by mouth.   zinc gluconate 50 MG tablet Take 50 mg by mouth daily.        History (reviewed): Past Medical History:  Diagnosis Date   Depression    Hyperlipidemia    Past Surgical History:  Procedure Laterality Date   APPENDECTOMY     CESAREAN SECTION     x2   Family History  Problem  Relation Age of Onset   Cancer Father        lung   Healthy Sister    Healthy Daughter    Healthy Son    Social History   Socioeconomic History   Marital status: Married    Spouse name: Herbie [place]   Number of children: 2   Years of education: 18   Highest education level: Master's degree (e.g., MA, MS, MEng, MEd, MSW, MBA)  Occupational History   Occupation: Retired   Tobacco Use   Smoking status: Former Smoker    Packs/day: 1.00    Years: 43.00    Pack years: 43.00    Types: Cigarettes    Quit date: 01/26/2012    Years since quitting: 8.0   Smokeless tobacco: Never Used  Vaping Use   Vaping Use: Never used  Substance and Sexual Activity   Alcohol use: Yes    Comment: occassional-glass of wine once a month   Drug use: No   Sexual activity: Yes  Other Topics Concern   Not on file  Social History Narrative   Not on file   Social Determinants of Health   Financial Resource Strain: Low Risk    Difficulty of Paying Living Expenses: Not hard at all  Food Insecurity: No Food Insecurity   Worried About Charity fundraiser in the Last Year: Never true   [place] in the Last Year: Never true  Transportation Needs: No Transportation Needs   Lack of Transportation (Medical): No   Lack of Transportation (Non-Medical): No  Physical Activity: Insufficiently Active   Days of Exercise per Week: 3 days   Minutes of Exercise per Session: 30 min  Stress: No Stress Concern Present   Feeling of Stress : Only a little  Social Connections: Engineer, building services of Communication with Friends and Family: More than three times a week   Frequency of Social Gatherings with Friends and Family: More than three times a week   Attends Religious Services: More than 4 times per year   Active Member of [employer] or Organizations: Yes   Attends Archivist Meetings: 1 to 4 times per year   Marital Status: Married    Activities of Daily Living In your present state of  health, do you have any difficulty performing the following activities: 02/12/2020  Hearing? N  Vision? Y  Comment dry eyes, wears glasses  Difficulty concentrating or making decisions? N  Walking or climbing stairs? N  Dressing or bathing? N  Doing errands, shopping? N  Preparing Food and eating ? Y  Using the Toilet? N  In the past six months, have you accidently leaked urine? N  Do you have problems with loss of bowel control? N  Managing your Medications? N  Managing your Finances? N  Housekeeping or managing your Housekeeping? N  Some recent data might be hidden  Patient Education/ Literacy How often do you need to have someone help you when you read instructions, pamphlets, or other written materials from your doctor or pharmacy?: 1 - Never What is the last grade level you completed in school?: masters  Exercise Current Exercise Habits: Home exercise routine, Type of exercise: walking, Time (Minutes): 30, Frequency (Times/Week): 3, Weekly Exercise (Minutes/Week): 90, Intensity: Mild, Exercise limited by: None identified  Diet Patient reports consuming 2 meals a day and 1 snack(s) a day Patient reports that her primary diet is: Regular Patient reports that she does have regular access to food.   Depression Screen PHQ 2/9 Scores 02/12/2020 09/24/2019 02/11/2019 02/07/2019 02/07/2018 02/06/2018 07/24/2017  PHQ - 2 Score 0 0 0 0 0 0 0  PHQ- 9 Score - 4 - 5 - - -     Fall Risk Fall Risk  02/12/2020 09/24/2019 02/11/2019 02/07/2019 02/07/2018  Falls in the past year? 0 0 1 1 0  Number falls in past yr: - - 0 0 -  Injury with Fall? - - 1 1 -  Follow up - - Falls prevention discussed Falls prevention discussed -  Comment - - Get rid of all throw rugs in the house, adequate lighting in the walkways and grab bars in the bathroom - -     Objective:  Sabrina Stein seemed alert and oriented and she participated appropriately during our telephone visit.  Blood Pressure Weight  BMI  BP Readings from Last 3 Encounters:  09/24/19 134/70  02/07/19 126/83  02/07/18 131/85   Wt Readings from Last 3 Encounters:  09/24/19 213 lb 12.8 oz (97 kg)  02/07/19 205 lb 3.2 oz (93.1 kg)  02/07/18 195 lb (88.5 kg)   BMI Readings from Last 1 Encounters:  09/24/19 39.10 kg/m    *Unable to obtain current vital signs, weight, and BMI due to telephone visit type  Hearing/Vision  Izora Gala did not seem to have difficulty with hearing/understanding during the telephone conversation Reports that she has had a formal eye exam by an eye care professional within the past year Reports that she has not had a formal hearing evaluation within the past year *Unable to fully assess hearing and vision during telephone visit type  Cognitive Function: 6CIT Screen 02/12/2020 02/11/2019  What Year? 0 points 0 points  What month? 0 points 0 points  What time? 0 points 0 points  Count back from 20 0 points 0 points  Months in reverse 0 points 0 points  Repeat phrase 0 points 0 points  Total Score 0 0   (Normal:0-7, Significant for Dysfunction: >8)  Normal Cognitive Function Screening: Yes   Immunization & Health Maintenance Record Immunization History  Administered Date(s) Administered   Fluad Quad(high Dose 65+) 02/07/2019, 01/13/2020   Influenza, High Dose Seasonal PF 12/20/2015, 02/06/2018   Influenza,inj,Quad PF,6+ Mos 01/16/2017   Moderna SARS-COVID-2 Vaccination 05/01/2019, 05/30/2019   Pneumococcal Conjugate-13 12/06/2015   Pneumococcal Polysaccharide-23 02/07/2018   Td 11/14/2011   Zoster Recombinat (Shingrix) 01/25/2017, 02/07/2018    Health Maintenance  Topic Date Due   OPHTHALMOLOGY EXAM  Never done   COLONOSCOPY  Never done   MAMMOGRAM  09/23/2020 (Originally 10/12/2000)   HEMOGLOBIN A1C  03/25/2020   FOOT EXAM  09/23/2020   URINE MICROALBUMIN  09/23/2020   TETANUS/TDAP  11/13/2021   INFLUENZA VACCINE  Completed   DEXA SCAN  Completed   COVID-19 Vaccine  Completed    Hepatitis C Screening  Completed   PNA vac Low Risk  Adult  Completed       Assessment  This is a routine wellness examination for Sabrina Stein.  Health Maintenance: Due or Overdue Health Maintenance Due  Topic Date Due   OPHTHALMOLOGY EXAM  Never done   COLONOSCOPY  Never done    Sabrina Stein does not need a referral for Community Assistance: Care Management:   no Social Work:    no Prescription Assistance:  no Nutrition/Diabetes Education:  no   Plan:  Personalized Goals Goals Addressed             This Visit's Progress    Exercise 150 minutes per week (moderate activity)   Not on track    Sidney Maintenance & Screening Recommendations  Colorectal cancer screening  Lung Cancer Screening Recommended: yes (Low Dose CT Chest recommended if Age 60-80 years, 30 pack-year currently smoking OR have quit w/in past 15 years) Hepatitis C Screening recommended: no HIV Screening recommended: no  Advanced Directives: Written information was not prepared per patient's request.  Referrals & Orders No orders of the defined types were placed in this encounter.   Follow-up Plan Follow-up with Sharion Balloon, FNP as planned. Would recommend Chest CT, pt smoked for over 40 years, quit in 2013. Pt needs colonoscopy or cologuard. Vision good, had eye exam in Feb. 2021. Hearing good.  Pt has a living will and a DNR, copies requested for our records. Pt had questions about plantar fascitis, mailed exercises to pt. AVS mailed also   I have personally reviewed and noted the following in the patient's chart:   Medical and social history Use of alcohol, tobacco or illicit drugs  Current medications and supplements Functional ability and status Nutritional status Physical activity Advanced directives List of other physicians Hospitalizations, surgeries, and ER visits in previous 12  months Vitals Screenings to include cognitive, depression, and falls Referrals and appointments  In addition, I have reviewed and discussed with Sabrina Stein certain preventive protocols, quality metrics, and best practice recommendations. A written personalized care plan for preventive services as well as general preventive health recommendations is available and can be mailed to the patient at her request.      Rana Snare, LPN  [id number]  I have reviewed and agree with the above AWV documentation.   Evelina Dun, FNP

## 2020-02-12 NOTE — Patient Instructions (Signed)
Plantar Fasciitis  Plantar fasciitis is a painful foot condition that affects the heel. It occurs when the band of tissue that connects the toes to the heel bone (plantar fascia) becomes irritated. This can happen as the result of exercising too much or doing other repetitive activities (overuse injury). The pain from plantar fasciitis can range from mild irritation to severe pain that makes it difficult to walk or move. The pain is usually worse in the morning after sleeping, or after sitting or lying down for a while. Pain may also be worse after long periods of walking or standing. What are the causes? This condition may be caused by:  Standing for long periods of time.  Wearing shoes that do not have good arch support.  Doing activities that put stress on joints (high-impact activities), including running, aerobics, and ballet.  Being overweight.  An abnormal way of walking (gait).  Tight muscles in the back of your lower leg (calf).  High arches in your feet.  Starting a new athletic activity. What are the signs or symptoms? The main symptom of this condition is heel pain. Pain may:  Be worse with first steps after a time of rest, especially in the morning after sleeping or after you have been sitting or lying down for a while.  Be worse after long periods of standing still.  Decrease after 30-45 minutes of activity, such as gentle walking. How is this diagnosed? This condition may be diagnosed based on your medical history and your symptoms. Your health care provider may ask questions about your activity level. Your health care provider will do a physical exam to check for:  A tender area on the bottom of your foot.  A high arch in your foot.  Pain when you move your foot.  Difficulty moving your foot. You may have imaging tests to confirm the diagnosis, such as:  X-rays.  Ultrasound.  MRI. How is this treated? Treatment for plantar fasciitis depends on how  severe your condition is. Treatment may include:  Rest, ice, applying pressure (compression), and raising the affected foot (elevation). This may be called RICE therapy. Your health care provider may recommend RICE therapy along with over-the-counter pain medicines to manage your pain.  Exercises to stretch your calves and your plantar fascia.  A splint that holds your foot in a stretched, upward position while you sleep (night splint).  Physical therapy to relieve symptoms and prevent problems in the future.  Injections of steroid medicine (cortisone) to relieve pain and inflammation.  Stimulating your plantar fascia with electrical impulses (extracorporeal shock wave therapy). This is usually the last treatment option before surgery.  Surgery, if other treatments have not worked after 12 months. Follow these instructions at home:  Managing pain, stiffness, and swelling  If directed, put ice on the painful area: ? Put ice in a plastic bag, or use a frozen bottle of water. ? Place a towel between your skin and the bag or bottle. ? Roll the bottom of your foot over the bag or bottle. ? Do this for 20 minutes, 2-3 times a day.  Wear athletic shoes that have air-sole or gel-sole cushions, or try wearing soft shoe inserts that are designed for plantar fasciitis.  Raise (elevate) your foot above the level of your heart while you are sitting or lying down. Activity  Avoid activities that cause pain. Ask your health care provider what activities are safe for you.  Do physical therapy exercises and stretches as told   by your health care provider.  Try activities and forms of exercise that are easier on your joints (low-impact). Examples include swimming, water aerobics, and biking. General instructions  Take over-the-counter and prescription medicines only as told by your health care provider.  Wear a night splint while sleeping, if told by your health care provider. Loosen the splint  if your toes tingle, become numb, or turn cold and blue.  Maintain a healthy weight, or work with your health care provider to lose weight as needed.  Keep all follow-up visits as told by your health care provider. This is important. Contact a health care provider if you:  Have symptoms that do not go away after caring for yourself at home.  Have pain that gets worse.  Have pain that affects your ability to move or do your daily activities. Summary  Plantar fasciitis is a painful foot condition that affects the heel. It occurs when the band of tissue that connects the toes to the heel bone (plantar fascia) becomes irritated.  The main symptom of this condition is heel pain that may be worse after exercising too much or standing still for a long time.  Treatment varies, but it usually starts with rest, ice, compression, and elevation (RICE therapy) and over-the-counter medicines to manage pain. This information is not intended to replace advice given to you by your health care provider. Make sure you discuss any questions you have with your health care provider. Document Revised: 02/23/2017 Document Reviewed: 01/08/2017 Elsevier Patient Education  2020 [employer]  

## 2020-02-24 ENCOUNTER — Other Ambulatory Visit: Payer: Self-pay | Admitting: Family

## 2020-02-24 DIAGNOSIS — N3281 Overactive bladder: Secondary | ICD-10-CM

## 2020-03-02 ENCOUNTER — Other Ambulatory Visit: Payer: Self-pay | Admitting: Family

## 2020-03-02 DIAGNOSIS — K219 Gastro-esophageal reflux disease without esophagitis: Secondary | ICD-10-CM

## 2020-03-25 ENCOUNTER — Other Ambulatory Visit: Payer: Self-pay | Admitting: Family

## 2020-03-25 DIAGNOSIS — K219 Gastro-esophageal reflux disease without esophagitis: Secondary | ICD-10-CM

## 2020-03-25 NOTE — Telephone Encounter (Signed)
[facility]. NTBS 30 days given 03/02/20

## 2020-04-03 ENCOUNTER — Other Ambulatory Visit: Payer: Self-pay | Admitting: Family

## 2020-04-03 DIAGNOSIS — N3281 Overactive bladder: Secondary | ICD-10-CM

## 2020-04-08 ENCOUNTER — Encounter: Payer: Self-pay | Admitting: Family

## 2020-04-08 ENCOUNTER — Other Ambulatory Visit: Payer: Self-pay

## 2020-04-08 ENCOUNTER — Ambulatory Visit: Payer: Medicare PPO | Admitting: Family

## 2020-04-08 VITALS — BP 148/83 | HR 78 | Temp 96.8°F | Ht 62.0 in | Wt 216.8 lb

## 2020-04-08 DIAGNOSIS — N3281 Overactive bladder: Secondary | ICD-10-CM | POA: Diagnosis not present

## 2020-04-08 DIAGNOSIS — F5101 Primary insomnia: Secondary | ICD-10-CM

## 2020-04-08 DIAGNOSIS — F330 Major depressive disorder, recurrent, mild: Secondary | ICD-10-CM

## 2020-04-08 DIAGNOSIS — G2581 Restless legs syndrome: Secondary | ICD-10-CM | POA: Diagnosis not present

## 2020-04-08 DIAGNOSIS — K219 Gastro-esophageal reflux disease without esophagitis: Secondary | ICD-10-CM

## 2020-04-08 DIAGNOSIS — M159 Polyosteoarthritis, unspecified: Secondary | ICD-10-CM | POA: Insufficient documentation

## 2020-04-08 DIAGNOSIS — R7303 Prediabetes: Secondary | ICD-10-CM

## 2020-04-08 DIAGNOSIS — F411 Generalized anxiety disorder: Secondary | ICD-10-CM

## 2020-04-08 DIAGNOSIS — E782 Mixed hyperlipidemia: Secondary | ICD-10-CM | POA: Diagnosis not present

## 2020-04-08 DIAGNOSIS — Z79899 Other long term (current) drug therapy: Secondary | ICD-10-CM | POA: Diagnosis not present

## 2020-04-08 LAB — BAYER DCA HB A1C WAIVED: HB A1C (BAYER DCA - WAIVED): 6.4 % (ref <7.0)

## 2020-04-08 MED ORDER — VENLAFAXINE HCL ER 75 MG PO CP24: 75.0000 mg | ORAL_CAPSULE | Freq: Every day | ORAL | 2 refills | Status: DC

## 2020-04-08 MED ORDER — OMEPRAZOLE 20 MG PO CPDR: 20.0000 mg | DELAYED_RELEASE_CAPSULE | Freq: Every day | ORAL | 0 refills | Status: DC

## 2020-04-08 MED ORDER — DICLOFENAC SODIUM 75 MG PO TBEC: 75.0000 mg | DELAYED_RELEASE_TABLET | Freq: Two times a day (BID) | ORAL | 2 refills | Status: DC

## 2020-04-08 MED ORDER — BELSOMRA 20 MG PO TABS: ORAL_TABLET | ORAL | 1 refills | Status: DC

## 2020-04-08 MED ORDER — ALPRAZOLAM 0.25 MG PO TABS: 0.2500 mg | ORAL_TABLET | Freq: Every evening | ORAL | 1 refills | Status: DC | PRN

## 2020-04-08 MED ORDER — ROPINIROLE HCL 0.25 MG PO TABS: 0.2500 mg | ORAL_TABLET | Freq: Every day | ORAL | 1 refills | Status: DC

## 2020-04-08 MED ORDER — ATORVASTATIN CALCIUM 20 MG PO TABS: 20.0000 mg | ORAL_TABLET | Freq: Every day | ORAL | 2 refills | Status: DC

## 2020-04-08 MED ORDER — TOLTERODINE TARTRATE ER 4 MG PO CP24: 4.0000 mg | ORAL_CAPSULE | Freq: Every day | ORAL | 0 refills | Status: DC

## 2020-04-08 NOTE — Patient Instructions (Signed)
[email or web address].[email or web address].Gov">  Generalized Anxiety Disorder, Adult Generalized anxiety disorder (GAD) is a mental health condition. Unlike normal worries, anxiety related to GAD is not triggered by a specific event. These worries do not fade or get better with time. GAD interferes with relationships, work, and school. GAD symptoms can vary from mild to severe. People with severe GAD can have intense waves of anxiety with physical symptoms that are similar to panic attacks. What are the causes? The exact cause of GAD is not known, but the following are believed to have an impact:  Differences in natural brain chemicals.  Genes passed down from parents to children.  Differences in the way threats are perceived.  Development during childhood.  Personality. What increases the risk? The following factors may make you more likely to develop this condition:  Being female.  Having a family history of anxiety disorders.  Being very shy.  Experiencing very stressful life events, such as the death of a loved one.  Having a very stressful family environment. What are the signs or symptoms? People with GAD often worry excessively about many things in their lives, such as their health and family. Symptoms may also include:  Mental and emotional symptoms: ? Worrying excessively about natural disasters. ? Fear of being late. ? Difficulty concentrating. ? Fears that others are judging your performance.  Physical symptoms: ? Fatigue. ? Headaches, muscle tension, muscle twitches, trembling, or feeling shaky. ? Feeling like your heart is pounding or beating very fast. ? Feeling out of breath or like you cannot take a deep breath. ? Having trouble falling asleep or staying asleep, or experiencing restlessness. ? Sweating. ? Nausea, diarrhea, or irritable bowel syndrome (IBS).  Behavioral symptoms: ? Experiencing erratic moods or irritability. ? Avoidance of new situations. ? Avoidance of  people. ? Extreme difficulty making decisions. How is this diagnosed? This condition is diagnosed based on your symptoms and medical history. You will also have a physical exam. Your health care provider may perform tests to rule out other possible causes of your symptoms. To be diagnosed with GAD, a person must have anxiety that:  Is out of his or her control.  Affects several different aspects of his or her life, such as work and relationships.  Causes distress that makes him or her unable to take part in normal activities.  Includes at least three symptoms of GAD, such as restlessness, fatigue, trouble concentrating, irritability, muscle tension, or sleep problems. Before your health care provider can confirm a diagnosis of GAD, these symptoms must be present more days than they are not, and they must last for 6 months or longer. How is this treated? This condition may be treated with:  Medicine. Antidepressant medicine is usually prescribed for long-term daily control. Anti-anxiety medicines may be added in severe cases, especially when panic attacks occur.  Talk therapy (psychotherapy). Certain types of talk therapy can be helpful in treating GAD by providing support, education, and guidance. Options include: ? Cognitive behavioral therapy (CBT). People learn coping skills and self-calming techniques to ease their physical symptoms. They learn to identify unrealistic thoughts and behaviors and to replace them with more appropriate thoughts and behaviors. ? Acceptance and commitment therapy (ACT). This treatment teaches people how to be mindful as a way to cope with unwanted thoughts and feelings. ? Biofeedback. This process trains you to manage your body's response (physiological response) through breathing techniques and relaxation methods. You will work with a therapist while machines are used to monitor your physical   symptoms.  Stress management techniques. These include yoga,  meditation, and exercise. A mental health specialist can help determine which treatment is best for you. Some people see improvement with one type of therapy. However, other people require a combination of therapies.   Follow these instructions at home: Lifestyle  Maintain a consistent routine and schedule.  Anticipate stressful situations. Create a plan, and allow extra time to work with your plan.  Practice stress management or self-calming techniques that you have learned from your therapist or your health care provider. General instructions  Take over-the-counter and prescription medicines only as told by your health care provider.  Understand that you are likely to have setbacks. Accept this and be kind to yourself as you persist to take better care of yourself.  Recognize and accept your accomplishments, even if you judge them as small.  Keep all follow-up visits as told by your health care provider. This is important. Contact a health care provider if:  Your symptoms do not get better.  Your symptoms get worse.  You have signs of depression, such as: ? A persistently sad or irritable mood. ? Loss of enjoyment in activities that used to bring you joy. ? Change in weight or eating. ? Changes in sleeping habits. ? Avoiding friends or family members. ? Loss of energy for normal tasks. ? Feelings of guilt or worthlessness. Get help right away if:  You have serious thoughts about hurting yourself or others. If you ever feel like you may hurt yourself or others, or have thoughts about taking your own life, get help right away. Go to your nearest emergency department or:  Call your local emergency services (911 in the [place]  Call a suicide crisis helpline, such as the National Suicide Prevention Lifeline at 1-800-273-8255. This is open 24 hours a day in the [place]  Text the Crisis Text Line at 741741 (in the [place] Summary  Generalized anxiety disorder (GAD) is a mental  health condition that involves worry that is not triggered by a specific event.  People with GAD often worry excessively about many things in their lives, such as their health and family.  GAD may cause symptoms such as restlessness, trouble concentrating, sleep problems, frequent sweating, nausea, diarrhea, headaches, and trembling or muscle twitching.  A mental health specialist can help determine which treatment is best for you. Some people see improvement with one type of therapy. However, other people require a combination of therapies. This information is not intended to replace advice given to you by your health care provider. Make sure you discuss any questions you have with your health care provider. Document Revised: 01/01/2019 Document Reviewed: 01/01/2019 Elsevier Patient Education  2021 Elsevier Inc.  

## 2020-04-08 NOTE — Progress Notes (Signed)
Subjective:    Patient ID: Sabrina Stein, female    DOB: [birth year], 70 y.o.   MRN: [id number]  Chief Complaint  Patient presents with  . Medical Management of Chronic Issues   PT presents to the office today for chronic follow up.  Gastroesophageal Reflux She complains of belching and heartburn. She reports no nausea. This is a chronic problem. The current episode started more than 1 year ago. The problem occurs rarely. The problem has been waxing and waning. Risk factors include obesity. She has tried a PPI for the symptoms. The treatment provided moderate relief.  Urinary Frequency  This is a chronic problem. The current episode started more than 1 year ago. The problem has been waxing and waning. The patient is experiencing no pain. Associated symptoms include frequency and urgency. Pertinent negatives include no hematuria, nausea or vomiting. Treatments tried: detrol La 4 mg. The treatment provided mild relief.  Insomnia Primary symptoms: difficulty falling asleep, no frequent awakening.  The current episode started more than one year. The onset quality is gradual. The problem occurs intermittently. PMH includes: depression.  Diabetes She presents for her follow-up diabetic visit. She has type 2 diabetes mellitus. Pertinent negatives for hypoglycemia include no headaches. Pertinent negatives for diabetes include no blurred vision and no foot paresthesias. Symptoms are stable. Pertinent negatives for diabetic complications include no CVA, heart disease, nephropathy or peripheral neuropathy. Risk factors for coronary artery disease include dyslipidemia, diabetes mellitus, sedentary lifestyle and post-menopausal. She is following a generally unhealthy diet. (Does not check BS at home) Eye exam is not current.  Hyperlipidemia This is a chronic problem. The current episode started more than 1 year ago. Exacerbating diseases include obesity. Current antihyperlipidemic treatment includes statins.  The current treatment provides moderate improvement of lipids. Risk factors for coronary artery disease include dyslipidemia, diabetes mellitus, a sedentary lifestyle and post-menopausal.  Depression        This is a chronic problem.  The current episode started more than 1 year ago.   The onset quality is gradual.   The problem occurs intermittently.  Associated symptoms include insomnia.  Associated symptoms include no helplessness, no hopelessness, not irritable, no restlessness, no headaches and not sad. Arthritis Presents for follow-up visit. She complains of pain and stiffness. Affected locations include the neck. Her pain is at a severity of 6/10.      Review of Systems  Eyes: Negative for blurred vision.  Gastrointestinal: Positive for heartburn. Negative for nausea and vomiting.  Genitourinary: Positive for frequency and urgency. Negative for hematuria.  Musculoskeletal: Positive for arthritis and stiffness.  Neurological: Negative for headaches.  Psychiatric/Behavioral: Positive for depression. The patient has insomnia.   All other systems reviewed and are negative.      Objective:   Physical Exam Vitals reviewed.  Constitutional:      General: She is not irritable.She is not in acute distress.    Appearance: She is well-developed and well-nourished.  HENT:     Head: Normocephalic and atraumatic.     Right Ear: Tympanic membrane normal.     Left Ear: Tympanic membrane normal.     Mouth/Throat:     Mouth: Oropharynx is clear and moist.  Eyes:     Pupils: Pupils are equal, round, and reactive to light.  Neck:     Thyroid: No thyromegaly.  Cardiovascular:     Rate and Rhythm: Normal rate and regular rhythm.     Pulses: Intact distal pulses.     Heart  sounds: Normal heart sounds. No murmur heard.   Pulmonary:     Effort: Pulmonary effort is normal. No respiratory distress.     Breath sounds: Normal breath sounds. No wheezing.  Abdominal:     General: Bowel sounds  are normal. There is no distension.     Palpations: Abdomen is soft.     Tenderness: There is no abdominal tenderness.  Musculoskeletal:        General: No tenderness or edema. Normal range of motion.     Cervical back: Normal range of motion and neck supple.  Skin:    General: Skin is warm and dry.  Neurological:     Mental Status: She is alert and oriented to person, place, and time.     Cranial Nerves: No cranial nerve deficit.     Deep Tendon Reflexes: Reflexes are normal and symmetric.  Psychiatric:        Mood and Affect: Mood and affect normal.        Behavior: Behavior normal.        Thought Content: Thought content normal.        Judgment: Judgment normal.       BP (!) 167/87   Pulse 78   Temp (!) 96.8 F (36 C) (Temporal)   Ht _0  (1.575 m)   Wt 216 lb 12.8 oz (98.3 kg)   BMI 39.65 kg/m      Assessment & Plan:  Sabrina Stein comes in today with chief complaint of Medical Management of Chronic Issues   Diagnosis and orders addressed:  1. Controlled substance agreement signed - ALPRAZolam (XANAX) 0.25 MG tablet; Take 1 tablet (0.25 mg total) by mouth at bedtime as needed for anxiety.  Dispense: 20 tablet; Refill: 1 - Suvorexant (BELSOMRA) 20 MG TABS; TAKE 1 TABLET BY MOUTH EVERYDAY AT BEDTIME  Dispense: 90 tablet; Refill: 1 - CMP14+EGFR - CBC with Differential/Platelet  2. GAD (generalized anxiety disorder) - ALPRAZolam (XANAX) 0.25 MG tablet; Take 1 tablet (0.25 mg total) by mouth at bedtime as needed for anxiety.  Dispense: 20 tablet; Refill: 1 - venlafaxine XR (EFFEXOR-XR) 75 MG 24 hr capsule; Take 1 capsule (75 mg total) by mouth daily.  Dispense: 90 capsule; Refill: 2 - CMP14+EGFR - CBC with Differential/Platelet  3. Primary insomnia - Suvorexant (BELSOMRA) 20 MG TABS; TAKE 1 TABLET BY MOUTH EVERYDAY AT BEDTIME  Dispense: 90 tablet; Refill: 1 - CMP14+EGFR - CBC with Differential/Platelet  4. Mild episode of recurrent major depressive disorder  (HCC) - venlafaxine XR (EFFEXOR-XR) 75 MG 24 hr capsule; Take 1 capsule (75 mg total) by mouth daily.  Dispense: 90 capsule; Refill: 2 - CMP14+EGFR - CBC with Differential/Platelet  5. Gastroesophageal reflux disease without esophagitis - omeprazole (PRILOSEC) 20 MG capsule; Take 1 capsule (20 mg total) by mouth daily. (Needs to be seen before next refill)  Dispense: 30 capsule; Refill: 0 - CMP14+EGFR - CBC with Differential/Platelet  6. Mixed hyperlipidemia - atorvastatin (LIPITOR) 20 MG tablet; Take 1 tablet (20 mg total) by mouth daily.  Dispense: 90 tablet; Refill: 2 - CMP14+EGFR - CBC with Differential/Platelet  7. Overactive bladder - tolterodine (DETROL LA) 4 MG 24 hr capsule; Take 1 capsule (4 mg total) by mouth daily.  Dispense: 30 capsule; Refill: 0 - CMP14+EGFR - CBC with Differential/Platelet  8. Restless leg - rOPINIRole (REQUIP) 0.25 MG tablet; Take 1-2 tablets (0.25-0.5 mg total) by mouth at bedtime.  Dispense: 180 tablet; Refill: 1 - CMP14+EGFR - CBC with Differential/Platelet  9.  Prediabetes - CMP14+EGFR - CBC with Differential/Platelet - Bayer DCA Hb A1c Waived  10. Morbid obesity ([place]) - CMP14+EGFR - CBC with Differential/Platelet   Labs pending Patient reviewed in [place] controlled database, no flags noted. Contract and drug screen are up to date.  Health Maintenance reviewed Diet and exercise encouraged  Follow up plan: 6 months    Evelina Dun, FNP

## 2020-04-09 LAB — CBC WITH DIFFERENTIAL/PLATELET
Basophils Absolute: 0.1 10*3/uL (ref 0.0–0.2)
Basos: 1 %
EOS (ABSOLUTE): 0.2 10*3/uL (ref 0.0–0.4)
Eos: 2 %
Hematocrit: 37.6 % (ref 34.0–46.6)
Hemoglobin: 12.3 g/dL (ref 11.1–15.9)
Immature Grans (Abs): 0 10*3/uL (ref 0.0–0.1)
Immature Granulocytes: 0 %
Lymphocytes Absolute: 2.3 10*3/uL (ref 0.7–3.1)
Lymphs: 32 %
MCH: 30.9 pg (ref 26.6–33.0)
MCHC: 32.7 g/dL (ref 31.5–35.7)
MCV: 95 fL (ref 79–97)
Monocytes Absolute: 0.6 10*3/uL (ref 0.1–0.9)
Monocytes: 8 %
Neutrophils Absolute: 4.1 10*3/uL (ref 1.4–7.0)
Neutrophils: 57 %
Platelets: 294 10*3/uL (ref 150–450)
RBC: 3.98 x10E6/uL (ref 3.77–5.28)
RDW: 12.6 % (ref 11.7–15.4)
WBC: 7.1 10*3/uL (ref 3.4–10.8)

## 2020-04-09 LAB — CMP14+EGFR
ALT: 27 IU/L (ref 0–32)
AST: 22 IU/L (ref 0–40)
Albumin/Globulin Ratio: 1.9 (ref 1.2–2.2)
Albumin: 4.2 g/dL (ref 3.8–4.8)
Alkaline Phosphatase: 151 IU/L — ABNORMAL HIGH (ref 44–121)
BUN/Creatinine Ratio: 22 (ref 12–28)
BUN: 16 mg/dL (ref 8–27)
Bilirubin Total: 0.2 mg/dL (ref 0.0–1.2)
CO2: 23 mmol/L (ref 20–29)
Calcium: 9 mg/dL (ref 8.7–10.3)
Chloride: 100 mmol/L (ref 96–106)
Creatinine, Ser: 0.74 mg/dL (ref 0.57–1.00)
GFR calc Af Amer: 96 mL/min/{1.73_m2} (ref >59)
GFR calc non Af Amer: 83 mL/min/{1.73_m2} (ref >59)
Globulin, Total: 2.2 g/dL (ref 1.5–4.5)
Glucose: 118 mg/dL — ABNORMAL HIGH (ref 65–99)
Potassium: 3.7 mmol/L (ref 3.5–5.2)
Sodium: 137 mmol/L (ref 134–144)
Total Protein: 6.4 g/dL (ref 6.0–8.5)

## 2020-05-01 ENCOUNTER — Other Ambulatory Visit: Payer: Self-pay | Admitting: Family

## 2020-05-01 DIAGNOSIS — K219 Gastro-esophageal reflux disease without esophagitis: Secondary | ICD-10-CM

## 2020-05-01 DIAGNOSIS — N3281 Overactive bladder: Secondary | ICD-10-CM

## 2020-07-04 ENCOUNTER — Other Ambulatory Visit: Payer: Self-pay | Admitting: Family

## 2020-07-04 DIAGNOSIS — M159 Polyosteoarthritis, unspecified: Secondary | ICD-10-CM

## 2020-07-27 ENCOUNTER — Other Ambulatory Visit: Payer: Self-pay | Admitting: Family

## 2020-07-27 DIAGNOSIS — M159 Polyosteoarthritis, unspecified: Secondary | ICD-10-CM

## 2020-07-28 ENCOUNTER — Encounter: Payer: Self-pay | Admitting: Family Medicine

## 2020-08-25 ENCOUNTER — Other Ambulatory Visit: Payer: Self-pay | Admitting: Family

## 2020-08-25 DIAGNOSIS — M159 Polyosteoarthritis, unspecified: Secondary | ICD-10-CM

## 2020-09-08 ENCOUNTER — Ambulatory Visit: Payer: Medicare PPO | Admitting: Nurse Practitioner

## 2020-09-08 ENCOUNTER — Encounter: Payer: Self-pay | Admitting: Nurse Practitioner

## 2020-09-08 ENCOUNTER — Other Ambulatory Visit: Payer: Self-pay

## 2020-09-08 ENCOUNTER — Ambulatory Visit (INDEPENDENT_AMBULATORY_CARE_PROVIDER_SITE_OTHER): Payer: Medicare PPO

## 2020-09-08 VITALS — BP 132/79 | HR 76 | Temp 97.8°F | Ht 62.0 in | Wt 215.6 lb

## 2020-09-08 DIAGNOSIS — M7732 Calcaneal spur, left foot: Secondary | ICD-10-CM | POA: Diagnosis not present

## 2020-09-08 DIAGNOSIS — M79672 Pain in left foot: Secondary | ICD-10-CM

## 2020-09-08 DIAGNOSIS — M19072 Primary osteoarthritis, left ankle and foot: Secondary | ICD-10-CM | POA: Diagnosis not present

## 2020-09-08 NOTE — Progress Notes (Signed)
Acute Office Visit  Subjective:    Patient ID: Sabrina Stein, female    DOB: 02-16-1951, 70 y.o.   MRN: [id number]  Chief Complaint  Patient presents with   Foot Pain    Left foot the heel. Been going on for a couple of mths no injury     HPI Patient is in today for Pain  She reports recurrent left foot pain pain. was not an injury that may have caused the pain. The pain started a few months ago and is worsening. The pain does not radiate . The pain is described as aching and burning, is moderate in intensity, occurring intermittently. Symptoms are worse in the: mid-day, afternoon  Aggravating factors: standing and walking Relieving factors: bending sideways and sitting.  She has tried application of ice with no relief.   ---------------------------------------------------------------------------------------------------   Past Medical History:  Diagnosis Date   Depression    Hyperlipidemia     Past Surgical History:  Procedure Laterality Date   APPENDECTOMY     CESAREAN SECTION     x2    Family History  Problem Relation Age of Onset   Cancer Father        lung   Healthy Sister    Healthy Daughter    Healthy Son     Social History   Socioeconomic History   Marital status: Married    Spouse name: Molly Maduro   Number of children: 2   Years of education: 18   Highest education level: Master's degree (e.g., MA, MS, MEng, MEd, MSW, MBA)  Occupational History   Occupation: Retired   Tobacco Use   Smoking status: Former    Packs/day: 1.00    Years: 43.00    Pack years: 43.00    Types: Cigarettes    Quit date: 01/26/2012    Years since quitting: 8.6   Smokeless tobacco: Never  Vaping Use   Vaping Use: Never used  Substance and Sexual Activity   Alcohol use: Yes    Comment: occassional-glass of wine once a month   Drug use: No   Sexual activity: Yes  Other Topics Concern   Not on file  Social History Narrative   Not on file   Social Determinants of Health    Financial Resource Strain: Low Risk    Difficulty of Paying Living Expenses: Not hard at all  Food Insecurity: No Food Insecurity   Worried About Programme researcher, broadcasting/film/video in the Last Year: Never true   Ran Out of Food in the Last Year: Never true  Transportation Needs: No Transportation Needs   Lack of Transportation (Medical): No   Lack of Transportation (Non-Medical): No  Physical Activity: Insufficiently Active   Days of Exercise per Week: 3 days   Minutes of Exercise per Session: 30 min  Stress: No Stress Concern Present   Feeling of Stress : Only a little  Social Connections: Press photographer of Communication with Friends and Family: More than three times a week   Frequency of Social Gatherings with Friends and Family: More than three times a week   Attends Religious Services: More than 4 times per year   Active Member of [employer] or Organizations: Yes   Attends Banker Meetings: 1 to 4 times per year   Marital Status: Married  Catering manager Violence: Not At Risk   Fear of Current or Ex-Partner: No   Emotionally Abused: No   Physically Abused: No   Sexually Abused: No  Outpatient Medications Prior to Visit  Medication Sig Dispense Refill   ALPRAZolam (XANAX) 0.25 MG tablet Take 1 tablet (0.25 mg total) by mouth at bedtime as needed for anxiety. 20 tablet 1   Ascorbic Acid (VITAMIN C) 1000 MG tablet Take 1,000 mg by mouth daily.     atorvastatin (LIPITOR) 20 MG tablet Take 1 tablet (20 mg total) by mouth daily. 90 tablet 2   Calcium Carbonate-Vit D-Min (CALCIUM 1200 PO) Take by mouth.     Cholecalciferol (VITAMIN D3) 1.25 MG (50000 UT) CAPS Take by mouth.     diclofenac (VOLTAREN) 75 MG EC tablet TAKE 1 TABLET BY MOUTH TWICE A DAY 60 tablet 0   omeprazole (PRILOSEC) 20 MG capsule Take 1 capsule (20 mg total) by mouth daily. 30 capsule 5   rOPINIRole (REQUIP) 0.25 MG tablet Take 1-2 tablets (0.25-0.5 mg total) by mouth at bedtime. 180 tablet 1    Suvorexant (BELSOMRA) 20 MG TABS TAKE 1 TABLET BY MOUTH EVERYDAY AT BEDTIME 90 tablet 1   tolterodine (DETROL LA) 4 MG 24 hr capsule TAKE 1 CAPSULE BY MOUTH EVERY DAY 30 capsule 5   venlafaxine XR (EFFEXOR-XR) 75 MG 24 hr capsule Take 1 capsule (75 mg total) by mouth daily. 90 capsule 2   zinc gluconate 50 MG tablet Take 50 mg by mouth daily.     No facility-administered medications prior to visit.    Allergies  Allergen Reactions   Codeine Nausea And Vomiting    Review of Systems  Constitutional: Negative.   HENT: Negative.    Respiratory: Negative.    Cardiovascular: Negative.   Gastrointestinal: Negative.   Genitourinary: Negative.   Skin:  Negative for rash.  All other systems reviewed and are negative.     Objective:    Physical Exam Vitals and nursing note reviewed.  Constitutional:      Appearance: Normal appearance. She is normal weight.  HENT:     Head: Normocephalic.  Eyes:     Conjunctiva/sclera: Conjunctivae normal.  Cardiovascular:     Rate and Rhythm: Normal rate and regular rhythm.  Pulmonary:     Effort: Pulmonary effort is normal.     Breath sounds: Normal breath sounds.  Abdominal:     General: Bowel sounds are normal.  Musculoskeletal:     Left foot: Normal range of motion. Tenderness present. No swelling or laceration.     Comments: Heel pain  Skin:    Findings: No rash.  Neurological:     Mental Status: She is alert and oriented to person, place, and time.    BP 132/79   Pulse 76   Temp 97.8 F (36.6 C) (Temporal)   Ht 5\' 2"  (1.575 m)   Wt 215 lb 9.6 oz (97.8 kg)   BMI 39.43 kg/m  Wt Readings from Last 3 Encounters:  09/08/20 215 lb 9.6 oz (97.8 kg)  04/08/20 216 lb 12.8 oz (98.3 kg)  09/24/19 213 lb 12.8 oz (97 kg)    Health Maintenance Due  Topic Date Due   OPHTHALMOLOGY EXAM  Never done   COVID-19 Vaccine (4 - Booster for Moderna series) 04/19/2020   URINE MICROALBUMIN  09/23/2020    There are no preventive care reminders  to display for this patient.   Lab Results  Component Value Date   TSH 1.330 02/07/2019   Lab Results  Component Value Date   WBC 7.1 04/08/2020   HGB 12.3 04/08/2020   HCT 37.6 04/08/2020   MCV 95 04/08/2020  PLT 294 04/08/2020   Lab Results  Component Value Date   NA 137 04/08/2020   K 3.7 04/08/2020   CO2 23 04/08/2020   GLUCOSE 118 (H) 04/08/2020   BUN 16 04/08/2020   CREATININE 0.74 04/08/2020   BILITOT 0.2 04/08/2020   ALKPHOS 151 (H) 04/08/2020   AST 22 04/08/2020   ALT 27 04/08/2020   PROT 6.4 04/08/2020   ALBUMIN 4.2 04/08/2020   CALCIUM 9.0 04/08/2020   Lab Results  Component Value Date   CHOL 166 09/24/2019   Lab Results  Component Value Date   HDL 59 09/24/2019   Lab Results  Component Value Date   LDLCALC 85 09/24/2019   Lab Results  Component Value Date   TRIG 122 09/24/2019   Lab Results  Component Value Date   CHOLHDL 2.8 09/24/2019   Lab Results  Component Value Date   HGBA1C 6.4 04/08/2020       Assessment & Plan:   Problem List Items Addressed This Visit       Other   Pain of left heel - Primary    Worsening left heel pain.  Patient reports symptoms have been present for few months.  On assessment left heel tenderness, patient may be experiencing heel spurs.  Nothing has been used in the past for symptom management.  X-ray completed results pending.  Advised patient to continue Voltaren 75 mg tablet by mouth daily.  Apply ice, and finding comfortable insoles to help protect arch. Follow-up with worsening or unresolved symptoms.       Relevant Orders   DG Foot Complete Left     No orders of the defined types were placed in this encounter.    Daryll Drown, NP

## 2020-09-08 NOTE — Patient Instructions (Signed)
Continue Voltaren 75 mg tablet by mouth twice daily.  Apply ice, rest he will, shoe inserts for arch support.  Follow-up with unresolved or worsening symptoms.   Heel Spur  A heel spur is a bony growth that forms on the bottom of the heel bone (calcaneus). Heel spurs are common. They often cause inflammation in the plantar fascia, which is the band of tissue that connects the toe bones to the heel bone. When the plantar fascia is inflamed, it is called plantar fasciitis. This may cause pain on the bottom of the foot, near the heel. Many people with plantar fasciitis also have heel spurs. However, spurs are not the cause of plantarfasciitis pain. What are the causes? The exact cause of heel spurs is not known. They may be caused by: Pressure on the heel bone. Bands of tissues that connect muscle to bone (tendons) pulling on the heel bone. What increases the risk? You are more likely to develop this condition if you: Are older than age 66. Are overweight. Have wear-and-tear arthritis (osteoarthritis). Have plantar fascia inflammation. Participate in sports or activities that include a lot of running or jumping. Wear poorly fitted shoes. What are the signs or symptoms? Some people have no symptoms. If you do have symptoms, they may include: Pain in the bottom of your heel. Pain that is worse when you first get out of bed. Pain that gets worse after walking or standing. How is this diagnosed? This condition may be diagnosed based on: Your symptoms and medical history. A physical exam. A foot X-ray. How is this treated? Treatment for this condition depends on how much pain you have. Treatment options may include: Doing stretching exercises and losing weight, if necessary. Wearing specific shoes or inserts inside of shoes (orthotics) for comfort and support. Wearing splints on your feet while you sleep. Splints keep your feet in a position (usually a 90-degree angle) that should prevent  and relieve the pain you feel when you first get out of bed. They also make stretching easier in the morning. Taking over-the-counter medicine to relieve pain, such as NSAIDs. Using high-intensity sound waves to break up the heel spur (extracorporeal shock wave therapy). Getting steroid injections in your heel to reduce inflammation. Having surgery, if your heel spur causes long-term (chronic) pain. Follow these instructions at home:  Activity Avoid activities that cause pain until you recover, or for as long as told by your health care provider. Do stretching exercises as told. Stretch before exercising or being physically active. Managing pain, stiffness, and swelling If directed, put ice on your foot. To do this: Put ice in a plastic bag. Place a towel between your skin and the bag. Leave the ice on for 20 minutes, 2-3 times a day. Remove the ice if your skin turns bright red. This is very important. If you cannot feel pain, heat, or cold, you have a greater risk of damage to the area. Move your toes often to reduce stiffness and swelling. When possible, raise (elevate) your foot above the level of your heart while you are sitting or lying down. General instructions Take over-the-counter and prescription medicines only as told by your health care provider. Wear supportive shoes that fit well. Wear splints, inserts, or orthotics as told by your health care provider. If recommended, work with your health care provider to lose weight. This can relieve pressure on your foot. Do not use any products that contain nicotine or tobacco, such as cigarettes, e-cigarettes, and chewing tobacco.  These can delay bone healing. If you need help quitting, ask your health care provider. Keep all follow-up visits. This is important. Where to find more information American Academy of Orthopaedic Surgeons: www.orthoinfo.aaos.org Contact a health care provider if: Your pain does not go away with  treatment. Your pain gets worse. Summary A heel spur is a bony growth that forms on the bottom of the heel bone (calcaneus). Heel spurs often cause inflammation in the plantar fascia, which is the band of tissue that connects the toes to the heel bone. This may cause pain on the bottom of the foot, near the heel. Doing stretching exercises, losing weight, wearing specific shoes or shoe inserts, wearing splints while you sleep, and taking pain medicine may ease the pain and stiffness. Other treatment options may include high-intensity sound waves to break up the heel spur, steroid injections, or surgery. This information is not intended to replace advice given to you by your health care provider. Make sure you discuss any questions you have with your healthcare provider. Document Revised: 07/08/2019 Document Reviewed: 07/08/2019 Elsevier Patient Education  2022 [employer].

## 2020-09-08 NOTE — Assessment & Plan Note (Signed)
Worsening left heel pain.  Patient reports symptoms have been present for few months.  On assessment left heel tenderness, patient may be experiencing heel spurs.  Nothing has been used in the past for symptom management.  X-ray completed results pending.  Advised patient to continue Voltaren 75 mg tablet by mouth daily.  Apply ice, and finding comfortable insoles to help protect arch. Follow-up with worsening or unresolved symptoms.

## 2020-09-25 ENCOUNTER — Other Ambulatory Visit: Payer: Self-pay | Admitting: Family

## 2020-09-25 DIAGNOSIS — M159 Polyosteoarthritis, unspecified: Secondary | ICD-10-CM

## 2020-10-28 ENCOUNTER — Other Ambulatory Visit: Payer: Self-pay | Admitting: Family

## 2020-10-28 DIAGNOSIS — G2581 Restless legs syndrome: Secondary | ICD-10-CM

## 2020-10-28 DIAGNOSIS — K219 Gastro-esophageal reflux disease without esophagitis: Secondary | ICD-10-CM

## 2020-11-23 ENCOUNTER — Other Ambulatory Visit: Payer: Self-pay | Admitting: Family

## 2020-11-23 DIAGNOSIS — K219 Gastro-esophageal reflux disease without esophagitis: Secondary | ICD-10-CM

## 2020-11-23 DIAGNOSIS — G2581 Restless legs syndrome: Secondary | ICD-10-CM

## 2020-11-24 ENCOUNTER — Other Ambulatory Visit: Payer: Self-pay | Admitting: Family

## 2020-11-24 DIAGNOSIS — N3281 Overactive bladder: Secondary | ICD-10-CM

## 2020-12-23 ENCOUNTER — Other Ambulatory Visit: Payer: Self-pay | Admitting: Family

## 2020-12-23 DIAGNOSIS — N3281 Overactive bladder: Secondary | ICD-10-CM

## 2020-12-23 NOTE — Telephone Encounter (Signed)
[facility]. NTBS 30 days given 11/24/20

## 2020-12-31 ENCOUNTER — Ambulatory Visit: Payer: Medicare PPO | Admitting: Family

## 2020-12-31 ENCOUNTER — Other Ambulatory Visit: Payer: Self-pay

## 2020-12-31 ENCOUNTER — Encounter: Payer: Self-pay | Admitting: Family

## 2020-12-31 VITALS — BP 136/79 | HR 67 | Temp 96.6°F | Ht 62.0 in | Wt 207.6 lb

## 2020-12-31 DIAGNOSIS — E782 Mixed hyperlipidemia: Secondary | ICD-10-CM | POA: Diagnosis not present

## 2020-12-31 DIAGNOSIS — Z23 Encounter for immunization: Secondary | ICD-10-CM | POA: Diagnosis not present

## 2020-12-31 DIAGNOSIS — R7303 Prediabetes: Secondary | ICD-10-CM

## 2020-12-31 DIAGNOSIS — F330 Major depressive disorder, recurrent, mild: Secondary | ICD-10-CM | POA: Diagnosis not present

## 2020-12-31 DIAGNOSIS — F5101 Primary insomnia: Secondary | ICD-10-CM | POA: Diagnosis not present

## 2020-12-31 DIAGNOSIS — M159 Polyosteoarthritis, unspecified: Secondary | ICD-10-CM | POA: Diagnosis not present

## 2020-12-31 DIAGNOSIS — F411 Generalized anxiety disorder: Secondary | ICD-10-CM

## 2020-12-31 DIAGNOSIS — G2581 Restless legs syndrome: Secondary | ICD-10-CM | POA: Diagnosis not present

## 2020-12-31 DIAGNOSIS — K219 Gastro-esophageal reflux disease without esophagitis: Secondary | ICD-10-CM

## 2020-12-31 DIAGNOSIS — N3281 Overactive bladder: Secondary | ICD-10-CM | POA: Diagnosis not present

## 2020-12-31 DIAGNOSIS — Z79899 Other long term (current) drug therapy: Secondary | ICD-10-CM | POA: Diagnosis not present

## 2020-12-31 LAB — BAYER DCA HB A1C WAIVED: HB A1C (BAYER DCA - WAIVED): 5.8 % — ABNORMAL HIGH (ref 4.8–5.6)

## 2020-12-31 MED ORDER — DICLOFENAC SODIUM 75 MG PO TBEC: 75.0000 mg | DELAYED_RELEASE_TABLET | Freq: Two times a day (BID) | ORAL | 2 refills | Status: DC

## 2020-12-31 MED ORDER — ATORVASTATIN CALCIUM 20 MG PO TABS: 20.0000 mg | ORAL_TABLET | Freq: Every day | ORAL | 2 refills | Status: DC

## 2020-12-31 MED ORDER — TOLTERODINE TARTRATE ER 4 MG PO CP24: 4.0000 mg | ORAL_CAPSULE | Freq: Every day | ORAL | 0 refills | Status: DC

## 2020-12-31 MED ORDER — ROPINIROLE HCL 0.25 MG PO TABS: 0.2500 mg | ORAL_TABLET | Freq: Every day | ORAL | 0 refills | Status: DC

## 2020-12-31 MED ORDER — VENLAFAXINE HCL ER 75 MG PO CP24: 75.0000 mg | ORAL_CAPSULE | Freq: Every day | ORAL | 2 refills | Status: DC

## 2020-12-31 MED ORDER — ALPRAZOLAM 0.25 MG PO TABS: 0.2500 mg | ORAL_TABLET | Freq: Every evening | ORAL | 1 refills | Status: DC | PRN

## 2020-12-31 NOTE — Progress Notes (Signed)
Subjective:    Patient ID: Sabrina Stein, female    DOB: 07-15-1950, 69 y.o.   MRN: [id number]  Chief Complaint  Patient presents with   Medical Management of Chronic Issues   PT presents to the office today for chronic follow up. She is currently taking CBD gummies for sleep and working well. She is prediabetic for the last 10 years. Doing well with this.  Gastroesophageal Reflux She complains of belching, dysphagia and heartburn. This is a chronic problem. The current episode started more than 1 year ago. The problem occurs occasionally. The problem has been waxing and waning. The symptoms are aggravated by certain foods. She has tried a PPI for the symptoms.  Anxiety Presents for follow-up visit. Symptoms include depressed mood, excessive worry, insomnia, irritability, nervous/anxious behavior and restlessness. Symptoms occur most days. The severity of symptoms is moderate.    Hyperlipidemia This is a chronic problem. The current episode started more than 1 year ago. The problem is controlled. Recent lipid tests were reviewed and are normal. Exacerbating diseases include obesity. Current antihyperlipidemic treatment includes statins. The current treatment provides moderate improvement of lipids.  Arthritis Presents for follow-up visit. She complains of pain and stiffness. The symptoms have been stable. Affected locations include the left knee, right knee, left foot and right foot. Her pain is at a severity of 0/10.  Insomnia Primary symptoms: difficulty falling asleep, frequent awakening.   The current episode started more than one year. The onset quality is gradual. The problem occurs intermittently. Past treatments include medication. The treatment provided moderate relief. PMH includes: depression.   Depression        This is a chronic problem.  The current episode started more than 1 year ago.   The onset quality is gradual.   The problem occurs intermittently.  Associated symptoms  include insomnia and restlessness.  Associated symptoms include no helplessness, no hopelessness and not sad.  Past medical history includes anxiety.   Urinary Frequency  This is a chronic problem. The current episode started more than 1 year ago. The problem occurs intermittently. The problem has been waxing and waning. The pain is at a severity of 0/10. The patient is experiencing no pain. Associated symptoms include frequency.  RLS  Taking Requip 0.25 mg nightly. States if she does not take this medications her legs are constantly moving.   Review of Systems  Constitutional:  Positive for irritability.  Gastrointestinal:  Positive for dysphagia and heartburn.  Genitourinary:  Positive for frequency.  Musculoskeletal:  Positive for arthritis and stiffness.  Psychiatric/Behavioral:  Positive for depression. The patient is nervous/anxious and has insomnia.   All other systems reviewed and are negative.     Objective:   Physical Exam Vitals reviewed.  Constitutional:      General: She is not in acute distress.    Appearance: She is well-developed. She is obese.  HENT:     Head: Normocephalic and atraumatic.     Right Ear: Tympanic membrane normal.     Left Ear: Tympanic membrane normal.  Eyes:     Pupils: Pupils are equal, round, and reactive to light.  Neck:     Thyroid: No thyromegaly.  Cardiovascular:     Rate and Rhythm: Normal rate and regular rhythm.     Heart sounds: Normal heart sounds. No murmur heard. Pulmonary:     Effort: Pulmonary effort is normal. No respiratory distress.     Breath sounds: Normal breath sounds. No wheezing.  Abdominal:  General: Bowel sounds are normal. There is no distension.     Palpations: Abdomen is soft.     Tenderness: There is no abdominal tenderness.  Musculoskeletal:        General: No tenderness. Normal range of motion.     Cervical back: Normal range of motion and neck supple.  Skin:    General: Skin is warm and dry.   Neurological:     Mental Status: She is alert and oriented to person, place, and time.     Cranial Nerves: No cranial nerve deficit.     Deep Tendon Reflexes: Reflexes are normal and symmetric.  Psychiatric:        Behavior: Behavior normal.        Thought Content: Thought content normal.        Judgment: Judgment normal.     BP 136/79   Pulse 67   Temp (!) 96.6 F (35.9 C) (Temporal)   Ht _0  (1.575 m)   Wt 207 lb 9.6 oz (94.2 kg)   BMI 37.97 kg/m       Assessment & Plan:  Randy Whitener comes in today with chief complaint of Medical Management of Chronic Issues   Diagnosis and orders addressed:  1. Controlled substance agreement signed - ALPRAZolam (XANAX) 0.25 MG tablet; Take 1 tablet (0.25 mg total) by mouth at bedtime as needed for anxiety.  Dispense: 20 tablet; Refill: 1 - CMP14+EGFR - CBC with Differential/Platelet - ToxASSURE Select 13 (MW), Urine  2. GAD (generalized anxiety disorder) - ALPRAZolam (XANAX) 0.25 MG tablet; Take 1 tablet (0.25 mg total) by mouth at bedtime as needed for anxiety.  Dispense: 20 tablet; Refill: 1 - venlafaxine XR (EFFEXOR-XR) 75 MG 24 hr capsule; Take 1 capsule (75 mg total) by mouth daily.  Dispense: 90 capsule; Refill: 2 - CMP14+EGFR - CBC with Differential/Platelet - ToxASSURE Select 13 (MW), Urine  3. Mild episode of recurrent major depressive disorder (HCC) - venlafaxine XR (EFFEXOR-XR) 75 MG 24 hr capsule; Take 1 capsule (75 mg total) by mouth daily.  Dispense: 90 capsule; Refill: 2 - CMP14+EGFR - CBC with Differential/Platelet  4. Mixed hyperlipidemia - atorvastatin (LIPITOR) 20 MG tablet; Take 1 tablet (20 mg total) by mouth daily.  Dispense: 90 tablet; Refill: 2 - CMP14+EGFR - CBC with Differential/Platelet  5. Osteoarthritis of multiple joints, unspecified osteoarthritis type - diclofenac (VOLTAREN) 75 MG EC tablet; Take 1 tablet (75 mg total) by mouth 2 (two) times daily.  Dispense: 60 tablet; Refill: 2 -  CMP14+EGFR - CBC with Differential/Platelet  6. Overactive bladder - tolterodine (DETROL LA) 4 MG 24 hr capsule; Take 1 capsule (4 mg total) by mouth daily. (NEEDS TO BE SEEN BEFORE NEXT REFILL)  Dispense: 30 capsule; Refill: 0 - CMP14+EGFR - CBC with Differential/Platelet  7. Restless leg - rOPINIRole (REQUIP) 0.25 MG tablet; Take 1-2 tablets (0.25-0.5 mg total) by mouth at bedtime. (NEEDS TO BE SEEN BEFORE NEXT REFILL)  Dispense: 60 tablet; Refill: 0 - CMP14+EGFR - CBC with Differential/Platelet  8. Gastroesophageal reflux disease without esophagitis - CMP14+EGFR - CBC with Differential/Platelet  9. Primary insomnia - CMP14+EGFR - CBC with Differential/Platelet  10. Morbid obesity ([place]) - CMP14+EGFR - CBC with Differential/Platelet  11. Prediabetes - Bayer DCA Hb A1c Waived - CMP14+EGFR - CBC with Differential/Platelet - Microalbumin / creatinine urine ratio   Labs pending Patient reviewed in [place] controlled database, no flags noted. Contract and drug screen up dated today.Marland Kitchen  Health Maintenance reviewed Diet and exercise encouraged  Follow up plan: 6 months    Evelina Dun, FNP

## 2020-12-31 NOTE — Addendum Note (Signed)
Addended by: Austin Miles F on: 12/31/2020 11:41 AM   Modules accepted: Orders

## 2020-12-31 NOTE — Patient Instructions (Signed)
Health Maintenance After Age 70 After age 70, you are at a higher risk for certain long-term diseases and infections as well as injuries from falls. Falls are a major cause of broken bones and head injuries in people who are older than age 70. Getting regular preventive care can help to keep you healthy and well. Preventive care includes getting regular testing and making lifestyle changes as recommended by your health care provider. Talk with your health care provider about: Which screenings and tests you should have. A screening is a test that checks for a disease when you have no symptoms. A diet and exercise plan that is right for you. What should I know about screenings and tests to prevent falls? Screening and testing are the best ways to find a health problem early. Early diagnosis and treatment give you the best chance of managing medical conditions that are common after age 70. Certain conditions and lifestyle choices may make you more likely to have a fall. Your health care provider may recommend: Regular vision checks. Poor vision and conditions such as cataracts can make you more likely to have a fall. If you wear glasses, make sure to get your prescription updated if your vision changes. Medicine review. Work with your health care provider to regularly review all of the medicines you are taking, including over-the-counter medicines. Ask your health care provider about any side effects that may make you more likely to have a fall. Tell your health care provider if any medicines that you take make you feel dizzy or sleepy. Osteoporosis screening. Osteoporosis is a condition that causes the bones to get weaker. This can make the bones weak and cause them to break more easily. Blood pressure screening. Blood pressure changes and medicines to control blood pressure can make you feel dizzy. Strength and balance checks. Your health care provider may recommend certain tests to check your strength and  balance while standing, walking, or changing positions. Foot health exam. Foot pain and numbness, as well as not wearing proper footwear, can make you more likely to have a fall. Depression screening. You may be more likely to have a fall if you have a fear of falling, feel emotionally low, or feel unable to do activities that you used to do. Alcohol use screening. Using too much alcohol can affect your balance and may make you more likely to have a fall. What actions can I take to lower my risk of falls? General instructions Talk with your health care provider about your risks for falling. Tell your health care provider if: You fall. Be sure to tell your health care provider about all falls, even ones that seem minor. You feel dizzy, sleepy, or off-balance. Take over-the-counter and prescription medicines only as told by your health care provider. These include any supplements. Eat a healthy diet and maintain a healthy weight. A healthy diet includes low-fat dairy products, low-fat (lean) meats, and fiber from whole grains, beans, and lots of fruits and vegetables. Home safety Remove any tripping hazards, such as rugs, cords, and clutter. Install safety equipment such as grab bars in bathrooms and safety rails on stairs. Keep rooms and walkways well-lit. Activity  Follow a regular exercise program to stay fit. This will help you maintain your balance. Ask your health care provider what types of exercise are appropriate for you. If you need a cane or walker, use it as recommended by your health care provider. Wear supportive shoes that have nonskid soles. Lifestyle Do not   drink alcohol if your health care provider tells you not to drink. If you drink alcohol, limit how much you have: 0-1 drink a day for women. 0-2 drinks a day for men. Be aware of how much alcohol is in your drink. In the [place] one drink equals one typical bottle of beer (12 oz), one-half glass of wine (5 oz), or one shot of  hard liquor (1 oz). Do not use any products that contain nicotine or tobacco, such as cigarettes and e-cigarettes. If you need help quitting, ask your health care provider. Summary Having a healthy lifestyle and getting preventive care can help to protect your health and wellness after age 70. Screening and testing are the best way to find a health problem early and help you avoid having a fall. Early diagnosis and treatment give you the best chance for managing medical conditions that are more common for people who are older than age 70. Falls are a major cause of broken bones and head injuries in people who are older than age 70. Take precautions to prevent a fall at home. Work with your health care provider to learn what changes you can make to improve your health and wellness and to prevent falls. This information is not intended to replace advice given to you by your health care provider. Make sure you discuss any questions you have with your health care provider. Document Revised: 05/21/2020 Document Reviewed: 02/27/2020 Elsevier Patient Education  2022 [employer]  

## 2021-01-01 LAB — CBC WITH DIFFERENTIAL/PLATELET
Basophils Absolute: 0.1 10*3/uL (ref 0.0–0.2)
Basos: 1 %
EOS (ABSOLUTE): 0.2 10*3/uL (ref 0.0–0.4)
Eos: 2 %
Hematocrit: 37.3 % (ref 34.0–46.6)
Hemoglobin: 12.5 g/dL (ref 11.1–15.9)
Immature Grans (Abs): 0 10*3/uL (ref 0.0–0.1)
Immature Granulocytes: 0 %
Lymphocytes Absolute: 1.9 10*3/uL (ref 0.7–3.1)
Lymphs: 23 %
MCH: 31.1 pg (ref 26.6–33.0)
MCHC: 33.5 g/dL (ref 31.5–35.7)
MCV: 93 fL (ref 79–97)
Monocytes Absolute: 0.5 10*3/uL (ref 0.1–0.9)
Monocytes: 6 %
Neutrophils Absolute: 5.5 10*3/uL (ref 1.4–7.0)
Neutrophils: 68 %
Platelets: 422 10*3/uL (ref 150–450)
RBC: 4.02 x10E6/uL (ref 3.77–5.28)
RDW: 12.5 % (ref 11.7–15.4)
WBC: 8.1 10*3/uL (ref 3.4–10.8)

## 2021-01-01 LAB — CMP14+EGFR
ALT: 22 IU/L (ref 0–32)
AST: 17 IU/L (ref 0–40)
Albumin/Globulin Ratio: 2.3 — ABNORMAL HIGH (ref 1.2–2.2)
Albumin: 4.4 g/dL (ref 3.8–4.8)
Alkaline Phosphatase: 139 IU/L — ABNORMAL HIGH (ref 44–121)
BUN/Creatinine Ratio: 19 (ref 12–28)
BUN: 17 mg/dL (ref 8–27)
Bilirubin Total: 0.4 mg/dL (ref 0.0–1.2)
CO2: 23 mmol/L (ref 20–29)
Calcium: 9.4 mg/dL (ref 8.7–10.3)
Chloride: 101 mmol/L (ref 96–106)
Creatinine, Ser: 0.91 mg/dL (ref 0.57–1.00)
Globulin, Total: 1.9 g/dL (ref 1.5–4.5)
Glucose: 95 mg/dL (ref 70–99)
Potassium: 4.6 mmol/L (ref 3.5–5.2)
Sodium: 139 mmol/L (ref 134–144)
Total Protein: 6.3 g/dL (ref 6.0–8.5)
eGFR: 68 mL/min/{1.73_m2} (ref >59)

## 2021-01-02 LAB — MICROALBUMIN / CREATININE URINE RATIO
Creatinine, Urine: 89.4 mg/dL
Microalb/Creat Ratio: 6 mg/g creat (ref 0–29)
Microalbumin, Urine: 5.8 ug/mL

## 2021-01-03 ENCOUNTER — Other Ambulatory Visit: Payer: Self-pay | Admitting: Family

## 2021-01-03 ENCOUNTER — Telehealth: Payer: Self-pay | Admitting: Family

## 2021-01-03 DIAGNOSIS — K219 Gastro-esophageal reflux disease without esophagitis: Secondary | ICD-10-CM

## 2021-01-03 MED ORDER — OMEPRAZOLE 40 MG PO CPDR: 40.0000 mg | DELAYED_RELEASE_CAPSULE | Freq: Every day | ORAL | 1 refills | Status: DC

## 2021-01-03 NOTE — Telephone Encounter (Signed)
Prescription sent to pharmacy.

## 2021-01-13 LAB — DRUG SCREEN 10 W/CONF, SERUM
Amphetamines, IA: NEGATIVE ng/mL
Barbiturates, IA: NEGATIVE ug/mL
Benzodiazepines, IA: NEGATIVE ng/mL
Cocaine & Metabolite, IA: NEGATIVE ng/mL
Methadone, IA: NEGATIVE ng/mL
Opiates, IA: NEGATIVE ng/mL
Oxycodones, IA: NEGATIVE ng/mL
Phencyclidine, IA: NEGATIVE ng/mL
Propoxyphene, IA: NEGATIVE ng/mL
THC(Marijuana) Metabolite, IA: POSITIVE ng/mL — AB

## 2021-01-13 LAB — THC,MS,WB/SP RFX
Cannabidiol: NEGATIVE ng/mL
Cannabinoid Confirmation: POSITIVE
Cannabinol: NEGATIVE ng/mL
Carboxy-THC: 43.3 ng/mL
Hydroxy-THC: NEGATIVE ng/mL
Tetrahydrocannabinol(THC): NEGATIVE ng/mL

## 2021-01-24 ENCOUNTER — Other Ambulatory Visit: Payer: Self-pay | Admitting: Family

## 2021-01-24 DIAGNOSIS — N3281 Overactive bladder: Secondary | ICD-10-CM

## 2021-01-24 DIAGNOSIS — G2581 Restless legs syndrome: Secondary | ICD-10-CM

## 2021-02-08 ENCOUNTER — Telehealth: Payer: Self-pay | Admitting: Family

## 2021-02-08 NOTE — Telephone Encounter (Signed)
Left message for patient to call back and schedule Medicare Annual Wellness Visit (AWV) either virtually or phone I left my number for patient to call [phone].  Also left office number  Last AWV  02/12/20 ; please schedule at anytime with health coach  This should be a 45 minute visit.

## 2021-02-09 NOTE — Telephone Encounter (Signed)
Returned patients call.

## 2021-02-13 ENCOUNTER — Other Ambulatory Visit: Payer: Self-pay | Admitting: Family

## 2021-02-13 DIAGNOSIS — K219 Gastro-esophageal reflux disease without esophagitis: Secondary | ICD-10-CM

## 2021-03-10 ENCOUNTER — Telehealth: Payer: Self-pay | Admitting: Family

## 2021-03-23 NOTE — Telephone Encounter (Signed)
Schedule awv  

## 2021-03-29 ENCOUNTER — Other Ambulatory Visit: Payer: Self-pay | Admitting: Family

## 2021-03-29 DIAGNOSIS — H40033 Anatomical narrow angle, bilateral: Secondary | ICD-10-CM | POA: Diagnosis not present

## 2021-03-29 DIAGNOSIS — H04123 Dry eye syndrome of bilateral lacrimal glands: Secondary | ICD-10-CM | POA: Diagnosis not present

## 2021-03-29 DIAGNOSIS — M159 Polyosteoarthritis, unspecified: Secondary | ICD-10-CM

## 2021-04-03 ENCOUNTER — Other Ambulatory Visit: Payer: Self-pay | Admitting: Family

## 2021-04-03 DIAGNOSIS — K219 Gastro-esophageal reflux disease without esophagitis: Secondary | ICD-10-CM

## 2021-04-07 DIAGNOSIS — H25811 Combined forms of age-related cataract, right eye: Secondary | ICD-10-CM | POA: Diagnosis not present

## 2021-04-07 DIAGNOSIS — Z01818 Encounter for other preprocedural examination: Secondary | ICD-10-CM | POA: Diagnosis not present

## 2021-04-21 DIAGNOSIS — H25811 Combined forms of age-related cataract, right eye: Secondary | ICD-10-CM | POA: Diagnosis not present

## 2021-05-05 DIAGNOSIS — H25812 Combined forms of age-related cataract, left eye: Secondary | ICD-10-CM | POA: Diagnosis not present

## 2021-05-12 DIAGNOSIS — H04123 Dry eye syndrome of bilateral lacrimal glands: Secondary | ICD-10-CM | POA: Diagnosis not present

## 2021-07-01 ENCOUNTER — Other Ambulatory Visit: Payer: Self-pay | Admitting: Family

## 2021-07-01 DIAGNOSIS — M159 Polyosteoarthritis, unspecified: Secondary | ICD-10-CM

## 2021-07-04 NOTE — Telephone Encounter (Signed)
Needs 6  mth with pcp per last office note ?

## 2021-07-04 NOTE — Telephone Encounter (Signed)
Appt made for 5/2 ?

## 2021-07-06 ENCOUNTER — Other Ambulatory Visit: Payer: Self-pay | Admitting: *Deleted

## 2021-07-06 MED ORDER — OMEPRAZOLE 40 MG PO CPDR: 40.0000 mg | DELAYED_RELEASE_CAPSULE | Freq: Every day | ORAL | 1 refills | Status: DC

## 2021-07-19 ENCOUNTER — Other Ambulatory Visit: Payer: Self-pay | Admitting: Family

## 2021-07-19 DIAGNOSIS — N3281 Overactive bladder: Secondary | ICD-10-CM

## 2021-07-26 ENCOUNTER — Encounter: Payer: Self-pay | Admitting: Family

## 2021-07-26 ENCOUNTER — Ambulatory Visit: Payer: Medicare PPO | Admitting: Family

## 2021-07-26 VITALS — BP 139/74 | HR 68 | Temp 96.8°F | Resp 16 | Ht 62.0 in | Wt 202.2 lb

## 2021-07-26 DIAGNOSIS — N3946 Mixed incontinence: Secondary | ICD-10-CM

## 2021-07-26 DIAGNOSIS — M159 Polyosteoarthritis, unspecified: Secondary | ICD-10-CM | POA: Diagnosis not present

## 2021-07-26 DIAGNOSIS — E782 Mixed hyperlipidemia: Secondary | ICD-10-CM

## 2021-07-26 DIAGNOSIS — N3281 Overactive bladder: Secondary | ICD-10-CM | POA: Diagnosis not present

## 2021-07-26 DIAGNOSIS — F411 Generalized anxiety disorder: Secondary | ICD-10-CM | POA: Diagnosis not present

## 2021-07-26 DIAGNOSIS — F5101 Primary insomnia: Secondary | ICD-10-CM

## 2021-07-26 DIAGNOSIS — Z79899 Other long term (current) drug therapy: Secondary | ICD-10-CM

## 2021-07-26 DIAGNOSIS — K219 Gastro-esophageal reflux disease without esophagitis: Secondary | ICD-10-CM | POA: Diagnosis not present

## 2021-07-26 DIAGNOSIS — F330 Major depressive disorder, recurrent, mild: Secondary | ICD-10-CM

## 2021-07-26 MED ORDER — ALPRAZOLAM 0.25 MG PO TABS: 0.2500 mg | ORAL_TABLET | Freq: Every evening | ORAL | 1 refills | Status: DC | PRN

## 2021-07-26 NOTE — Progress Notes (Addendum)
? ?Subjective:  ? ? Patient ID: Sabrina Stein, female    DOB: April 28, 1950, 71 y.o.   MRN: [id number] ? ?Chief Complaint  ?Patient presents with  ? Medical Management of Chronic Issues  ? ?PT presents to the office today for chronic follow up. She is currently taking CBD gummies for sleep and working well. She is prediabetic for the last 10 years. Doing well with this.  ? ?She is morbid obese with a BMI of 36 and hyperlipidemia and depression.  ?Gastroesophageal Reflux ?She complains of belching and heartburn. This is a chronic problem. The current episode started more than 1 year ago. The problem occurs rarely. The problem has been waxing and waning. Risk factors include obesity. She has tried a PPI for the symptoms. The treatment provided significant relief.  ?Arthritis ?Presents for follow-up visit. She complains of pain and stiffness. The symptoms have been resolved. Her pain is at a severity of 0/10.  ?Urinary Frequency  ?This is a chronic problem. The current episode started more than 1 year ago. The problem occurs intermittently. The pain is at a severity of 0/10. The patient is experiencing no pain. Associated symptoms include frequency and urgency. Pertinent negatives include no hematuria. She has tried increased fluids for the symptoms. The treatment provided mild relief.  ?Insomnia ?Primary symptoms: difficulty falling asleep, frequent awakening.   ?The current episode started more than one year. The onset quality is gradual. The problem occurs intermittently. PMH includes: depression.   ?Hyperlipidemia ?This is a chronic problem. The current episode started more than 1 year ago. The problem is controlled. Exacerbating diseases include obesity. Current antihyperlipidemic treatment includes statins. The current treatment provides moderate improvement of lipids. Risk factors for coronary artery disease include dyslipidemia, a sedentary lifestyle and post-menopausal.  ?Depression ?       This is a chronic  problem.  The current episode started more than 1 year ago.   The problem occurs rarely.  Associated symptoms include insomnia.  Associated symptoms include no helplessness, no hopelessness, not irritable, no restlessness and not sad.  Past treatments include SNRIs - Serotonin and norepinephrine reuptake inhibitors.  Compliance with treatment is good.  Past medical history includes anxiety.   ?Anxiety ?Presents for follow-up visit. Symptoms include excessive worry, insomnia, irritability and nervous/anxious behavior. Patient reports no restlessness.  ? ? ? ? ? ?Current opioids rx- Xanax 0.25 mg  ?# meds rx- 20 ?Effectiveness of current meds-stable, only takes as needed. Has not had refilled since 12/31/20 ? ?Pill count performed-No ?Last drug screen - 12/31/20 ?( high risk q31m, moderate risk q33m, low risk yearly ) ?Urine drug screen today- no ?Was the [facility] reviewed- yes ? If yes were their any concerning findings? - none, has not refilled since 10/22 ? ? ?Pain contract signed on: 01/03/21 ? ?Review of Systems  ?Constitutional:  Positive for irritability.  ?Gastrointestinal:  Positive for heartburn.  ?Genitourinary:  Positive for frequency and urgency. Negative for hematuria.  ?Musculoskeletal:  Positive for arthritis and stiffness.  ?Psychiatric/Behavioral:  Positive for depression. The patient is nervous/anxious and has insomnia.   ?All other systems reviewed and are negative. ? ?   ?Objective:  ? Physical Exam ?Vitals reviewed.  ?Constitutional:   ?   General: She is not irritable.She is not in acute distress. ?   Appearance: She is well-developed. She is obese.  ?HENT:  ?   Head: Normocephalic and atraumatic.  ?   Right Ear: Tympanic membrane normal.  ?   Left Ear:  Tympanic membrane normal.  ?Eyes:  ?   Pupils: Pupils are equal, round, and reactive to light.  ?Neck:  ?   Thyroid: No thyromegaly.  ?Cardiovascular:  ?   Rate and Rhythm: Normal rate and regular rhythm.  ?   Heart sounds: Normal heart sounds. No  murmur heard. ?Pulmonary:  ?   Effort: Pulmonary effort is normal. No respiratory distress.  ?   Breath sounds: Normal breath sounds. No wheezing.  ?Abdominal:  ?   General: Bowel sounds are normal. There is no distension.  ?   Palpations: Abdomen is soft.  ?   Tenderness: There is no abdominal tenderness.  ?Musculoskeletal:     ?   General: No tenderness. Normal range of motion.  ?   Cervical back: Normal range of motion and neck supple.  ?Skin: ?   General: Skin is warm and dry.  ?Neurological:  ?   Mental Status: She is alert and oriented to person, place, and time.  ?   Cranial Nerves: No cranial nerve deficit.  ?   Deep Tendon Reflexes: Reflexes are normal and symmetric.  ?Psychiatric:     ?   Behavior: Behavior normal.     ?   Thought Content: Thought content normal.     ?   Judgment: Judgment normal.  ? ? ? ? ?BP 139/74   Pulse 68   Temp (!) 96.8 ?F (36 ?C)   Resp 16   Ht 5\' 2"  (1.575 m)   Wt 202 lb 3.2 oz (91.7 kg)   SpO2 98%   BMI 36.98 kg/m?  ? ?   ?Assessment & Plan:  ?Cameshia Cressman comes in today with chief complaint of Medical Management of Chronic Issues ? ? ?Diagnosis and orders addressed: ? ?1. Controlled substance agreement signed ?- ALPRAZolam (XANAX) 0.25 MG tablet; Take 1 tablet (0.25 mg total) by mouth at bedtime as needed for anxiety.  Dispense: 20 tablet; Refill: 1 ? ?2. GAD (generalized anxiety disorder) ?- ALPRAZolam (XANAX) 0.25 MG tablet; Take 1 tablet (0.25 mg total) by mouth at bedtime as needed for anxiety.  Dispense: 20 tablet; Refill: 1 ? ?3. Mixed stress and urge urinary incontinence ?-Kegal exercises discussed  ?- Ambulatory referral to Urology ? ?4. Gastroesophageal reflux disease without esophagitis ? ?5. Osteoarthritis of multiple joints, unspecified osteoarthritis type ? ?6. Overactive bladder ? ?7. Primary insomnia ? ?8. Morbid obesity (HCC) ? ?9. Mixed hyperlipidemia ? ?10. Mild episode of recurrent major depressive disorder (HCC) ? ? ?Labs pending ?Patient reviewed in  [place] controlled database, no flags noted. Contract and drug screen are up to date. ?Health Maintenance reviewed ?Diet and exercise encouraged ? ?Follow up plan: ?6 months  ? ? ?Sabrina Garter, FNP ? ? ?

## 2021-07-26 NOTE — Patient Instructions (Signed)
Urinary Incontinence Urinary incontinence refers to a condition in which a person is unable to control where and when to pass urine. A person with this condition will urinate involuntarily. This means that the person urinates when he or she does not mean to. What are the causes? This condition may be caused by: Medicines. Infections. Constipation. Overactive bladder muscles. Weak bladder muscles. Weak pelvic floor muscles. These muscles provide support for the bladder, intestine, and, in women, the uterus. Enlarged prostate in men. The prostate is a gland near the bladder. When it gets too big, it can pinch the urethra. With the urethra blocked, the bladder can weaken and lose the ability to empty properly. Surgery. Emotional factors, such as anxiety, stress, or post-traumatic stress disorder (PTSD). Spinal cord injury, nerve injury, or other neurological conditions. Pelvic organ prolapse. This happens in women when organs move out of place and into the vagina. This movement can prevent the bladder and urethra from working properly. What increases the risk? The following factors may make you more likely to develop this condition: Age. The older you are, the higher the risk. Obesity. Being physically inactive. Pregnancy and childbirth. Menopause. Diseases that affect the nerves or spinal cord. Long-term, or chronic, coughing. This can increase pressure on the bladder and pelvic floor muscles. What are the signs or symptoms? Symptoms may vary depending on the type of urinary incontinence you have. They include: A sudden urge to urinate, and passing urine involuntarily before you can get to a bathroom (urge incontinence). Suddenly passing urine when doing activities that force urine to pass, such as coughing, laughing, exercising, or sneezing (stress incontinence). Needing to urinate often but urinating only a small amount, or constantly dribbling urine (overflow incontinence). Urinating  because you cannot get to the bathroom in time due to a physical disability, such as arthritis or injury, or due to a communication or thinking problem, such as Alzheimer's disease (functional incontinence). How is this diagnosed? This condition may be diagnosed based on: Your medical history. A physical exam. Tests, such as: Urine tests. X-rays of your kidney and bladder. Ultrasound. CT scan. Cystoscopy. In this procedure, a health care provider inserts a tube with a light and camera (cystoscope) through the urethra and into the bladder to check for problems. Urodynamic testing. These tests assess how well the bladder, urethra, and sphincter can store and release urine. There are different types of urodynamic tests, and they vary depending on what the test is measuring. To help diagnose your condition, your health care provider may recommend that you keep a log of when you urinate and how much you urinate. How is this treated? Treatment for this condition depends on the type of incontinence that you have and its cause. Treatment may include: Lifestyle changes, such as: Quitting smoking. Maintaining a healthy weight. Staying active. Try to get 150 minutes of moderate-intensity exercise every week. Ask your health care provider which activities are safe for you. Eating a healthy diet. Avoid high-fat foods, like fried foods. Avoid refined carbohydrates like white bread and white rice. Limit how much alcohol and caffeine you drink. Increase your fiber intake. Healthy sources of fiber include beans, whole grains, and fresh fruits and vegetables. Behavioral changes, such as: Pelvic floor muscle exercises. Bladder training, such as lengthening the amount of time between bathroom breaks, or using the bathroom at regular intervals. Using techniques to suppress bladder urges. This can include distraction techniques or controlled breathing exercises. Medicines, such as: Medicines to relax the  bladder   muscles and prevent bladder spasms. Medicines to help slow or prevent the growth of a man's prostate. Botox injections. These can help relax the bladder muscles. Treatments, such as: Using pulses of electricity to help change bladder reflexes (electrical nerve stimulation). For women, using a medical device to prevent urine leaks. This is a small, tampon-like, disposable device that is inserted into the urethra. Injecting collagen or carbon beads (bulking agents) into the urinary sphincter. These can help thicken tissue and close the bladder opening. Surgery. Follow these instructions at home: Lifestyle Limit alcohol and caffeine. These can fill your bladder quickly and irritate it. Keep yourself clean to help prevent odors and skin damage. Ask your health care provider about special skin creams and cleansers that can protect the skin from urine. Consider wearing pads or adult diapers. Make sure to change them regularly, and always change them right after experiencing incontinence. General instructions Take over-the-counter and prescription medicines only as told by your health care provider. Use the bathroom about every 3-4 hours, even if you do not feel the need to urinate. Try to empty your bladder completely every time. After urinating, wait a minute. Then try to urinate again. Make sure you are in a relaxed position while urinating. If your incontinence is caused by nerve problems, keep a log of the medicines you take and the times you go to the bathroom. Keep all follow-up visits. This is important. Where to find more information National Institute of Diabetes and Digestive and Kidney Diseases: www.niddk.nih.gov American U[employer] Association: www.urologyhealth.org Contact a health care provider if: You have pain that gets worse. Your incontinence gets worse. Get help right away if: You have a fever or chills. You are unable to urinate. You have redness in your groin area or  down your legs. Summary Urinary incontinence refers to a condition in which a person is unable to control where and when to pass urine. This condition may be caused by medicines, infection, weak bladder muscles, weak pelvic floor muscles, enlargement of the prostate (in men), or surgery. Factors such as older age, obesity, pregnancy and childbirth, menopause, neurological diseases, and chronic coughing may increase your risk for developing this condition. Types of urinary incontinence include urge incontinence, stress incontinence, overflow incontinence, and functional incontinence. This condition is usually treated first with lifestyle and behavioral changes, such as quitting smoking, eating a healthier diet, and doing regular pelvic floor exercises. Other treatment options include medicines, bulking agents, medical devices, electrical nerve stimulation, or surgery. This information is not intended to replace advice given to you by your health care provider. Make sure you discuss any questions you have with your health care provider. Document Revised: 10/17/2019 Document Reviewed: 10/17/2019 Elsevier Patient Education  2023 [employer]  

## 2021-08-01 ENCOUNTER — Other Ambulatory Visit: Payer: Self-pay | Admitting: Family

## 2021-08-01 ENCOUNTER — Ambulatory Visit (INDEPENDENT_AMBULATORY_CARE_PROVIDER_SITE_OTHER): Payer: Medicare PPO | Admitting: Physician Assistant

## 2021-08-01 ENCOUNTER — Encounter: Payer: Self-pay | Admitting: Physician Assistant

## 2021-08-01 VITALS — BP 160/78 | HR 76 | Ht 62.0 in | Wt 199.0 lb

## 2021-08-01 DIAGNOSIS — K5900 Constipation, unspecified: Secondary | ICD-10-CM

## 2021-08-01 DIAGNOSIS — N3946 Mixed incontinence: Secondary | ICD-10-CM | POA: Diagnosis not present

## 2021-08-01 DIAGNOSIS — N3281 Overactive bladder: Secondary | ICD-10-CM | POA: Diagnosis not present

## 2021-08-01 DIAGNOSIS — M159 Polyosteoarthritis, unspecified: Secondary | ICD-10-CM

## 2021-08-01 LAB — URINALYSIS, ROUTINE W REFLEX MICROSCOPIC
Bilirubin, UA: NEGATIVE
Glucose, UA: NEGATIVE
Leukocytes,UA: NEGATIVE
Nitrite, UA: NEGATIVE
RBC, UA: NEGATIVE
Specific Gravity, UA: 1.025 (ref 1.005–1.030)
Urobilinogen, Ur: 1 mg/dL (ref 0.2–1.0)
pH, UA: 6 (ref 5.0–7.5)

## 2021-08-01 LAB — BLADDER SCAN AMB NON-IMAGING: Scan Result: 0

## 2021-08-01 MED ORDER — MIRABEGRON ER 50 MG PO TB24: 50.0000 mg | ORAL_TABLET | Freq: Every day | ORAL | 0 refills | Status: DC

## 2021-08-01 NOTE — Progress Notes (Signed)
? ?08/01/2021 ?11:32 AM  ? ?Sabrina Stein ?[birth year] ?[id number] ? ? ?Assessment: ? ?1. Mixed stress and urge urinary incontinence ?- Urinalysis, Routine w reflex microscopic ?- BLADDER SCAN AMB NON-IMAGING ? ?2. OAB (overactive bladder) ? ?3. Constipation, unspecified constipation type ? ? ?Plan: ?Discussed OAB, incontinence, and constipation at length with the patient.  She is given written and verbal instructions for treatment of all.  She will discontinue Detrol LA and begin Myrbetriq 50 mg daily.  Samples given.  Side effects discussed at length including blood pressure elevations.  She will inform us if she has any concerns with adverse side effects.  MiraLAX and Colace for chronic constipation as discussed.  Timed voiding discussed.  She will follow-up in 4 to 6 weeks for UA and PVR, sooner if she has concerns ? ?Chief Complaint: Urinary incontinence ? ?Referring provider: [name], FNP ?[address] ?[place],  [place] [address] ? ? ?History of Present Illness: ? ?Sabrina Stein is a 71 y.o. year old female who is seen in consultation from [name], FNP ? for evaluation of mixed urinary incontinence.  Symptoms have been ongoing for approximately 2 years and she admits to significant urgency, occasional hesitancy of stream, occasional incomplete emptying, and nocturia of 2-3 voids per night.  Detrol LA Rx on 07/19/21 and pt states it helped a little at first, but not really helping now. Wears one pad during the day and one at night.  She denies frequency, burning, hematuria, weakness of stream or history of UTIs or urinary stone disease.  No pain with intercourse.  Her history is significant for chronic and ongoing constipation.  Surgical history significant for cesarean x2 and appendectomy.  She has had no hormone replacement in many years. ? ?Urine clear ?PVR equals 0 mL ? ?Portions of the above documentation were copied from a prior visit for review purposes only ? ?Past Medical History:   ?Past Medical History:  ?Diagnosis Date  ? Depression   ? Hyperlipidemia   ? ? ?Past Surgical History:  ?Past Surgical History:  ?Procedure Laterality Date  ? APPENDECTOMY    ? CESAREAN SECTION    ? x2  ? ? ?Allergies:  ?Allergies  ?Allergen Reactions  ? Codeine Nausea And Vomiting  ? ? ?Family History:  ?Family History  ?Problem Relation Age of Onset  ? Cancer Father   ?     lung  ? Healthy Sister   ? Healthy Daughter   ? Healthy Son   ? ? ?Social History:  ?Social History  ? ?Tobacco Use  ? Smoking status: Former  ?  Packs/day: 1.00  ?  Years: 43.00  ?  Pack years: 43.00  ?  Types: Cigarettes  ?  Quit date: 01/26/2012  ?  Years since quitting: 9.5  ? Smokeless tobacco: Never  ?Vaping Use  ? Vaping Use: Never used  ?Substance Use Topics  ? Alcohol use: Yes  ?  Comment: occassional-glass of wine once a month  ? Drug use: No  ? ? ?Review of symptoms:  ?Constitutional:  Negative for unexplained weight loss, night sweats, fever, chills ?ENT:  Negative for nose bleeds, sinus pain, painful swallowing ?CV:  Negative for chest pain, shortness of breath, exercise intolerance, palpitations, loss of consciousness ?Resp:  Negative for cough, wheezing, shortness of breath ?GI:  Negative for nausea, vomiting, diarrhea, bloody stools ?GU:  Positives noted in HPI ?Neuro:  Negative for seizures, poor balance, limb weakness, slurred speech ?Endocrine:  Negative for polydipsia,  polyuria, symptoms of hypoglycemia (dizziness, hunger, sweating) ?Hematologic:  Negative for anemia, purpura, petechia, prolonged or excessive bleeding, use of anticoagulants ? ?Physical Exam: ?BP (!) 160/78   Pulse 76   Ht 5\' 2"  (1.575 m)   Wt 199 lb (90.3 kg)   BMI 36.40 kg/m?   ?Constitutional:  Alert and oriented, No acute distress. ?HEENT: NCAT, moist mucus membranes.  Trachea midline, no masses. ?Cardiovascular: Regular rate and rhythm without murmur, rub, or gallops ?No clubbing, cyanosis, or edema. ?Respiratory: Normal respiratory effort, clear to  auscultation bilaterally ?GI: Abdomen is soft, nontender, nondistended, no abdominal masses ?BACK:  Non-tender to palpation.  No CVAT ?Lymph: No cervical or inguinal lymphadenopathy. ?Skin: No obvious rashes, warm, dry, intact ?Neurologic: Alert and oriented, Cranial nerves grossly intact, no focal deficits, moving all 4 extremities. ?Psychiatric: Appropriate. Normal mood and affect. ? ?Laboratory Data: ?No results found for this or any previous visit (from the past 24 hour(s)). ? ?Lab Results  ?Component Value Date  ? WBC 8.1 12/31/2020  ? HGB 12.5 12/31/2020  ? HCT 37.3 12/31/2020  ? MCV 93 12/31/2020  ? PLT 422 12/31/2020  ? ? ?Lab Results  ?Component Value Date  ? CREATININE 0.91 12/31/2020  ? ?Lab Results  ?Component Value Date  ? HGBA1C 5.8 (H) 12/31/2020  ? ? ?Urinalysis ?No results found for: COLORURINE, APPEARANCEUR, LABSPEC, PHURINE, GLUCOSEU, HGBUR, BILIRUBINUR, KETONESUR, PROTEINUR, UROBILINOGEN, NITRITE, LEUKOCYTESUR ? ?Lab Results  ?Component Value Date  ? LABMICR 5.8 12/31/2020  ? ? ?Pertinent Imaging: ?No results found for this or any previous visit. ? ?No results found for this or any previous visit. ? ? ? ? ?[place], 03/02/2021, PA-C ?[facility] [place] ?  ?

## 2021-08-01 NOTE — Progress Notes (Signed)
post void residual =0mL 

## 2021-08-01 NOTE — Patient Instructions (Signed)
Miralax daily as needed for constipation ?Docusate daily as needed for hard stools/constipation ?

## 2021-08-29 ENCOUNTER — Other Ambulatory Visit: Payer: Self-pay | Admitting: Family

## 2021-08-29 DIAGNOSIS — M159 Polyosteoarthritis, unspecified: Secondary | ICD-10-CM

## 2021-09-14 ENCOUNTER — Ambulatory Visit: Payer: Medicare PPO | Admitting: Physician Assistant

## 2021-09-14 VITALS — BP 144/76 | HR 82 | Ht 62.0 in | Wt 198.0 lb

## 2021-09-14 DIAGNOSIS — N3281 Overactive bladder: Secondary | ICD-10-CM

## 2021-09-14 DIAGNOSIS — N3946 Mixed incontinence: Secondary | ICD-10-CM

## 2021-09-14 LAB — URINALYSIS, ROUTINE W REFLEX MICROSCOPIC
Bilirubin, UA: NEGATIVE
Glucose, UA: NEGATIVE
Leukocytes,UA: NEGATIVE
Nitrite, UA: NEGATIVE
RBC, UA: NEGATIVE
Specific Gravity, UA: 1.025 (ref 1.005–1.030)
Urobilinogen, Ur: 1 mg/dL (ref 0.2–1.0)
pH, UA: 5.5 (ref 5.0–7.5)

## 2021-09-14 LAB — BLADDER SCAN AMB NON-IMAGING: Scan Result: 0

## 2021-09-14 MED ORDER — MIRABEGRON ER 50 MG PO TB24: 50.0000 mg | ORAL_TABLET | Freq: Every day | ORAL | 11 refills | Status: DC

## 2021-09-14 NOTE — Progress Notes (Signed)
post void residual =0mL 

## 2021-09-14 NOTE — Progress Notes (Signed)
Assessment: 1. OAB (overactive bladder) - Urinalysis, Routine w reflex microscopic - BLADDER SCAN AMB NON-IMAGING    Plan: Continue Myrbetriq 50mg  daily. Timed voiding. If she has concerns or new c/o, she will FU sooner and will otherwise FU in 1 year for UA and PVR for refill of Myrbetriq.  Chief Complaint: No chief complaint on file.   HPI: Sabrina Stein is a 71 y.o. female who presents for continued evaluation of OAB, incontinence and constipation. She is doing much better with her OAB sxs and only has occasional leakage if the urge is severe and if she is awakened at night with the urge. No LUTS. No hematuria. Tolerating Miralax  UA clear PVR=0.  08/01/21 Sabrina Stein is a 71 y.o. 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  Symptoms have been ongoing for approximately 2 years and she admits to significant urgency, occasional hesitancy of stream, occasional incomplete emptying, and nocturia of 2-3 voids per night.  Detrol LA Rx on 07/19/21 and pt states it helped a little at first, but not really helping now. Wears one pad during the day and one at night.  She denies frequency, burning, hematuria, weakness of stream or history of UTIs or urinary stone disease.  No pain with intercourse.  Her history is significant for chronic and ongoing constipation.  Surgical history significant for cesarean x2 and appendectomy.  She has had no hormone replacement in many years.   Urine clear PVR equals 0 mL Portions of the above documentation were copied from a prior visit for review purposes only.  Allergies: Allergies  Allergen Reactions   Codeine Nausea And Vomiting    PMH: Past Medical History:  Diagnosis Date   Depression    Hyperlipidemia     PSH: Past Surgical History:  Procedure Laterality Date   APPENDECTOMY     CESAREAN SECTION     x2    SH: Social History   Tobacco Use   Smoking status:  Former    Packs/day: 1.00    Years: 43.00    Total pack years: 43.00    Types: Cigarettes    Quit date: 01/26/2012    Years since quitting: 9.6   Smokeless tobacco: Never  Vaping Use   Vaping Use: Never used  Substance Use Topics   Alcohol use: Yes    Comment: occassional-glass of wine once a month   Drug use: No    ROS: All other review of systems were reviewed and are negative except what is noted above in HPI  PE: BP (!) 144/76   Pulse 82   Ht 5\' 2"  (1.575 m)   Wt 198 lb (89.8 kg)   BMI 36.21 kg/m  GENERAL APPEARANCE:  Well appearing, well developed, well nourished, NAD HEENT:  Atraumatic, normocephalic NECK:  Supple. Trachea midline ABDOMEN:  Non-distended EXTREMITIES:  Moves all extremities well, without clubbing, cyanosis, or edema NEUROLOGIC:  Alert and oriented x 3, normal gait, CN II-XII grossly intact MENTAL STATUS:  appropriate SKIN:  Warm, dry, and intact   Results: Laboratory Data: Lab Results  Component Value Date   WBC 8.1 12/31/2020   HGB 12.5 12/31/2020   HCT 37.3 12/31/2020   MCV 93 12/31/2020   PLT 422 12/31/2020    Lab Results  Component Value Date   CREATININE 0.91 12/31/2020     Lab Results  Component Value Date   HGBA1C 5.8 (H) 12/31/2020  Urinalysis    Component Value Date/Time   APPEARANCEUR Clear 08/01/2021 1152   GLUCOSEU Negative 08/01/2021 1152   BILIRUBINUR Negative 08/01/2021 1152   PROTEINUR Trace (A) 08/01/2021 1152   NITRITE Negative 08/01/2021 1152   LEUKOCYTESUR Negative 08/01/2021 1152    Lab Results  Component Value Date   LABMICR Comment 08/01/2021    Pertinent Imaging: No results found for this or any previous visit.  No results found for this or any previous visit.  No results found for this or any previous visit.  No results found for this or any previous visit.  No results found for this or any previous visit.  No results found for this or any previous visit.  No results found for this  or any previous visit.  No results found for this or any previous visit.  Results for orders placed or performed in visit on 09/14/21 (from the past 24 hour(s))  BLADDER SCAN AMB NON-IMAGING   Collection Time: 09/14/21  9:30 AM  Result Value Ref Range   Scan Result 0

## 2021-09-30 ENCOUNTER — Other Ambulatory Visit: Payer: Self-pay | Admitting: Family

## 2021-09-30 DIAGNOSIS — E782 Mixed hyperlipidemia: Secondary | ICD-10-CM

## 2021-10-17 ENCOUNTER — Other Ambulatory Visit: Payer: Self-pay | Admitting: Family

## 2021-10-17 DIAGNOSIS — F330 Major depressive disorder, recurrent, mild: Secondary | ICD-10-CM

## 2021-10-17 DIAGNOSIS — F411 Generalized anxiety disorder: Secondary | ICD-10-CM

## 2021-10-17 DIAGNOSIS — N3281 Overactive bladder: Secondary | ICD-10-CM

## 2021-10-29 ENCOUNTER — Other Ambulatory Visit: Payer: Self-pay | Admitting: Family

## 2021-10-29 DIAGNOSIS — M159 Polyosteoarthritis, unspecified: Secondary | ICD-10-CM

## 2021-11-30 ENCOUNTER — Other Ambulatory Visit: Payer: Self-pay | Admitting: Family

## 2021-11-30 DIAGNOSIS — M159 Polyosteoarthritis, unspecified: Secondary | ICD-10-CM

## 2021-12-28 ENCOUNTER — Other Ambulatory Visit: Payer: Self-pay | Admitting: Family

## 2022-01-04 ENCOUNTER — Other Ambulatory Visit: Payer: Self-pay | Admitting: Family

## 2022-01-04 DIAGNOSIS — M159 Polyosteoarthritis, unspecified: Secondary | ICD-10-CM

## 2022-01-15 ENCOUNTER — Other Ambulatory Visit: Payer: Self-pay | Admitting: Family

## 2022-01-15 DIAGNOSIS — F411 Generalized anxiety disorder: Secondary | ICD-10-CM

## 2022-01-15 DIAGNOSIS — F330 Major depressive disorder, recurrent, mild: Secondary | ICD-10-CM

## 2022-01-19 ENCOUNTER — Other Ambulatory Visit: Payer: Self-pay | Admitting: Family

## 2022-01-24 ENCOUNTER — Encounter: Payer: Medicare PPO | Admitting: Family

## 2022-01-27 NOTE — Patient Instructions (Incomplete)
Plantar Fasciitis  Plantar fasciitis is a painful foot condition that affects the heel. It occurs when the band of tissue that connects the toes to the heel bone (plantar fascia) becomes irritated. This can happen as the result of exercising too much or doing other repetitive activities (overuse injury). Plantar fasciitis can cause mild irritation to severe pain that makes it difficult to walk or move. The pain is usually worse in the morning after sleeping, or after sitting or lying down for a period of time. Pain may also be worse after long periods of walking or standing. What are the causes? This condition may be caused by: Standing for long periods of time. Wearing shoes that do not have good arch support. Doing activities that put stress on joints (high-impact activities). This includes ballet and exercise that makes your heart beat faster (aerobic exercise), such as running. Being overweight. An abnormal way of walking (gait). Tight muscles in the back of your lower leg (calf). High arches in your feet or flat feet. Starting a new athletic activity. What are the signs or symptoms? The main symptom of this condition is heel pain. Pain may get worse after the following: Taking the first steps after a time of rest, especially in the morning after awakening, or after you have been sitting or lying down for a while. Long periods of standing still. Pain may decrease after 30-45 minutes of activity, such as gentle walking. How is this diagnosed? This condition may be diagnosed based on your medical history, a physical exam, and your symptoms. Your health care provider will check for: A tender area on the bottom of your foot. A high arch in your foot or flat feet. Pain when you move your foot. Difficulty moving your foot. You may have imaging tests to confirm the diagnosis, such as: X-rays. Ultrasound. MRI. How is this treated? Treatment for plantar fasciitis depends on how severe your  condition is. Treatment may include: Rest, ice, pressure (compression), and raising (elevating) the affected foot. This is called RICE therapy. Your health care provider may recommend RICE therapy along with over-the-counter pain medicines to manage your pain. Exercises to stretch your calves and your plantar fascia. A splint that holds your foot in a stretched, upward position while you sleep (night splint). Physical therapy to relieve symptoms and prevent problems in the future. Injections of steroid medicine (cortisone) to relieve pain and inflammation. Stimulating your plantar fascia with electrical impulses (extracorporeal shock wave therapy). This is usually the last treatment option before surgery. Surgery, if other treatments have not worked after 12 months. Follow these instructions at home: Managing pain, stiffness, and swelling  If directed, put ice on the painful area. To do this: Put ice in a plastic bag, or use a frozen bottle of water. Place a towel between your skin and the bag or bottle. Roll the bottom of your foot over the bag or bottle. Do this for 20 minutes, 2-3 times a day. Wear athletic shoes that have air-sole or gel-sole cushions, or try soft shoe inserts that are designed for plantar fasciitis. Elevate your foot above the level of your heart while you are sitting or lying down. Activity Avoid activities that cause pain. Ask your health care provider what activities are safe for you. Do physical therapy exercises and stretches as told by your health care provider. Try activities and forms of exercise that are easier on your joints (low impact). Examples include swimming, water aerobics, and biking. General instructions Take over-the-counter   and prescription medicines only as told by your health care provider. Wear a night splint while sleeping, if told by your health care provider. Loosen the splint if your toes tingle, become numb, or turn cold and blue. Maintain a  healthy weight, or work with your health care provider to lose weight as needed. Keep all follow-up visits. This is important. Contact a health care provider if you have: Symptoms that do not go away with home treatment. Pain that gets worse. Pain that affects your ability to move or do daily activities. Summary Plantar fasciitis is a painful foot condition that affects the heel. It occurs when the band of tissue that connects the toes to the heel bone (plantar fascia) becomes irritated. Heel pain is the main symptom of this condition. It may get worse after exercising too much or standing still for a long time. Treatment varies, but it usually starts with rest, ice, pressure (compression), and raising (elevating) the affected foot. This is called RICE therapy. Over-the-counter medicines can also be used to manage pain. This information is not intended to replace advice given to you by your health care provider. Make sure you discuss any questions you have with your health care provider. Document Revised: 06/30/2019 Document Reviewed: 06/30/2019 Elsevier Patient Education  [facility] records indicate that you are due for your annual mammogram/breast imaging. While there is no way to prevent breast cancer, early detection provides the best opportunity for curing it. For women over the age of 37, the [place] recommends a yearly clinical breast exam and a yearly mammogram. These practices have saved thousands of lives. We need your help to ensure that you are receiving optimal medical care. Please call the imaging location that has done you previous mammograms. Please remember to list [place] as your primary care. This helps make sure we receive a report and can update your chart.  Below is the contact information for several local breast imaging centers. You may call the location that works best for you, and they will be happy to assistance in making you an appointment. You do  not need an order for a regular screening mammogram. However, if you are having any problems or concerns with you breast area, please let your primary care provider know, and appropriate orders will be placed. Please let our office know if you have any questions or concerns. Or if you need information for another imaging center not on this list or outside of the area. We are commented to working with you on your health care journey.   The mobile unit/bus ([facility]) - they come twice a month to our location.  These appointments can be made through our office or by call [facility] [place] [facility] [place]  [place], [place] [address] Phone [phone]  [facility] Radiology Department  [address] [place], [place] [address] [phone]  [facility] ([facility] of [place])  [phone] [address] [place], [place] [address] [phone]  [place] [address] [place] [place] [address] [phone]  [place]  [address], [place] [address] [phone]  [facility] in [place], [place] [address] [phone]  [facility] [place], [place]  [id number] [phone]  [facility] at [facility]  [place], [place] [address] [phone]  [place] [address] [place] [address] [phone]

## 2022-01-30 ENCOUNTER — Encounter: Payer: Self-pay | Admitting: Family

## 2022-01-30 ENCOUNTER — Ambulatory Visit (INDEPENDENT_AMBULATORY_CARE_PROVIDER_SITE_OTHER): Payer: Medicare PPO | Admitting: Family

## 2022-01-30 VITALS — BP 135/72 | HR 64 | Temp 97.8°F | Ht 62.0 in | Wt 204.0 lb

## 2022-01-30 DIAGNOSIS — N3281 Overactive bladder: Secondary | ICD-10-CM | POA: Diagnosis not present

## 2022-01-30 DIAGNOSIS — E782 Mixed hyperlipidemia: Secondary | ICD-10-CM

## 2022-01-30 DIAGNOSIS — M159 Polyosteoarthritis, unspecified: Secondary | ICD-10-CM

## 2022-01-30 DIAGNOSIS — K219 Gastro-esophageal reflux disease without esophagitis: Secondary | ICD-10-CM

## 2022-01-30 DIAGNOSIS — Z0001 Encounter for general adult medical examination with abnormal findings: Secondary | ICD-10-CM

## 2022-01-30 DIAGNOSIS — Z23 Encounter for immunization: Secondary | ICD-10-CM

## 2022-01-30 DIAGNOSIS — Z79899 Other long term (current) drug therapy: Secondary | ICD-10-CM | POA: Diagnosis not present

## 2022-01-30 DIAGNOSIS — M722 Plantar fascial fibromatosis: Secondary | ICD-10-CM

## 2022-01-30 DIAGNOSIS — F330 Major depressive disorder, recurrent, mild: Secondary | ICD-10-CM

## 2022-01-30 DIAGNOSIS — R7303 Prediabetes: Secondary | ICD-10-CM | POA: Diagnosis not present

## 2022-01-30 DIAGNOSIS — Z0000 Encounter for general adult medical examination without abnormal findings: Secondary | ICD-10-CM

## 2022-01-30 DIAGNOSIS — F5101 Primary insomnia: Secondary | ICD-10-CM | POA: Diagnosis not present

## 2022-01-30 DIAGNOSIS — F411 Generalized anxiety disorder: Secondary | ICD-10-CM | POA: Diagnosis not present

## 2022-01-30 MED ORDER — PREDNISONE 10 MG (21) PO TBPK: ORAL_TABLET | ORAL | 0 refills | Status: DC

## 2022-01-30 MED ORDER — ALPRAZOLAM 0.25 MG PO TABS: 0.2500 mg | ORAL_TABLET | Freq: Every evening | ORAL | 1 refills | Status: DC | PRN

## 2022-01-30 MED ORDER — DICLOFENAC SODIUM 75 MG PO TBEC: 75.0000 mg | DELAYED_RELEASE_TABLET | Freq: Two times a day (BID) | ORAL | 2 refills | Status: DC

## 2022-01-30 MED ORDER — ATORVASTATIN CALCIUM 20 MG PO TABS: 20.0000 mg | ORAL_TABLET | Freq: Every day | ORAL | 1 refills | Status: DC

## 2022-01-30 MED ORDER — VENLAFAXINE HCL ER 75 MG PO CP24: 75.0000 mg | ORAL_CAPSULE | Freq: Every day | ORAL | 3 refills | Status: DC

## 2022-01-30 NOTE — Progress Notes (Signed)
Subjective:    Patient ID: Sabrina Stein, female    DOB: 1950-03-29, 71 y.o.   MRN: [id number]  Chief Complaint  Patient presents with   Annual Exam    Right foot pain. Flu shot today.   Weight Loss   PT presents to the office today for CPE without pap. She is currently taking CBD gummies for sleep and states she still having a hard time falling asleep. She is prediabetic for the last 10 years. Doing well with this.    She is morbid obese with a BMI of 36 and hyperlipidemia and depression.   She is complaining of right arch pain that started last week. Reports an aching pain of 5 out 10. She has taken tylenol with mild relief.  Gastroesophageal Reflux She complains of belching and heartburn. She reports no nausea. This is a chronic problem. The current episode started more than 1 year ago. The problem occurs occasionally. The problem has been waxing and waning. The symptoms are aggravated by certain foods. Associated symptoms include fatigue. She has tried a PPI for the symptoms. The treatment provided moderate relief.  Arthritis Presents for follow-up visit. She complains of pain and stiffness. The symptoms have been stable. Affected locations include the left MCP, right MCP and neck. Her pain is at a severity of 2/10. Associated symptoms include fatigue.  Urinary Frequency  This is a chronic problem. The current episode started more than 1 year ago. The problem occurs intermittently. The problem has been gradually improving. The pain is at a severity of 0/10. The patient is experiencing no pain. Associated symptoms include frequency and urgency. Pertinent negatives include no hematuria, hesitancy, nausea or vomiting. Treatments tried: [place].  Insomnia Primary symptoms: difficulty falling asleep, frequent awakening, no premature morning awakening.   The current episode started more than one year. The problem occurs intermittently. Past treatments include meditation. The treatment  provided mild relief. PMH includes: depression.   Hyperlipidemia This is a chronic problem. The current episode started more than 1 year ago. The problem is controlled. Recent lipid tests were reviewed and are normal. Exacerbating diseases include obesity. Current antihyperlipidemic treatment includes statins. The current treatment provides moderate improvement of lipids. Risk factors for coronary artery disease include dyslipidemia, hypertension and a sedentary lifestyle.  Depression        This is a chronic problem.  The current episode started more than 1 year ago.   The onset quality is gradual.   The problem occurs intermittently.  Associated symptoms include fatigue, insomnia and restlessness.  Associated symptoms include no helplessness, no hopelessness and not sad.  Past treatments include SNRIs - Serotonin and norepinephrine reuptake inhibitors.  Past medical history includes anxiety.   Anxiety Presents for follow-up visit. Symptoms include excessive worry, insomnia, irritability and restlessness. Patient reports no nausea.        Review of Systems  Constitutional:  Positive for fatigue and irritability.  Gastrointestinal:  Positive for heartburn. Negative for nausea and vomiting.  Genitourinary:  Positive for frequency and urgency. Negative for hematuria and hesitancy.  Musculoskeletal:  Positive for arthritis and stiffness.  Psychiatric/Behavioral:  Positive for depression. The patient has insomnia.   All other systems reviewed and are negative.  Family History  Problem Relation Age of Onset   Cancer Father        lung   Healthy Sister    Healthy Daughter    Healthy Son    Social History   Socioeconomic History   Marital status:  Married    Spouse name: Herbie Baltimore   Number of children: 2   Years of education: 18   Highest education level: Master's degree (e.g., MA, MS, MEng, MEd, MSW, MBA)  Occupational History   Occupation: Retired   Tobacco Use   Smoking status: Former     Packs/day: 1.00    Years: 43.00    Total pack years: 43.00    Types: Cigarettes    Quit date: 01/26/2012    Years since quitting: 10.0   Smokeless tobacco: Never  Vaping Use   Vaping Use: Never used  Substance and Sexual Activity   Alcohol use: Yes    Comment: occassional-glass of wine once a month   Drug use: No   Sexual activity: Yes  Other Topics Concern   Not on file  Social History Narrative   Not on file   Social Determinants of Health   Financial Resource Strain: Low Risk  (02/12/2020)   Overall Financial Resource Strain (CARDIA)    Difficulty of Paying Living Expenses: Not hard at all  Food Insecurity: No Food Insecurity (02/12/2020)   Hunger Vital Sign    Worried About Running Out of Food in the Last Year: Never true    Ran Out of Food in the Last Year: Never true  Transportation Needs: No Transportation Needs (02/12/2020)   PRAPARE - Hydrologist (Medical): No    Lack of Transportation (Non-Medical): No  Physical Activity: Insufficiently Active (02/12/2020)   Exercise Vital Sign    Days of Exercise per Week: 3 days    Minutes of Exercise per Session: 30 min  Stress: No Stress Concern Present (02/12/2020)   [place]    Feeling of Stress : Only a little  Social Connections: Socially Integrated (02/12/2020)   Social Connection and Isolation Panel [NHANES]    Frequency of Communication with Friends and Family: More than three times a week    Frequency of Social Gatherings with Friends and Family: More than three times a week    Attends Religious Services: More than 4 times per year    Active Member of [employer] or Organizations: Yes    Attends Archivist Meetings: 1 to 4 times per year    Marital Status: Married       Objective:   Physical Exam Vitals reviewed.  Constitutional:      General: She is not in acute distress.    Appearance: She is  well-developed. She is obese.  HENT:     Head: Normocephalic and atraumatic.     Right Ear: Tympanic membrane normal.     Left Ear: Tympanic membrane normal.  Eyes:     Pupils: Pupils are equal, round, and reactive to light.  Neck:     Thyroid: No thyromegaly.  Cardiovascular:     Rate and Rhythm: Normal rate and regular rhythm.     Heart sounds: Normal heart sounds. No murmur heard. Pulmonary:     Effort: Pulmonary effort is normal. No respiratory distress.     Breath sounds: Normal breath sounds. No wheezing.  Abdominal:     General: Bowel sounds are normal. There is no distension.     Palpations: Abdomen is soft.     Tenderness: There is no abdominal tenderness.  Musculoskeletal:        General: No tenderness. Normal range of motion.     Cervical back: Normal range of motion and neck supple.  Skin:    General: Skin is warm and dry.  Neurological:     Mental Status: She is alert and oriented to person, place, and time.     Cranial Nerves: No cranial nerve deficit.     Deep Tendon Reflexes: Reflexes are normal and symmetric.  Psychiatric:        Behavior: Behavior normal.        Thought Content: Thought content normal.        Judgment: Judgment normal.    Diabetic Foot Exam - Simple   Simple Foot Form Diabetic Foot exam was performed with the following findings: Yes 01/30/2022  3:57 PM  Visual Inspection No deformities, no ulcerations, no other skin breakdown bilaterally: Yes Sensation Testing Intact to touch and monofilament testing bilaterally: Yes Pulse Check Posterior Tibialis and Dorsalis pulse intact bilaterally: Yes Comments       BP 135/72   Pulse 64   Temp 97.8 F (36.6 C) (Temporal)   Ht _0  (1.575 m)   Wt 204 lb (92.5 kg)   BMI 37.31 kg/m      Assessment & Plan:  Sabrina Stein comes in today with chief complaint of Annual Exam (Right foot pain. Flu shot today.) and Weight Loss   Diagnosis and orders addressed:  1. Annual physical exam -  CMP14+EGFR - CBC with Differential/Platelet - Lipid panel - TSH  2. Morbid obesity (Standard) - CMP14+EGFR - CBC with Differential/Platelet  3. Primary insomnia  - CMP14+EGFR - CBC with Differential/Platelet  4. Mixed hyperlipidemia - CMP14+EGFR - CBC with Differential/Platelet - Lipid panel - atorvastatin (LIPITOR) 20 MG tablet; Take 1 tablet (20 mg total) by mouth daily.  Dispense: 90 tablet; Refill: 1  5. Mild episode of recurrent major depressive disorder (HCC) - CMP14+EGFR - CBC with Differential/Platelet - venlafaxine XR (EFFEXOR-XR) 75 MG 24 hr capsule; Take 1 capsule (75 mg total) by mouth daily.  Dispense: 90 capsule; Refill: 3  6. Overactive bladder - CMP14+EGFR - CBC with Differential/Platelet  7. Controlled substance agreement signed - CMP14+EGFR - CBC with Differential/Platelet - ALPRAZolam (XANAX) 0.25 MG tablet; Take 1 tablet (0.25 mg total) by mouth at bedtime as needed for anxiety.  Dispense: 20 tablet; Refill: 1 - ToxASSURE Select 13 (MW), Urine  8. Gastroesophageal reflux disease without esophagitis - CMP14+EGFR - CBC with Differential/Platelet  9. Prediabetes - CMP14+EGFR - CBC with Differential/Platelet - Bayer DCA Hb A1c Waived - Microalbumin / creatinine urine ratio  10. Osteoarthritis of multiple joints, unspecified osteoarthritis type  - CMP14+EGFR - CBC with Differential/Platelet - diclofenac (VOLTAREN) 75 MG EC tablet; Take 1 tablet (75 mg total) by mouth 2 (two) times daily.  Dispense: 180 tablet; Refill: 2  11. GAD (generalized anxiety disorder) - CMP14+EGFR - CBC with Differential/Platelet - ALPRAZolam (XANAX) 0.25 MG tablet; Take 1 tablet (0.25 mg total) by mouth at bedtime as needed for anxiety.  Dispense: 20 tablet; Refill: 1 - venlafaxine XR (EFFEXOR-XR) 75 MG 24 hr capsule; Take 1 capsule (75 mg total) by mouth daily.  Dispense: 90 capsule; Refill: 3 - ToxASSURE Select 13 (MW), Urine  12. Plantar fasciitis Continue diclofenac  BID, no other NSAID's - predniSONE (STERAPRED UNI-PAK 21 TAB) 10 MG (21) TBPK tablet; Use as directed  Dispense: 21 tablet; Refill: 0    Labs pending Patient reviewed in [place] controlled database, no flags noted. Contract and drug screen are up to date.  Health Maintenance reviewed Diet and exercise encouraged  Follow up plan: 6 months    [facility]  Lenna Gilford, [place]

## 2022-01-30 NOTE — Addendum Note (Signed)
Addended by: Brynda Peon F on: 01/30/2022 04:13 PM   Modules accepted: Orders

## 2022-01-31 LAB — MICROALBUMIN / CREATININE URINE RATIO
Creatinine, Urine: 152.6 mg/dL
Microalb/Creat Ratio: 4 mg/g creat (ref 0–29)
Microalbumin, Urine: 6.2 ug/mL

## 2022-01-31 LAB — BAYER DCA HB A1C WAIVED: HB A1C (BAYER DCA - WAIVED): 5.8 % — ABNORMAL HIGH (ref 4.8–5.6)

## 2022-02-02 LAB — CMP14+EGFR
ALT: 16 IU/L (ref 0–32)
AST: 13 IU/L (ref 0–40)
Albumin/Globulin Ratio: 2 (ref 1.2–2.2)
Albumin: 4.1 g/dL (ref 3.8–4.8)
Alkaline Phosphatase: 130 IU/L — ABNORMAL HIGH (ref 44–121)
BUN/Creatinine Ratio: 15 (ref 12–28)
BUN: 17 mg/dL (ref 8–27)
Bilirubin Total: 0.3 mg/dL (ref 0.0–1.2)
CO2: 20 mmol/L (ref 20–29)
Calcium: 9.2 mg/dL (ref 8.7–10.3)
Chloride: 104 mmol/L (ref 96–106)
Creatinine, Ser: 1.13 mg/dL — ABNORMAL HIGH (ref 0.57–1.00)
Globulin, Total: 2.1 g/dL (ref 1.5–4.5)
Glucose: 87 mg/dL (ref 70–99)
Potassium: 4.3 mmol/L (ref 3.5–5.2)
Sodium: 141 mmol/L (ref 134–144)
Total Protein: 6.2 g/dL (ref 6.0–8.5)
eGFR: 52 mL/min/{1.73_m2} — ABNORMAL LOW (ref >59)

## 2022-02-02 LAB — CBC WITH DIFFERENTIAL/PLATELET
Basophils Absolute: 0.1 10*3/uL (ref 0.0–0.2)
Basos: 1 %
EOS (ABSOLUTE): 0.2 10*3/uL (ref 0.0–0.4)
Eos: 2 %
Hematocrit: 35.5 % (ref 34.0–46.6)
Hemoglobin: 12.1 g/dL (ref 11.1–15.9)
Immature Grans (Abs): 0 10*3/uL (ref 0.0–0.1)
Immature Granulocytes: 0 %
Lymphocytes Absolute: 1.9 10*3/uL (ref 0.7–3.1)
Lymphs: 26 %
MCH: 32.5 pg (ref 26.6–33.0)
MCHC: 34.1 g/dL (ref 31.5–35.7)
MCV: 95 fL (ref 79–97)
Monocytes Absolute: 0.6 10*3/uL (ref 0.1–0.9)
Monocytes: 9 %
Neutrophils Absolute: 4.4 10*3/uL (ref 1.4–7.0)
Neutrophils: 62 %
Platelets: 326 10*3/uL (ref 150–450)
RBC: 3.72 x10E6/uL — ABNORMAL LOW (ref 3.77–5.28)
RDW: 13.1 % (ref 11.7–15.4)
WBC: 7.2 10*3/uL (ref 3.4–10.8)

## 2022-02-02 LAB — LIPID PANEL
Chol/HDL Ratio: 2.8 ratio (ref 0.0–4.4)
Cholesterol, Total: 171 mg/dL (ref 100–199)
HDL: 62 mg/dL (ref >39)
LDL Chol Calc (NIH): 67 mg/dL (ref 0–99)
Triglycerides: 266 mg/dL — ABNORMAL HIGH (ref 0–149)
VLDL Cholesterol Cal: 42 mg/dL — ABNORMAL HIGH (ref 5–40)

## 2022-02-02 LAB — TSH: TSH: 2.87 u[IU]/mL (ref 0.450–4.500)

## 2022-02-03 LAB — TOXASSURE SELECT 13 (MW), URINE

## 2022-02-26 ENCOUNTER — Other Ambulatory Visit: Payer: Self-pay | Admitting: Family

## 2022-02-26 DIAGNOSIS — N3281 Overactive bladder: Secondary | ICD-10-CM

## 2022-03-29 ENCOUNTER — Other Ambulatory Visit: Payer: Self-pay | Admitting: Family

## 2022-03-29 DIAGNOSIS — N3281 Overactive bladder: Secondary | ICD-10-CM

## 2022-04-30 ENCOUNTER — Other Ambulatory Visit: Payer: Self-pay | Admitting: Family

## 2022-05-31 ENCOUNTER — Other Ambulatory Visit: Payer: Self-pay | Admitting: Family

## 2022-05-31 DIAGNOSIS — N3281 Overactive bladder: Secondary | ICD-10-CM

## 2022-07-31 ENCOUNTER — Encounter: Payer: Self-pay | Admitting: Family

## 2022-07-31 ENCOUNTER — Ambulatory Visit: Payer: Medicare PPO | Admitting: Family

## 2022-07-31 VITALS — BP 128/80 | HR 66 | Temp 97.1°F | Ht 62.0 in | Wt 202.0 lb

## 2022-07-31 DIAGNOSIS — Z122 Encounter for screening for malignant neoplasm of respiratory organs: Secondary | ICD-10-CM

## 2022-07-31 DIAGNOSIS — F411 Generalized anxiety disorder: Secondary | ICD-10-CM

## 2022-07-31 DIAGNOSIS — N3946 Mixed incontinence: Secondary | ICD-10-CM | POA: Diagnosis not present

## 2022-07-31 DIAGNOSIS — Z79899 Other long term (current) drug therapy: Secondary | ICD-10-CM | POA: Diagnosis not present

## 2022-07-31 DIAGNOSIS — F330 Major depressive disorder, recurrent, mild: Secondary | ICD-10-CM | POA: Diagnosis not present

## 2022-07-31 DIAGNOSIS — E782 Mixed hyperlipidemia: Secondary | ICD-10-CM | POA: Diagnosis not present

## 2022-07-31 DIAGNOSIS — Z87891 Personal history of nicotine dependence: Secondary | ICD-10-CM

## 2022-07-31 DIAGNOSIS — N3281 Overactive bladder: Secondary | ICD-10-CM

## 2022-07-31 DIAGNOSIS — K219 Gastro-esophageal reflux disease without esophagitis: Secondary | ICD-10-CM

## 2022-07-31 DIAGNOSIS — R7401 Elevation of levels of liver transaminase levels: Secondary | ICD-10-CM | POA: Diagnosis not present

## 2022-07-31 DIAGNOSIS — M159 Polyosteoarthritis, unspecified: Secondary | ICD-10-CM

## 2022-07-31 DIAGNOSIS — R7303 Prediabetes: Secondary | ICD-10-CM

## 2022-07-31 DIAGNOSIS — F5101 Primary insomnia: Secondary | ICD-10-CM

## 2022-07-31 LAB — BAYER DCA HB A1C WAIVED: HB A1C (BAYER DCA - WAIVED): 5.7 % — ABNORMAL HIGH (ref 4.8–5.6)

## 2022-07-31 MED ORDER — ATORVASTATIN CALCIUM 20 MG PO TABS: 20.0000 mg | ORAL_TABLET | Freq: Every day | ORAL | 1 refills | Status: DC

## 2022-07-31 MED ORDER — VENLAFAXINE HCL ER 75 MG PO CP24
75.0000 mg | ORAL_CAPSULE | Freq: Every day | ORAL | 3 refills | Status: DC
Start: 2022-07-31 — End: 2023-07-14

## 2022-07-31 MED ORDER — DICLOFENAC SODIUM 75 MG PO TBEC: 75.0000 mg | DELAYED_RELEASE_TABLET | Freq: Two times a day (BID) | ORAL | 2 refills | Status: DC

## 2022-07-31 MED ORDER — MIRABEGRON ER 50 MG PO TB24: 50.0000 mg | ORAL_TABLET | Freq: Every day | ORAL | 11 refills | Status: DC

## 2022-07-31 MED ORDER — ALPRAZOLAM 0.25 MG PO TABS: 0.2500 mg | ORAL_TABLET | Freq: Every evening | ORAL | 1 refills | Status: DC | PRN

## 2022-07-31 NOTE — Progress Notes (Signed)
Subjective:    Patient ID: Sabrina Stein, female    DOB: 20-Feb-1951, 72 y.o.   MRN: [id number]  Chief Complaint  Patient presents with   Medical Management of Chronic Issues   PT presents to the office today for chronic follow up. She is currently taking CBD gummies for sleep and states she still having a hard time falling asleep. She is prediabetic for the last 10 years. Doing well with this.    She has OAB and taking Myrbetriq without relief. Followed by Urologists annually.   She is morbid obese with a BMI of 36 and hyperlipidemia and depression.   Reports she smoked for 50+ years, and quit smoking 2014.  Gastroesophageal Reflux She complains of belching and heartburn. This is a chronic problem. The current episode started more than 1 year ago. The problem occurs occasionally. Associated symptoms include fatigue. Risk factors include obesity. She has tried a PPI for the symptoms. The treatment provided moderate relief.  Arthritis Presents for follow-up visit. She complains of pain and stiffness. Affected locations include the neck. Her pain is at a severity of 2/10. Associated symptoms include fatigue.  Urinary Frequency  This is a chronic problem. The current episode started more than 1 year ago. The problem occurs intermittently. The problem has been waxing and waning. The patient is experiencing no pain. Associated symptoms include frequency. Treatments tried: myrbetriq.  Insomnia Primary symptoms: difficulty falling asleep, frequent awakening.   The current episode started more than one year. PMH includes: depression.   Hyperlipidemia This is a chronic problem. The current episode started more than 1 year ago. The problem is controlled. Exacerbating diseases include obesity. Current antihyperlipidemic treatment includes statins. The current treatment provides moderate improvement of lipids. Risk factors for coronary artery disease include dyslipidemia, hypertension and a sedentary  lifestyle.  Depression        This is a chronic problem.  The current episode started more than 1 year ago.   Associated symptoms include fatigue, helplessness, hopelessness, insomnia and sad.  Past treatments include SNRIs - Serotonin and norepinephrine reuptake inhibitors.  Past medical history includes anxiety.   Anxiety Presents for follow-up visit. Symptoms include excessive worry, insomnia, irritability and nervous/anxious behavior. Symptoms occur occasionally. The severity of symptoms is moderate.        Review of Systems  Constitutional:  Positive for fatigue and irritability.  Gastrointestinal:  Positive for heartburn.  Genitourinary:  Positive for frequency.  Musculoskeletal:  Positive for arthritis and stiffness.  Psychiatric/Behavioral:  Positive for depression. The patient is nervous/anxious and has insomnia.   All other systems reviewed and are negative.      Objective:   Physical Exam Vitals reviewed.  Constitutional:      General: She is not in acute distress.    Appearance: She is well-developed. She is obese.  HENT:     Head: Normocephalic and atraumatic.     Right Ear: Tympanic membrane normal.     Left Ear: Tympanic membrane normal.  Eyes:     Pupils: Pupils are equal, round, and reactive to light.  Neck:     Thyroid: No thyromegaly.  Cardiovascular:     Rate and Rhythm: Normal rate and regular rhythm.     Heart sounds: Normal heart sounds. No murmur heard. Pulmonary:     Effort: Pulmonary effort is normal. No respiratory distress.     Breath sounds: Normal breath sounds. No wheezing.  Abdominal:     General: Bowel sounds are normal. There is  no distension.     Palpations: Abdomen is soft.     Tenderness: There is no abdominal tenderness.  Musculoskeletal:        General: No tenderness. Normal range of motion.     Cervical back: Normal range of motion and neck supple.  Skin:    General: Skin is warm and dry.  Neurological:     Mental Status: She  is alert and oriented to person, place, and time.     Cranial Nerves: No cranial nerve deficit.     Deep Tendon Reflexes: Reflexes are normal and symmetric.  Psychiatric:        Behavior: Behavior normal.        Thought Content: Thought content normal.        Judgment: Judgment normal.       BP 128/80   Pulse 66   Temp (!) 97.1 F (36.2 C) (Temporal)   Ht 5\' 2"  (1.575 m)   Wt 202 lb (91.6 kg)   SpO2 95%   BMI 36.95 kg/m      Assessment & Plan:   Sabrina Stein comes in today with chief complaint of Medical Management of Chronic Issues   Diagnosis and orders addressed:  1. GAD (generalized anxiety disorder) - ALPRAZolam (XANAX) 0.25 MG tablet; Take 1 tablet (0.25 mg total) by mouth at bedtime as needed for anxiety.  Dispense: 20 tablet; Refill: 1 - venlafaxine XR (EFFEXOR-XR) 75 MG 24 hr capsule; Take 1 capsule (75 mg total) by mouth daily.  Dispense: 90 capsule; Refill: 3 - CMP14+EGFR  2. Controlled substance agreement signed - ALPRAZolam (XANAX) 0.25 MG tablet; Take 1 tablet (0.25 mg total) by mouth at bedtime as needed for anxiety.  Dispense: 20 tablet; Refill: 1 - CMP14+EGFR  3. Mixed hyperlipidemia - atorvastatin (LIPITOR) 20 MG tablet; Take 1 tablet (20 mg total) by mouth daily.  Dispense: 90 tablet; Refill: 1 - CMP14+EGFR  4. Osteoarthritis of multiple joints, unspecified osteoarthritis type - diclofenac (VOLTAREN) 75 MG EC tablet; Take 1 tablet (75 mg total) by mouth 2 (two) times daily.  Dispense: 180 tablet; Refill: 2 - CMP14+EGFR  5. Mild episode of recurrent major depressive disorder (HCC) - venlafaxine XR (EFFEXOR-XR) 75 MG 24 hr capsule; Take 1 capsule (75 mg total) by mouth daily.  Dispense: 90 capsule; Refill: 3 - CMP14+EGFR  6. Gastroesophageal reflux disease without esophagitis - CMP14+EGFR  7. Mixed stress and urge urinary incontinence - CMP14+EGFR  8. Morbid obesity (HCC) - CMP14+EGFR  9. Overactive bladder - CMP14+EGFR  10.  Prediabetes - Bayer DCA Hb A1c Waived - CMP14+EGFR  11. Primary insomnia - CMP14+EGFR  12. Screening for lung cancer - CMP14+EGFR - Ambulatory Referral Lung Cancer Screening [facility]  13. Former smoker - CMP14+EGFR - Ambulatory Referral Lung Cancer Screening [facility]   Labs pending Patient reviewed in [place] controlled database, no flags noted. Contract and drug screen are up to date.  Health Maintenance reviewed Diet and exercise encouraged  Follow up plan: 6 months    Jannifer Rodney, FNP

## 2022-07-31 NOTE — Patient Instructions (Signed)

## 2022-08-01 LAB — CMP14+EGFR
ALT: 24 IU/L (ref 0–32)
AST: 23 IU/L (ref 0–40)
Albumin/Globulin Ratio: 1.6 (ref 1.2–2.2)
Albumin: 3.9 g/dL (ref 3.8–4.8)
Alkaline Phosphatase: 153 IU/L — ABNORMAL HIGH (ref 44–121)
BUN/Creatinine Ratio: 15 (ref 12–28)
BUN: 18 mg/dL (ref 8–27)
Bilirubin Total: 0.3 mg/dL (ref 0.0–1.2)
CO2: 21 mmol/L (ref 20–29)
Calcium: 9.3 mg/dL (ref 8.7–10.3)
Chloride: 104 mmol/L (ref 96–106)
Creatinine, Ser: 1.22 mg/dL — ABNORMAL HIGH (ref 0.57–1.00)
Globulin, Total: 2.4 g/dL (ref 1.5–4.5)
Glucose: 100 mg/dL — ABNORMAL HIGH (ref 70–99)
Potassium: 5.8 mmol/L — ABNORMAL HIGH (ref 3.5–5.2)
Sodium: 140 mmol/L (ref 134–144)
Total Protein: 6.3 g/dL (ref 6.0–8.5)
eGFR: 47 mL/min/{1.73_m2} — ABNORMAL LOW (ref >59)

## 2022-08-03 ENCOUNTER — Other Ambulatory Visit: Payer: Self-pay | Admitting: Family

## 2022-08-03 ENCOUNTER — Encounter: Payer: Self-pay | Admitting: Family

## 2022-08-03 DIAGNOSIS — R748 Abnormal levels of other serum enzymes: Secondary | ICD-10-CM

## 2022-08-03 LAB — GAMMA GT: GGT: 66 IU/L — ABNORMAL HIGH (ref 0–60)

## 2022-08-03 LAB — SPECIMEN STATUS REPORT

## 2022-08-03 NOTE — Progress Notes (Signed)
Patient r/ c °

## 2022-08-17 ENCOUNTER — Ambulatory Visit (HOSPITAL_COMMUNITY)
Admission: RE | Admit: 2022-08-17 | Discharge: 2022-08-17 | Disposition: A | Discharge status: Home / Self Care | Payer: Medicare PPO | Source: Ambulatory Visit | Attending: Family | Admitting: Family

## 2022-08-17 DIAGNOSIS — R748 Abnormal levels of other serum enzymes: Secondary | ICD-10-CM | POA: Diagnosis not present

## 2022-08-17 DIAGNOSIS — R945 Abnormal results of liver function studies: Secondary | ICD-10-CM | POA: Diagnosis not present

## 2022-08-18 NOTE — Progress Notes (Signed)
Patient r/ c °

## 2022-09-09 ENCOUNTER — Other Ambulatory Visit: Payer: Self-pay | Admitting: Family

## 2022-09-11 ENCOUNTER — Ambulatory Visit: Payer: Medicare PPO | Admitting: Urology

## 2022-09-11 ENCOUNTER — Ambulatory Visit: Payer: Medicare PPO | Admitting: Physician Assistant

## 2022-09-25 ENCOUNTER — Ambulatory Visit: Payer: Medicare PPO | Admitting: Urology

## 2022-09-25 VITALS — BP 136/77 | HR 88

## 2022-09-25 DIAGNOSIS — N3946 Mixed incontinence: Secondary | ICD-10-CM

## 2022-09-25 DIAGNOSIS — N3281 Overactive bladder: Secondary | ICD-10-CM | POA: Diagnosis not present

## 2022-09-25 LAB — URINALYSIS, ROUTINE W REFLEX MICROSCOPIC
Bilirubin, UA: NEGATIVE
Glucose, UA: NEGATIVE
Leukocytes,UA: NEGATIVE
Nitrite, UA: NEGATIVE
RBC, UA: NEGATIVE
Specific Gravity, UA: 1.03 (ref 1.005–1.030)
Urobilinogen, Ur: 1 mg/dL (ref 0.2–1.0)
pH, UA: 5.5 (ref 5.0–7.5)

## 2022-09-25 MED ORDER — TOLTERODINE TARTRATE ER 4 MG PO CP24: 4.0000 mg | ORAL_CAPSULE | Freq: Every day | ORAL | 3 refills | Status: DC

## 2022-09-25 NOTE — Progress Notes (Signed)
09/25/2022 2:43 PM   Sabrina Stein Jun 16, 1950 [id number]  Referring provider: [name], FNP [address] [place],  [place] [address]  No chief complaint on file.   HPI: F/u - new pt for me -   1) OAB, incontinence - since 2021. Significant urgency, occasional hesitancy of stream, occasional incomplete emptying, and nocturia of 2-3 voids per night.  Detrol LA Rx on 07/19/21 and pt states it helped a little at first. Wears one pad during the day and one at night.  No pain with intercourse. Surgical history significant for cesarean x2 and appendectomy.  She has had no hormone replacement in many years. PVR was 0. No NG risk.   She stopped Detrol and started [place] in 2023. She takes Miralax for constipation. Leakage improved. PVR was 0.   Today, seen for the above. Continues myrbetriq 50 mg. Leakage has worsened. Gets a sudden urge and will wet a pad. Using 2 ppd. Sometimes 2 ppd at night. Foot on the floor and turn key. She does leak with sneeze. No constipation. She drinks mainly water.     PMH: Past Medical History:  Diagnosis Date   Depression    Hyperlipidemia     Surgical History: Past Surgical History:  Procedure Laterality Date   APPENDECTOMY     CESAREAN SECTION     x2    Home Medications:  Allergies as of 09/25/2022       Reactions   Codeine Nausea And Vomiting        Medication List        Accurate as of September 25, 2022  2:43 PM. If you have any questions, ask your nurse or doctor.          ALPRAZolam 0.25 MG tablet Commonly known as: XANAX Take 1 tablet (0.25 mg total) by mouth at bedtime as needed for anxiety.   atorvastatin 20 MG tablet Commonly known as: LIPITOR Take 1 tablet (20 mg total) by mouth daily.   CALCIUM 1200 PO Take by mouth.   diclofenac 75 MG EC tablet Commonly known as: VOLTAREN Take 1 tablet (75 mg total) by mouth 2 (two) times daily.   mirabegron ER 50 MG Tb24 tablet Commonly known as: MYRBETRIQ Take 1  tablet (50 mg total) by mouth daily.   omeprazole 40 MG capsule Commonly known as: PRILOSEC TAKE 1 CAPSULE (40 MG TOTAL) BY MOUTH DAILY.   venlafaxine XR 75 MG 24 hr capsule Commonly known as: EFFEXOR-XR Take 1 capsule (75 mg total) by mouth daily.   vitamin C 1000 MG tablet Take 1,000 mg by mouth daily.   Vitamin D3 1.25 MG (50000 UT) Caps Take by mouth.   zinc gluconate 50 MG tablet Take 50 mg by mouth daily.        Allergies:  Allergies  Allergen Reactions   Codeine Nausea And Vomiting    Family History: Family History  Problem Relation Age of Onset   Cancer Father        lung   Healthy Sister    Healthy Daughter    Healthy Son     Social History:  reports that she quit smoking about 10 years ago. Her smoking use included cigarettes. She has a 43.00 pack-year smoking history. She has never used smokeless tobacco. She reports current alcohol use. She reports that she does not use drugs.   Physical Exam: BP 136/77   Pulse 88   Constitutional:  Alert and oriented, No acute distress. HEENT: [place]  AT, moist mucus membranes.  Trachea midline, no masses. Cardiovascular: No clubbing, cyanosis, or edema. Respiratory: Normal respiratory effort, no increased work of breathing. GI: Abdomen is soft, nontender, nondistended, no abdominal masses GU: No CVA tenderness Skin: No rashes, bruises or suspicious lesions. Neurologic: Grossly intact, no focal deficits, moving all 4 extremities. Psychiatric: Normal mood and affect.  Laboratory Data: Lab Results  Component Value Date   WBC 7.2 01/30/2022   HGB 12.1 01/30/2022   HCT 35.5 01/30/2022   MCV 95 01/30/2022   PLT 326 01/30/2022    Lab Results  Component Value Date   CREATININE 1.22 (H) 07/31/2022    No results found for: "PSA"  No results found for: "TESTOSTERONE"  Lab Results  Component Value Date   HGBA1C 5.7 (H) 07/31/2022    Urinalysis    Component Value Date/Time   APPEARANCEUR Clear 09/14/2021  1321   GLUCOSEU Negative 09/14/2021 1321   BILIRUBINUR Negative 09/14/2021 1321   PROTEINUR Trace (A) 09/14/2021 1321   NITRITE Negative 09/14/2021 1321   LEUKOCYTESUR Negative 09/14/2021 1321    Lab Results  Component Value Date   LABMICR 6.2 01/30/2022      Assessment & Plan:    1. OAB (overactive bladder) On myrbetriq 50 mg. Will add tolterodine or vesicare.  - Urinalysis, Routine w reflex microscopic - BLADDER SCAN AMB NON-IMAGING  2. Mixed stress and urge urinary incontinence Also disc PT,  PTNS and botox.  - Urinalysis, Routine w reflex microscopic - BLADDER SCAN AMB NON-IMAGING   No follow-ups on file.  Jerilee Field, MD  [facility]  [address] [place], [place] [address] [phone]

## 2022-10-15 IMAGING — DX DG FOOT COMPLETE 3+V*L*
3 series · 3 of 3 positions shown · non-contrast
Comparison: None.

CLINICAL DATA: Pain posteriorly

EXAM:
LEFT FOOT - COMPLETE 3+ VIEW

[foot ap]
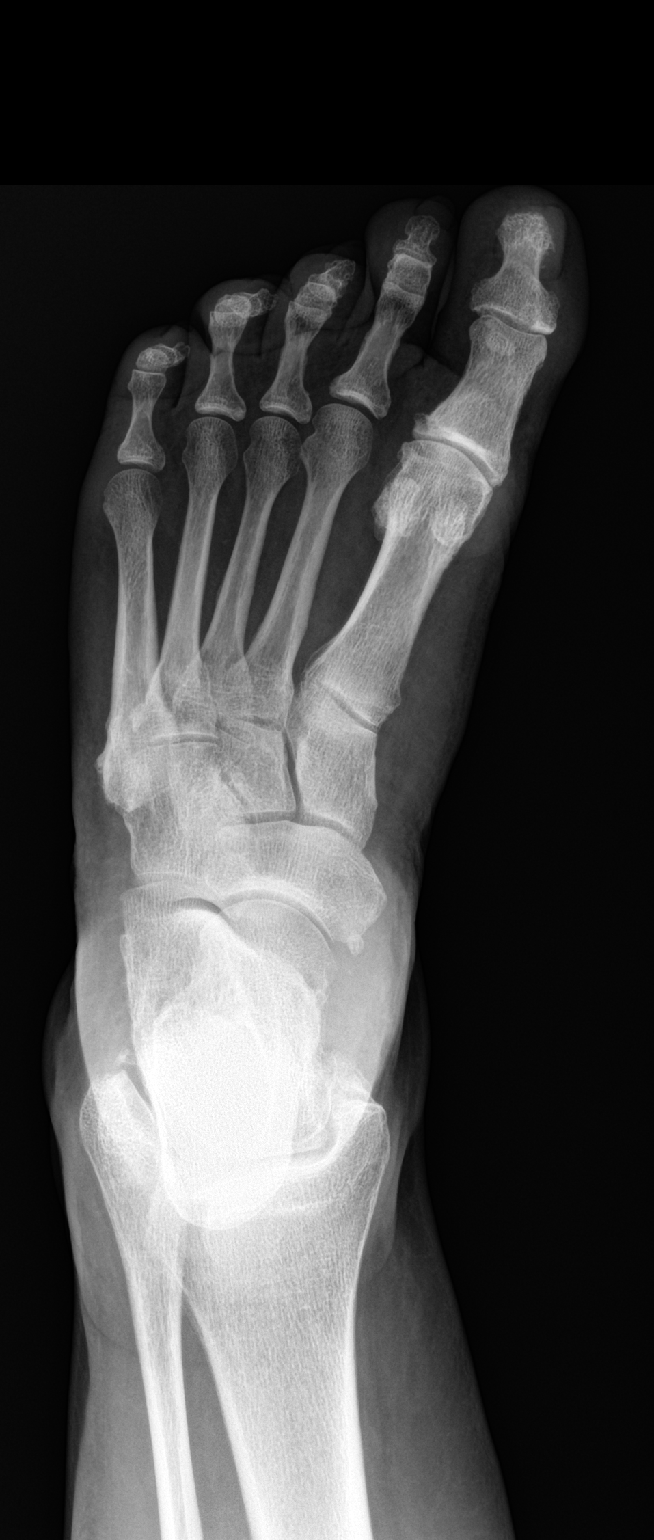

[foot obl]
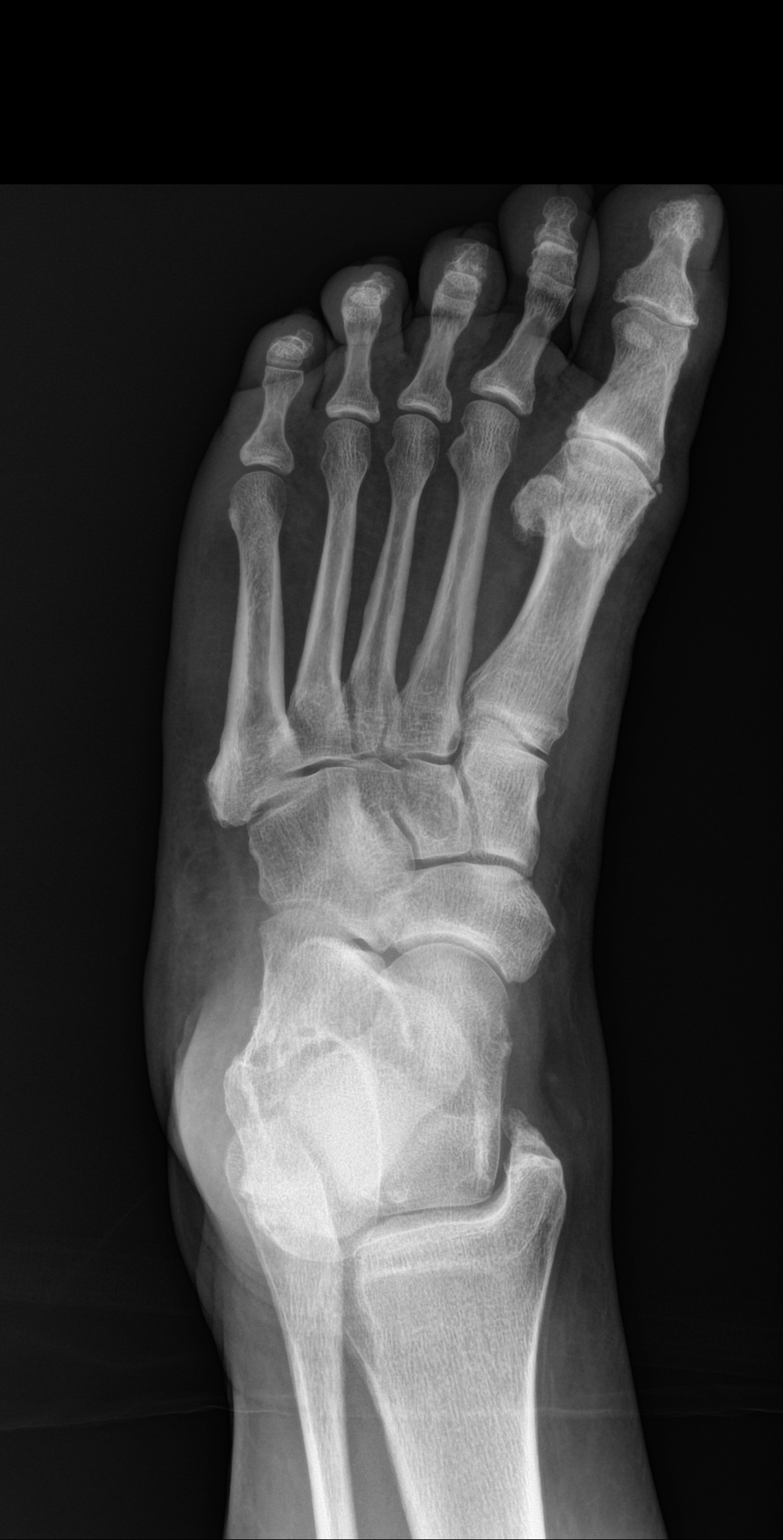

[foot lat]
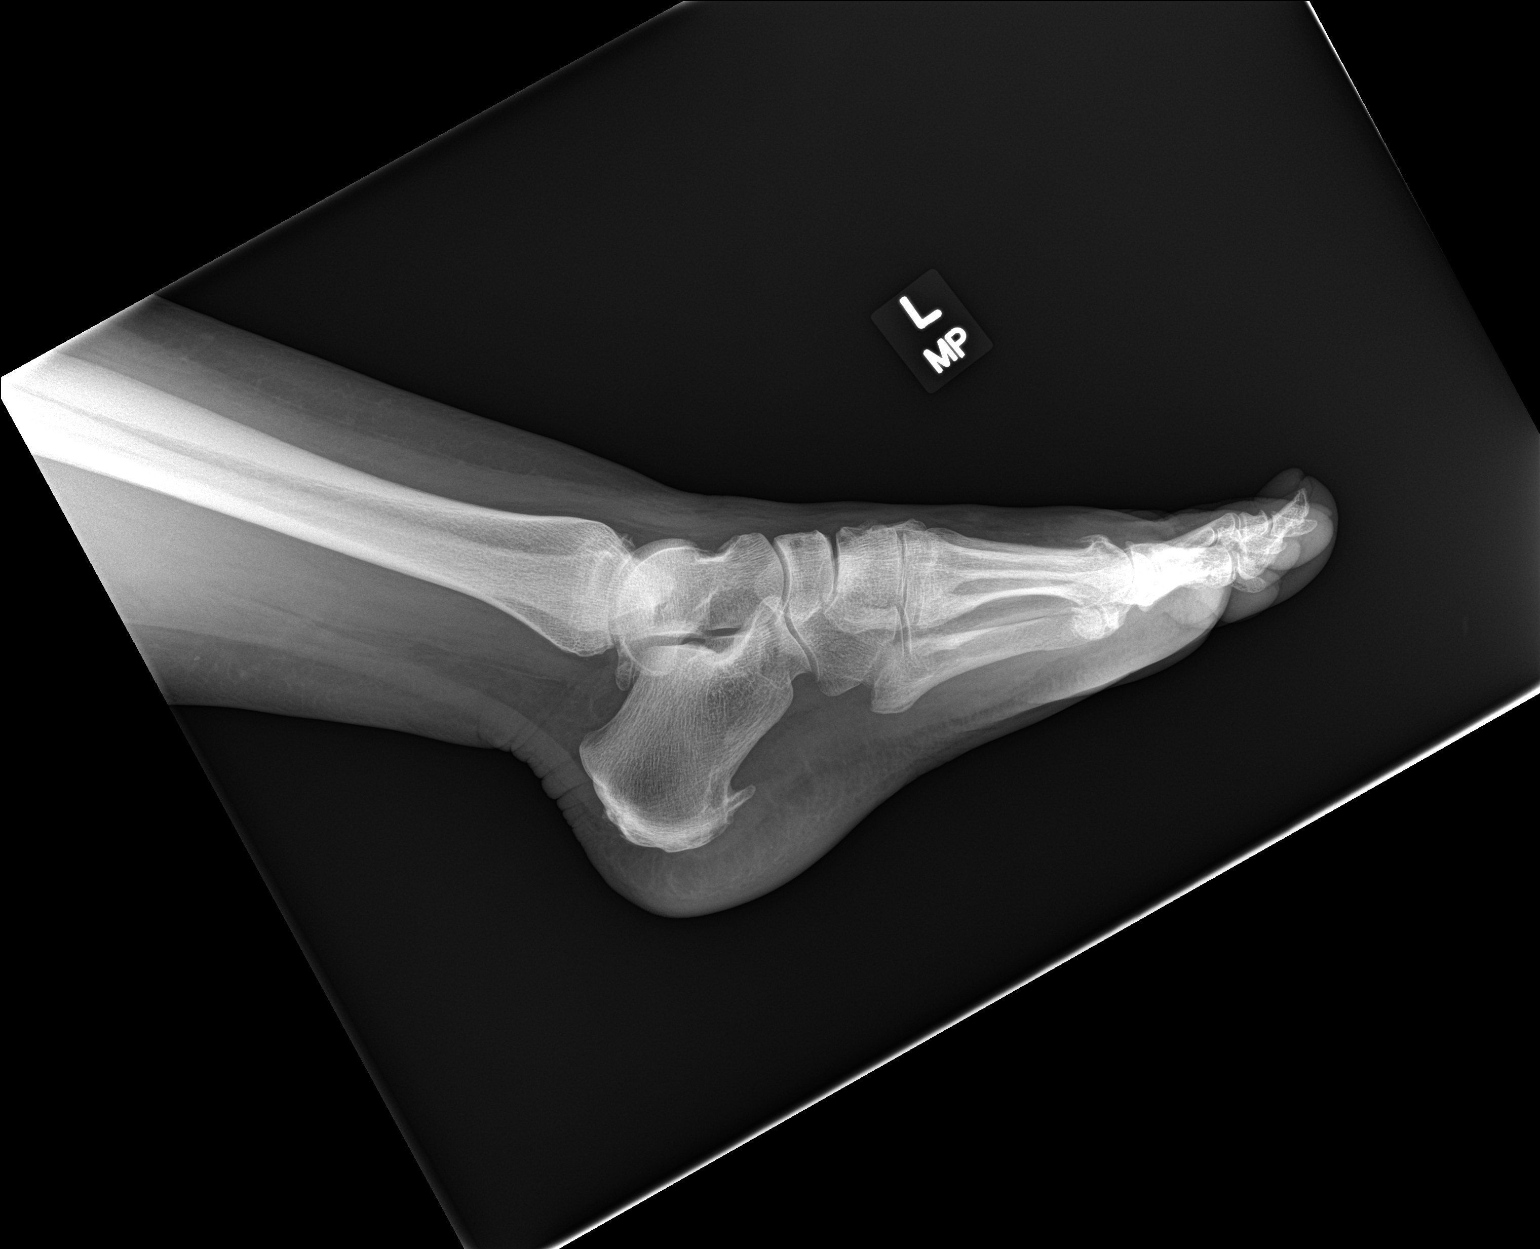

[3 of 3 positions shown; findings below may reference images not displayed]

FINDINGS: Frontal, oblique, and lateral views were obtained. No fracture or
dislocation. There is osteoarthritic change in first MTP and IP
joints. Other joint spaces appear normal. There is persistent
flexion of the PIP joints. There is a prominent inferior calcaneal
spur. No erosions.
IMPRESSION: No fracture or dislocation. Prominent inferior calcaneal spur.
Osteoarthritic change in the first MTP and IP joints.

## 2022-11-03 ENCOUNTER — Encounter: Payer: Self-pay | Admitting: Family

## 2022-11-03 ENCOUNTER — Ambulatory Visit: Payer: Medicare PPO | Admitting: Family

## 2022-11-03 VITALS — Temp 97.0°F | Ht 62.0 in | Wt 191.4 lb

## 2022-11-03 DIAGNOSIS — F5101 Primary insomnia: Secondary | ICD-10-CM

## 2022-11-03 DIAGNOSIS — Z6835 Body mass index (BMI) 35.0-35.9, adult: Secondary | ICD-10-CM

## 2022-11-03 DIAGNOSIS — F330 Major depressive disorder, recurrent, mild: Secondary | ICD-10-CM

## 2022-11-03 DIAGNOSIS — R7303 Prediabetes: Secondary | ICD-10-CM | POA: Diagnosis not present

## 2022-11-03 DIAGNOSIS — N3281 Overactive bladder: Secondary | ICD-10-CM | POA: Diagnosis not present

## 2022-11-03 DIAGNOSIS — K760 Fatty (change of) liver, not elsewhere classified: Secondary | ICD-10-CM

## 2022-11-03 DIAGNOSIS — N3946 Mixed incontinence: Secondary | ICD-10-CM | POA: Diagnosis not present

## 2022-11-03 DIAGNOSIS — F411 Generalized anxiety disorder: Secondary | ICD-10-CM | POA: Diagnosis not present

## 2022-11-03 DIAGNOSIS — Z79899 Other long term (current) drug therapy: Secondary | ICD-10-CM

## 2022-11-03 DIAGNOSIS — K219 Gastro-esophageal reflux disease without esophagitis: Secondary | ICD-10-CM | POA: Diagnosis not present

## 2022-11-03 DIAGNOSIS — E782 Mixed hyperlipidemia: Secondary | ICD-10-CM | POA: Diagnosis not present

## 2022-11-03 DIAGNOSIS — M159 Polyosteoarthritis, unspecified: Secondary | ICD-10-CM

## 2022-11-03 DIAGNOSIS — E66812 Obesity, class 2: Secondary | ICD-10-CM

## 2022-11-03 LAB — BMP8+EGFR

## 2022-11-03 LAB — HEPATIC FUNCTION PANEL

## 2022-11-03 LAB — CBC WITH DIFFERENTIAL/PLATELET
Basophils Absolute: 0.1 10*3/uL (ref 0.0–0.2)
Basos: 1 %
EOS (ABSOLUTE): 0.1 10*3/uL (ref 0.0–0.4)
Eos: 2 %
Hematocrit: 34.6 % (ref 34.0–46.6)
Hemoglobin: 11.8 g/dL (ref 11.1–15.9)
Immature Grans (Abs): 0 10*3/uL (ref 0.0–0.1)
Immature Granulocytes: 0 %
Lymphocytes Absolute: 1.4 10*3/uL (ref 0.7–3.1)
Lymphs: 27 %
MCH: 32.2 pg (ref 26.6–33.0)
MCHC: 34.1 g/dL (ref 31.5–35.7)
MCV: 94 fL (ref 79–97)
Monocytes Absolute: 0.4 10*3/uL (ref 0.1–0.9)
Monocytes: 8 %
Neutrophils Absolute: 3.2 10*3/uL (ref 1.4–7.0)
Neutrophils: 62 %
Platelets: 330 10*3/uL (ref 150–450)
RBC: 3.67 x10E6/uL — ABNORMAL LOW (ref 3.77–5.28)
RDW: 12.9 % (ref 11.7–15.4)
WBC: 5.3 10*3/uL (ref 3.4–10.8)

## 2022-11-03 MED ORDER — ALPRAZOLAM 0.25 MG PO TABS: 0.2500 mg | ORAL_TABLET | Freq: Every evening | ORAL | 1 refills | Status: DC | PRN

## 2022-11-03 NOTE — Progress Notes (Addendum)
Subjective:    Patient ID: Sabrina Stein, female    DOB: Nov 23, 1950, 72 y.o.   MRN: [id number]  Chief Complaint  Patient presents with   Medical Management of Chronic Issues     PT presents to the office today for chronic follow up. She is currently taking CBD gummies for sleep and states she still having a hard time falling asleep. She is prediabetic for the last 10 years. Doing well with this.    She has OAB and taking Myrbetriq 50 mg and detrol 4 mg with relief. Followed by Urologists annually.    She is morbid obese with a BMI of 35 and hyperlipidemia and depression.   She has fatty liver and has lost 11 lb since our last visit.   Reports she smoked for 50+ years, and quit smoking 2014. Gastroesophageal Reflux She complains of belching and heartburn. This is a chronic problem. The current episode started more than 1 year ago. The problem occurs occasionally. Pertinent negatives include no fatigue. Risk factors include obesity. She has tried a PPI for the symptoms. The treatment provided moderate relief.  Arthritis Presents for follow-up visit. She complains of pain and stiffness. Affected locations include the left MCP, right MCP and neck. Her pain is at a severity of 1/10. Pertinent negatives include no fatigue.  Urinary Frequency  This is a chronic problem. The current episode started more than 1 year ago. The patient is experiencing no pain. Associated symptoms include frequency. The treatment provided moderate relief.  Insomnia Primary symptoms: difficulty falling asleep, frequent awakening.   The current episode started more than one year. The onset quality is gradual. The problem occurs intermittently. Past treatments include medication. The treatment provided moderate relief. PMH includes: depression.   Hyperlipidemia This is a chronic problem. The current episode started more than 1 year ago. The problem is controlled. Recent lipid tests were reviewed and are normal.  Exacerbating diseases include obesity. Current antihyperlipidemic treatment includes statins. The current treatment provides moderate improvement of lipids. Risk factors for coronary artery disease include dyslipidemia, hypertension, a sedentary lifestyle and post-menopausal.  Depression        This is a chronic problem.  The current episode started more than 1 year ago.   The onset quality is sudden.   Associated symptoms include helplessness, hopelessness, insomnia and sad.  Associated symptoms include no fatigue.  Past treatments include SNRIs - Serotonin and norepinephrine reuptake inhibitors.  Past medical history includes anxiety.   Anxiety Presents for follow-up visit. Symptoms include excessive worry, insomnia and nervous/anxious behavior. Symptoms occur occasionally. The severity of symptoms is mild.    Diabetes She presents for her follow-up diabetic visit. Diabetes type: prediabetic. Hypoglycemia symptoms include nervousness/anxiousness. Pertinent negatives for diabetes include no blurred vision, no fatigue and no foot paresthesias. Pertinent negatives for diabetic complications include no retinopathy. Risk factors for coronary artery disease include dyslipidemia, hypertension, sedentary lifestyle and post-menopausal. (Does not check glucose at home)      Review of Systems  Constitutional:  Negative for fatigue.  Eyes:  Negative for blurred vision.  Gastrointestinal:  Positive for heartburn.  Genitourinary:  Positive for frequency.  Musculoskeletal:  Positive for arthritis and stiffness.  Psychiatric/Behavioral:  Positive for depression. The patient is nervous/anxious and has insomnia.   All other systems reviewed and are negative.      Objective:   Physical Exam Vitals reviewed.  Constitutional:      General: She is not in acute distress.    Appearance:  She is well-developed. She is obese.  HENT:     Head: Normocephalic and atraumatic.     Right Ear: Tympanic membrane  normal.     Left Ear: Tympanic membrane normal.  Eyes:     Pupils: Pupils are equal, round, and reactive to light.  Neck:     Thyroid: No thyromegaly.  Cardiovascular:     Rate and Rhythm: Normal rate and regular rhythm.     Heart sounds: Normal heart sounds. No murmur heard. Pulmonary:     Effort: Pulmonary effort is normal. No respiratory distress.     Breath sounds: Normal breath sounds. No wheezing.  Abdominal:     General: Bowel sounds are normal. There is no distension.     Palpations: Abdomen is soft.     Tenderness: There is no abdominal tenderness.  Musculoskeletal:        General: No tenderness. Normal range of motion.     Cervical back: Normal range of motion and neck supple.  Skin:    General: Skin is warm and dry.  Neurological:     Mental Status: She is alert and oriented to person, place, and time.     Cranial Nerves: No cranial nerve deficit.     Deep Tendon Reflexes: Reflexes are normal and symmetric.  Psychiatric:        Behavior: Behavior normal.        Thought Content: Thought content normal.        Judgment: Judgment normal.      Temp (!) 97 F (36.1 C) (Temporal)   Ht 5\' 2"  (1.575 m)   Wt 191 lb 6.4 oz (86.8 kg)   SpO2 96%   BMI 35.01 kg/m      Assessment & Plan:   Sabrina Stein comes in today with chief complaint of Medical Management of Chronic Issues   Diagnosis and orders addressed:  1. GAD (generalized anxiety disorder) - ALPRAZolam (XANAX) 0.25 MG tablet; Take 1 tablet (0.25 mg total) by mouth at bedtime as needed for anxiety.  Dispense: 20 tablet; Refill: 1 - CBC with Differential/Platelet - BMP8+EGFR  2. Controlled substance agreement signed - ALPRAZolam (XANAX) 0.25 MG tablet; Take 1 tablet (0.25 mg total) by mouth at bedtime as needed for anxiety.  Dispense: 20 tablet; Refill: 1 - CBC with Differential/Platelet - BMP8+EGFR  3. Gastroesophageal reflux disease without esophagitis - CBC with Differential/Platelet -  BMP8+EGFR  4. Mild episode of recurrent major depressive disorder (HCC) - CBC with Differential/Platelet - BMP8+EGFR  5. Mixed hyperlipidemia - CBC with Differential/Platelet - BMP8+EGFR  6. Mixed stress and urge urinary incontinence - CBC with Differential/Platelet - BMP8+EGFR  7. Morbid obesity (HCC) -Due to excess calories  - CBC with Differential/Platelet - BMP8+EGFR  8. Osteoarthritis of multiple joints, unspecified osteoarthritis type - CBC with Differential/Platelet - BMP8+EGFR  9. Overactive bladder - CBC with Differential/Platelet - BMP8+EGFR  10. Prediabetes - CBC with Differential/Platelet - BMP8+EGFR  11. Primary insomnia - CBC with Differential/Platelet - BMP8+EGFR  12. Hepatic steatosis - Hepatic function panel - BMP8+EGFR -limit alcohol and tylenol    Labs pending Patient reviewed in [place] controlled database, no flags noted. Contract and drug screen are up to date.  Low fat diet and continue to lose weight Health Maintenance reviewed Diet and exercise encouraged  Follow up plan: 3 months    Jannifer Rodney, FNP

## 2022-11-03 NOTE — Patient Instructions (Signed)
Fatty Liver Disease  The liver converts food into energy, removes toxic material from the blood, makes important proteins, and absorbs necessary vitamins from food. Fatty liver disease occurs when too much fat has built up in your liver cells. Fatty liver disease is also called hepatic steatosis. In many cases, fatty liver disease does not cause symptoms or problems. It is often diagnosed when tests are being done for other reasons. However, over time, fatty liver can cause inflammation that may lead to more serious liver problems, such as scarring of the liver (cirrhosis) and liver failure. Fatty liver is associated with insulin resistance, increased body fat, high blood pressure (hypertension), and high cholesterol. These are features of metabolic syndrome and increase your risk for stroke, diabetes, and heart disease. What are the causes? This condition may be caused by components of metabolic syndrome: Obesity. Insulin resistance. High cholesterol. Other causes: Alcohol abuse. Poor nutrition. Cushing syndrome. Pregnancy. Certain drugs. Poisons. Some viral infections. What increases the risk? You are more likely to develop this condition if you: Abuse alcohol. Are overweight. Have diabetes. Have hepatitis. Have a high triglyceride level. Are pregnant. What are the signs or symptoms? Fatty liver disease often does not cause symptoms. If symptoms do develop, they can include: Fatigue and weakness. Weight loss. Confusion. Nausea, vomiting, or abdominal pain. Yellowing of your skin and the white parts of your eyes (jaundice). Itchy skin. How is this diagnosed? This condition may be diagnosed by: A physical exam and your medical history. Blood tests. Imaging tests, such as an ultrasound, CT scan, or MRI. A liver biopsy. A small sample of liver tissue is removed using a needle. The sample is then looked at under a microscope. How is this treated? Fatty liver disease is often  caused by other health conditions. Treatment for fatty liver may involve medicines and lifestyle changes to manage conditions such as: Alcoholism. High cholesterol. Diabetes. Being overweight or obese. Follow these instructions at home:  Do not drink alcohol. If you have trouble quitting, ask your health care provider how to safely quit with the help of medicine or a supervised program. This is important to keep your condition from getting worse. Eat a healthy diet as told by your health care provider. Ask your health care provider about working with a dietitian to develop an eating plan. Exercise regularly. This can help you lose weight and control your cholesterol and diabetes. Talk to your health care provider about an exercise plan and which activities are best for you. Take over-the-counter and prescription medicines only as told by your health care provider. Keep all follow-up visits. This is important. Contact a health care provider if: You have trouble controlling your: Blood sugar. This is especially important if you have diabetes. Cholesterol. Drinking of alcohol. Get help right away if: You have abdominal pain. You have jaundice. You have nausea and are vomiting. You vomit blood or material that looks like coffee grounds. You have stools that are black, tar-like, or bloody. Summary Fatty liver disease develops when too much fat builds up in the cells of your liver. Fatty liver disease often causes no symptoms or problems. However, over time, fatty liver can cause inflammation that may lead to more serious liver problems, such as scarring of the liver (cirrhosis). You are more likely to develop this condition if you abuse alcohol, are pregnant, are overweight, have diabetes, have hepatitis, or have high triglyceride or cholesterol levels. Contact your health care provider if you have trouble controlling your blood   sugar, cholesterol, or drinking of alcohol. This information is  not intended to replace advice given to you by your health care provider. Make sure you discuss any questions you have with your health care provider. Document Revised: 12/25/2019 Document Reviewed: 12/25/2019 Elsevier Patient Education  2024 Elsevier Inc.  

## 2022-11-06 ENCOUNTER — Other Ambulatory Visit: Payer: Self-pay | Admitting: Family

## 2022-11-06 DIAGNOSIS — R7989 Other specified abnormal findings of blood chemistry: Secondary | ICD-10-CM

## 2022-11-08 ENCOUNTER — Other Ambulatory Visit: Payer: Medicare PPO

## 2022-11-08 DIAGNOSIS — R7989 Other specified abnormal findings of blood chemistry: Secondary | ICD-10-CM

## 2022-11-09 LAB — BMP8+EGFR
BUN/Creatinine Ratio: 16 (ref 12–28)
BUN: 23 mg/dL (ref 8–27)
CO2: 20 mmol/L (ref 20–29)
Calcium: 9.2 mg/dL (ref 8.7–10.3)
Chloride: 104 mmol/L (ref 96–106)
Creatinine, Ser: 1.44 mg/dL — ABNORMAL HIGH (ref 0.57–1.00)
Glucose: 101 mg/dL — ABNORMAL HIGH (ref 70–99)
Potassium: 4.6 mmol/L (ref 3.5–5.2)
Sodium: 138 mmol/L (ref 134–144)
eGFR: 39 mL/min/{1.73_m2} — ABNORMAL LOW (ref >59)

## 2022-11-22 ENCOUNTER — Other Ambulatory Visit: Payer: Self-pay | Admitting: Family

## 2022-12-25 ENCOUNTER — Ambulatory Visit: Payer: Medicare PPO | Admitting: Urology

## 2023-01-01 ENCOUNTER — Ambulatory Visit: Payer: Medicare PPO | Admitting: Urology

## 2023-01-09 ENCOUNTER — Encounter: Payer: Self-pay | Admitting: Family

## 2023-01-09 ENCOUNTER — Ambulatory Visit: Payer: Medicare PPO | Admitting: Family

## 2023-01-09 VITALS — BP 110/74 | HR 75 | Temp 97.5°F | Ht 62.0 in | Wt 187.2 lb

## 2023-01-09 DIAGNOSIS — Z23 Encounter for immunization: Secondary | ICD-10-CM

## 2023-01-09 DIAGNOSIS — M159 Polyosteoarthritis, unspecified: Secondary | ICD-10-CM

## 2023-01-09 DIAGNOSIS — E669 Obesity, unspecified: Secondary | ICD-10-CM | POA: Diagnosis not present

## 2023-01-09 DIAGNOSIS — Z0001 Encounter for general adult medical examination with abnormal findings: Secondary | ICD-10-CM

## 2023-01-09 DIAGNOSIS — F411 Generalized anxiety disorder: Secondary | ICD-10-CM | POA: Diagnosis not present

## 2023-01-09 DIAGNOSIS — E782 Mixed hyperlipidemia: Secondary | ICD-10-CM | POA: Diagnosis not present

## 2023-01-09 DIAGNOSIS — K219 Gastro-esophageal reflux disease without esophagitis: Secondary | ICD-10-CM

## 2023-01-09 DIAGNOSIS — Z79899 Other long term (current) drug therapy: Secondary | ICD-10-CM | POA: Diagnosis not present

## 2023-01-09 DIAGNOSIS — F5101 Primary insomnia: Secondary | ICD-10-CM

## 2023-01-09 DIAGNOSIS — F330 Major depressive disorder, recurrent, mild: Secondary | ICD-10-CM

## 2023-01-09 DIAGNOSIS — Z0000 Encounter for general adult medical examination without abnormal findings: Secondary | ICD-10-CM

## 2023-01-09 MED ORDER — ALPRAZOLAM 0.25 MG PO TABS
0.2500 mg | ORAL_TABLET | Freq: Every evening | ORAL | 2 refills | Status: DC | PRN
Start: 2023-01-09 — End: 2023-07-14

## 2023-01-09 MED ORDER — DICLOFENAC SODIUM 75 MG PO TBEC
75.0000 mg | DELAYED_RELEASE_TABLET | Freq: Two times a day (BID) | ORAL | 1 refills | Status: DC
Start: 2023-01-09 — End: 2023-07-14

## 2023-01-09 NOTE — Progress Notes (Signed)
Subjective:    Patient ID: Sabrina Stein, female    DOB: [birth year], 72 y.o.   MRN: [id number]  Chief Complaint  Patient presents with   Follow-up   Pt presents to the office today for CPE and  to follow up on creatinine. She was seen on 11/08/22 and her creatinine had jumped to 1.44. We told her to hold her diclofenac  BID.   However, after three days she reports she started having foot pain and could not walk. She restarted it.   She was prediabetic, but has been doing weight watchers. She has lost 15 lb.     01/09/2023   10:39 AM 11/03/2022    9:10 AM 07/31/2022   10:10 AM  Last 3 Weights  Weight (lbs) 187 lb 3.2 oz 191 lb 6.4 oz 202 lb  Weight (kg) 84.913 kg 86.818 kg 91.627 kg     Anxiety Presents for follow-up visit. Symptoms include excessive worry, insomnia and nervous/anxious behavior. Symptoms occur occasionally. The severity of symptoms is mild.    Depression        This is a chronic problem.  The current episode started more than 1 year ago.   Associated symptoms include insomnia.  Associated symptoms include no helplessness, no hopelessness and not sad.  Past treatments include SNRIs - Serotonin and norepinephrine reuptake inhibitors.  Past medical history includes anxiety.   Gastroesophageal Reflux She complains of belching and heartburn. This is a chronic problem. The current episode started more than 1 year ago. The problem occurs rarely. She has tried a PPI for the symptoms. The treatment provided moderate relief.  Arthritis Presents for follow-up visit. She complains of pain and stiffness. Affected locations include the right foot, left foot, left toes, right toes and right ankle. Her pain is at a severity of 1/10 (with NSAIDs).  Insomnia Primary symptoms: sleep disturbance, difficulty falling asleep.   The current episode started more than one year. The onset quality is gradual. Past treatments include medication. The treatment provided moderate relief. PMH  includes: depression.       Review of Systems  Gastrointestinal:  Positive for heartburn.  Musculoskeletal:  Positive for arthritis and stiffness.  Psychiatric/Behavioral:  Positive for depression and sleep disturbance. The patient is nervous/anxious and has insomnia.   All other systems reviewed and are negative.  Family History  Problem Relation Age of Onset   Cancer Father        lung   Healthy Sister    Healthy Daughter    Healthy Son    Social History   Socioeconomic History   Marital status: Married    Spouse name: Molly Maduro   Number of children: 2   Years of education: 18   Highest education level: Manufacturing engineer (e.g., MA, MS, MEng, MEd, MSW, MBA)  Occupational History   Occupation: Retired   Tobacco Use   Smoking status: Former    Current packs/day: 0.00    Average packs/day: 1 pack/day for 43.0 years (43.0 ttl pk-yrs)    Types: Cigarettes    Start date: 01/25/1969    Quit date: 01/26/2012    Years since quitting: 10.9   Smokeless tobacco: Never  Vaping Use   Vaping status: Never Used  Substance and Sexual Activity   Alcohol use: Yes    Comment: occassional-glass of wine once a month   Drug use: No   Sexual activity: Yes  Other Topics Concern   Not on file  Social History Narrative   Not  on file   Social Determinants of Health   Financial Resource Strain: Low Risk  (01/06/2023)   Overall Financial Resource Strain (CARDIA)    Difficulty of Paying Living Expenses: Not hard at all  Food Insecurity: No Food Insecurity (01/06/2023)   Hunger Vital Sign    Worried About Running Out of Food in the Last Year: Never true    Ran Out of Food in the Last Year: Never true  Transportation Needs: No Transportation Needs (01/06/2023)   PRAPARE - Administrator, Civil Service (Medical): No    Lack of Transportation (Non-Medical): No  Physical Activity: Insufficiently Active (01/06/2023)   Exercise Vital Sign    Days of Exercise per Week: 2 days     Minutes of Exercise per Session: 60 min  Stress: Stress Concern Present (01/06/2023)   [employer] of Occupational Health - Occupational Stress Questionnaire    Feeling of Stress : To some extent  Social Connections: Moderately Integrated (01/06/2023)   Social Connection and Isolation Panel [NHANES]    Frequency of Communication with Friends and Family: More than three times a week    Frequency of Social Gatherings with Friends and Family: Once a week    Attends Religious Services: Never    Database administrator or Organizations: Yes    Attends Engineer, structural: More than 4 times per year    Marital Status: Married        Objective:   Physical Exam Vitals reviewed.  Constitutional:      General: She is not in acute distress.    Appearance: She is well-developed.  HENT:     Head: Normocephalic and atraumatic.     Right Ear: Tympanic membrane normal.     Left Ear: Tympanic membrane normal.  Eyes:     Pupils: Pupils are equal, round, and reactive to light.  Neck:     Thyroid: No thyromegaly.  Cardiovascular:     Rate and Rhythm: Normal rate and regular rhythm.     Heart sounds: Normal heart sounds. No murmur heard. Pulmonary:     Effort: Pulmonary effort is normal. No respiratory distress.     Breath sounds: Normal breath sounds. No wheezing.  Abdominal:     General: Bowel sounds are normal. There is no distension.     Palpations: Abdomen is soft.     Tenderness: There is no abdominal tenderness.  Musculoskeletal:        General: No tenderness. Normal range of motion.     Cervical back: Normal range of motion and neck supple.  Skin:    General: Skin is warm and dry.  Neurological:     Mental Status: She is alert and oriented to person, place, and time.     Cranial Nerves: No cranial nerve deficit.     Deep Tendon Reflexes: Reflexes are normal and symmetric.  Psychiatric:        Behavior: Behavior normal.        Thought Content: Thought content  normal.        Judgment: Judgment normal.       BP 110/74   Pulse 75   Temp (!) 97.5 F (36.4 C) (Temporal)   Ht 5\' 2"  (1.575 m)   Wt 187 lb 3.2 oz (84.9 kg)   SpO2 96%   BMI 34.24 kg/m      Assessment & Plan:  Sabrina Stein comes in today with chief complaint of Follow-up   Diagnosis and orders addressed:  1. Encounter for immunization - Flu Vaccine Trivalent High Dose (Fluad) - CBC with Differential/Platelet - CMP14+EGFR  2. GAD (generalized anxiety disorder) - CBC with Differential/Platelet - CMP14+EGFR - ALPRAZolam (XANAX) 0.25 MG tablet; Take 1 tablet (0.25 mg total) by mouth at bedtime as needed for anxiety.  Dispense: 20 tablet; Refill: 2  3. Gastroesophageal reflux disease without esophagitis - CBC with Differential/Platelet - CMP14+EGFR  4. Mild episode of recurrent major depressive disorder (HCC) - CBC with Differential/Platelet - CMP14+EGFR  5. Annual physical exam - CBC with Differential/Platelet - CMP14+EGFR - Lipid panel  6. Controlled substance agreement signed - CBC with Differential/Platelet - CMP14+EGFR - ALPRAZolam (XANAX) 0.25 MG tablet; Take 1 tablet (0.25 mg total) by mouth at bedtime as needed for anxiety.  Dispense: 20 tablet; Refill: 2  7. Mixed hyperlipidemia - CBC with Differential/Platelet - CMP14+EGFR - Lipid panel  8. Obesity (BMI 30-39.9) - CBC with Differential/Platelet - CMP14+EGFR  9. Osteoarthritis of multiple joints, unspecified osteoarthritis type - CBC with Differential/Platelet - CMP14+EGFR - diclofenac (VOLTAREN) 75 MG EC tablet; Take 1 tablet (75 mg total) by mouth 2 (two) times daily.  Dispense: 60 tablet; Refill: 1  10. Primary insomnia - CBC with Differential/Platelet - CMP14+EGFR   Labs pending Patient reviewed in [place] controlled database, no flags noted. Contract and drug screen are up to date.  Health Maintenance reviewed Diet and exercise encouraged  Follow up plan: 3 months    Jannifer Rodney,  FNP

## 2023-01-15 ENCOUNTER — Ambulatory Visit: Payer: Medicare PPO | Admitting: Urology

## 2023-01-15 VITALS — BP 145/83 | HR 67

## 2023-01-15 DIAGNOSIS — N3281 Overactive bladder: Secondary | ICD-10-CM | POA: Diagnosis not present

## 2023-01-15 LAB — MICROSCOPIC EXAMINATION
RBC, Urine: NONE SEEN /[HPF] (ref 0–2)
WBC, UA: >30 /[HPF] — AB (ref 0–5)

## 2023-01-15 LAB — URINALYSIS, ROUTINE W REFLEX MICROSCOPIC
Bilirubin, UA: NEGATIVE
Glucose, UA: NEGATIVE
Ketones, UA: NEGATIVE
Nitrite, UA: POSITIVE — AB
Protein,UA: NEGATIVE
RBC, UA: NEGATIVE
Specific Gravity, UA: 1.02 (ref 1.005–1.030)
Urobilinogen, Ur: 0.2 mg/dL (ref 0.2–1.0)
pH, UA: 6.5 (ref 5.0–7.5)

## 2023-01-15 NOTE — Progress Notes (Signed)
01/15/2023 11:59 AM   Sabrina Stein March 15, 1951 [id number]  Referring provider: [name], FNP [id number] [place],  [place] [address]  No chief complaint on file.   HPI:  F/u -     1) OAB, incontinence - since 2021. Significant urgency, occasional hesitancy of stream, occasional incomplete emptying, and nocturia of 2-3 voids per night.  Detrol LA Rx on 07/19/21 and pt states it helped a little at first. Wears one pad during the day and one at night.  No pain with intercourse. Surgical history significant for cesarean x2 and appendectomy.  She has had no hormone replacement in many years. PVR was 0. No NG risk.    She stopped Detrol and started [place] in 2023. She takes Miralax for constipation. Leakage improved. PVR was 0.    She continued myrbetriq 50 mg. Leakage has worsened. Gets a sudden urge and will wet a pad. Using 2 ppd. Sometimes 2 ppd at night. Foot on the floor and turn key. She does leak with sneeze. No constipation. She drinks mainly water.   Today, seen for the above. We added solifenacin to her myrbetriq. We switched to tolterodine. She is doing much better. Still some urgency but manageable and less incontinence.    PMH: Past Medical History:  Diagnosis Date   Depression    Hyperlipidemia     Surgical History: Past Surgical History:  Procedure Laterality Date   APPENDECTOMY     CESAREAN SECTION     x2    Home Medications:  Allergies as of 01/15/2023       Reactions   Codeine Nausea And Vomiting        Medication List        Accurate as of January 15, 2023 11:59 AM. If you have any questions, ask your nurse or doctor.          ALPRAZolam 0.25 MG tablet Commonly known as: XANAX Take 1 tablet (0.25 mg total) by mouth at bedtime as needed for anxiety.   atorvastatin 20 MG tablet Commonly known as: LIPITOR Take 1 tablet (20 mg total) by mouth daily.   CALCIUM 1200 PO Take by mouth.   diclofenac 75 MG EC tablet Commonly  known as: VOLTAREN Take 1 tablet (75 mg total) by mouth 2 (two) times daily.   mirabegron ER 50 MG Tb24 tablet Commonly known as: MYRBETRIQ Take 1 tablet (50 mg total) by mouth daily.   omeprazole 40 MG capsule Commonly known as: PRILOSEC TAKE 1 CAPSULE (40 MG TOTAL) BY MOUTH DAILY.   tolterodine 4 MG 24 hr capsule Commonly known as: DETROL LA Take 1 capsule (4 mg total) by mouth daily.   venlafaxine XR 75 MG 24 hr capsule Commonly known as: EFFEXOR-XR Take 1 capsule (75 mg total) by mouth daily.   vitamin C 1000 MG tablet Take 1,000 mg by mouth daily.   Vitamin D3 1.25 MG (50000 UT) Caps Take by mouth.   zinc gluconate 50 MG tablet Take 50 mg by mouth daily.        Allergies:  Allergies  Allergen Reactions   Codeine Nausea And Vomiting    Family History: Family History  Problem Relation Age of Onset   Cancer Father        lung   Healthy Sister    Healthy Daughter    Healthy Son     Social History:  reports that she quit smoking about 10 years ago. Her smoking use included cigarettes. She started  smoking about 54 years ago. She has a 43 pack-year smoking history. She has never used smokeless tobacco. She reports current alcohol use. She reports that she does not use drugs.   Physical Exam: BP (!) 145/83   Pulse 67   Constitutional:  Alert and oriented, No acute distress. HEENT: [place] AT, moist mucus membranes.  Trachea midline, no masses. Cardiovascular: No clubbing, cyanosis, or edema. Respiratory: Normal respiratory effort, no increased work of breathing. GI: Abdomen is soft, nontender, nondistended, no abdominal masses GU: No CVA tenderness Skin: No rashes, bruises or suspicious lesions. Neurologic: Grossly intact, no focal deficits, moving all 4 extremities. Psychiatric: Normal mood and affect.  Laboratory Data: Lab Results  Component Value Date   WBC 5.3 11/03/2022   HGB 11.8 11/03/2022   HCT 34.6 11/03/2022   MCV 94 11/03/2022   PLT 330  11/03/2022    Lab Results  Component Value Date   CREATININE 1.44 (H) 11/08/2022    No results found for: "PSA"  No results found for: "TESTOSTERONE"  Lab Results  Component Value Date   HGBA1C 5.7 (H) 07/31/2022    Urinalysis    Component Value Date/Time   APPEARANCEUR Clear 09/25/2022 1437   GLUCOSEU Negative 09/25/2022 1437   BILIRUBINUR Negative 09/25/2022 1437   PROTEINUR Trace 09/25/2022 1437   NITRITE Negative 09/25/2022 1437   LEUKOCYTESUR Negative 09/25/2022 1437    Lab Results  Component Value Date   LABMICR Comment 09/25/2022    Pertinent Imaging: N/a    Assessment & Plan:    1. OAB (overactive bladder) Continue combination therapy.    No follow-ups on file.  Jerilee Field, MD  [facility]  [address] [place], [place] [address] [phone]

## 2023-01-17 ENCOUNTER — Other Ambulatory Visit: Payer: Self-pay | Admitting: Family

## 2023-01-17 DIAGNOSIS — E782 Mixed hyperlipidemia: Secondary | ICD-10-CM

## 2023-02-05 ENCOUNTER — Ambulatory Visit: Payer: Medicare PPO | Admitting: Family

## 2023-02-14 ENCOUNTER — Other Ambulatory Visit: Payer: Self-pay | Admitting: Family

## 2023-05-14 ENCOUNTER — Ambulatory Visit: Payer: Self-pay | Admitting: Family

## 2023-05-14 NOTE — Telephone Encounter (Signed)
  Chief Complaint: Abdominal pain Symptoms: left sided abdominal pain Frequency: started two days ago Pertinent Negatives: Patient denies fever, SOB, CP, nausea Disposition: [] ED /[] Urgent Care (no appt availability in office) / [x] Appointment(In office/virtual)/ []  [facility]/ [] Home Care/ [] Refused Recommended Disposition /[] [facility]/ []  Follow-up with PCP Additional Notes: patient called with complaints of left sided abdominal pain that started two days ago. Patient states pain comes and goes. Denies fever, SOB, CP and nausea. Patient states pain never gets above a six. Per protocol, the recommendation is for an appointment. Appointment made for 05/15/2023 at 9:35 am with DOD. Patient verbalized understanding of plan and all questions answered.   Copied from CRM [id number]. Topic: Clinical - Red Word Triage >> May 14, 2023  1:46 PM Clayton Bibles wrote: Red Word that prompted transfer to Nurse Triage: Left side pain; level of pain sitting is a 2; taking a deep breathe is a 8 or 9 Reason for Disposition  Age > 60 years  Answer Assessment - Initial Assessment Questions 1. LOCATION: "Where does it hurt?"      Upper left side 2. RADIATION: "Does the pain shoot anywhere else?" (e.g., chest, back)     No 3. ONSET: "When did the pain begin?" (e.g., minutes, hours or days ago)      Started 2 days ago 4. SUDDEN: "Gradual or sudden onset?"     gradual 5. PATTERN "Does the pain come and go, or is it constant?"    - If it comes and goes: "How long does it last?" "Do you have pain now?"     (Note: Comes and goes means the pain is intermittent. It goes away completely between bouts.)    - If constant: "Is it getting better, staying the same, or getting worse?"      (Note: Constant means the pain never goes away completely; most serious pain is constant and gets worse.)      Comes and goes 6. SEVERITY: "How bad is the pain?"  (e.g., Scale 1-10; mild, moderate, or severe)    - MILD  (1-3): Doesn't interfere with normal activities, abdomen soft and not tender to touch.     - MODERATE (4-7): Interferes with normal activities or awakens from sleep, abdomen tender to touch.     - SEVERE (8-10): Excruciating pain, doubled over, unable to do any normal activities.         Mild-pain is currently 1 out of 10    7. RECURRENT SYMPTOM: "Have you ever had this type of stomach pain before?" If Yes, ask: "When was the last time?" and "What happened that time?"      No 8. CAUSE: "What do you think is causing the stomach pain?"     unsure 9. RELIEVING/AGGRAVATING FACTORS: "What makes it better or worse?" (e.g., antacids, bending or twisting motion, bowel movement)     No 10. OTHER SYMPTOMS: "Do you have any other symptoms?" (e.g., back pain, diarrhea, fever, urination pain, vomiting)       No  Protocols used: Abdominal Pain - Female-A-AH

## 2023-05-15 ENCOUNTER — Ambulatory Visit: Payer: Medicare PPO | Admitting: Nurse Practitioner

## 2023-05-15 VITALS — BP 138/72 | HR 69 | Temp 97.8°F | Ht 62.0 in | Wt 190.2 lb

## 2023-05-15 DIAGNOSIS — K219 Gastro-esophageal reflux disease without esophagitis: Secondary | ICD-10-CM | POA: Diagnosis not present

## 2023-05-15 DIAGNOSIS — R1012 Left upper quadrant pain: Secondary | ICD-10-CM | POA: Insufficient documentation

## 2023-05-15 MED ORDER — OMEPRAZOLE 40 MG PO CPDR: 40.0000 mg | DELAYED_RELEASE_CAPSULE | Freq: Every day | ORAL | 1 refills | Status: DC

## 2023-05-15 NOTE — Progress Notes (Signed)
Acute Office Visit  Subjective:     Patient ID: Sabrina Stein, female    DOB: 1950-07-04, 73 y.o.   MRN: [id number]  Chief Complaint  Patient presents with   Abdominal Pain    Left side pain for 3 days but is better today mainly when coughing or taking deep breath     HPI Sabrina Stein is a 73 yrs old female presents 05/15/2023 for an acute visit c/o od abdominla pain 73 year old female with a history of GERD presents with a 3-day episode of abdominal discomfort, which has improved this morning. She reports consuming a few glasses of wine and 2-3 cans of soft drinks daily. She denies nausea, vomiting, or changes in sleep. The improvement in symptoms suggests a possible mild GERD flare-up, likely exacerbated by her alcohol and soft drink intake.   Active Ambulatory Problems    Diagnosis Date Noted   Obesity (BMI 30-39.9) 12/06/2015   Primary insomnia 06/05/2016   Mixed hyperlipidemia 06/05/2016   Mild episode of recurrent major depressive disorder (HCC) 08/07/2018   Restless leg 02/12/2019   Overactive bladder 09/24/2019   Controlled substance agreement signed 09/24/2019   Gastroesophageal reflux disease without esophagitis 09/24/2019   Prediabetes 09/24/2019   Osteoarthritis of multiple joints 04/08/2020   Pain of left heel 09/08/2020   Mixed stress and urge urinary incontinence 07/26/2021   GAD (generalized anxiety disorder) 07/26/2021   Hepatic steatosis 11/03/2022   Left upper quadrant abdominal pain 05/15/2023   Resolved Ambulatory Problems    Diagnosis Date Noted   Hyperglycemia 06/05/2016   Past Medical History:  Diagnosis Date   Depression    Hyperlipidemia     Review of Systems  Constitutional:  Negative for chills and fever.  HENT:  Negative for congestion and sore throat.   Respiratory:  Negative for cough and shortness of breath.   Cardiovascular:  Negative for chest pain and leg swelling.  Gastrointestinal:  Positive for abdominal pain. Negative for nausea  and vomiting.  Genitourinary:  Negative for frequency, hematuria and urgency.  Skin:  Negative for itching and rash.  Neurological:  Negative for dizziness and headaches.   Negative unless indicated in HPI    Objective:    BP 138/72   Pulse 69   Temp 97.8 F (36.6 C) (Temporal)   Ht 5\' 2"  (1.575 m)   Wt 190 lb 3.2 oz (86.3 kg)   SpO2 99%   BMI 34.79 kg/m  BP Readings from Last 3 Encounters:  05/15/23 138/72  01/15/23 (!) 145/83  01/09/23 110/74   Wt Readings from Last 3 Encounters:  05/15/23 190 lb 3.2 oz (86.3 kg)  01/09/23 187 lb 3.2 oz (84.9 kg)  11/03/22 191 lb 6.4 oz (86.8 kg)      Physical Exam Vitals and nursing note reviewed.  Constitutional:      General: She is not in acute distress. HENT:     Head: Normocephalic and atraumatic.     Nose: Nose normal.     Mouth/Throat:     Mouth: Mucous membranes are moist.  Eyes:     Extraocular Movements: Extraocular movements intact.     Conjunctiva/sclera: Conjunctivae normal.     Pupils: Pupils are equal, round, and reactive to light.  Cardiovascular:     Heart sounds: Normal heart sounds.  Abdominal:     General: There is no distension.     Palpations: There is no mass.     Tenderness: There is no abdominal tenderness. There is no guarding  or rebound.     Hernia: No hernia is present.  Musculoskeletal:        General: Normal range of motion.     Right lower leg: No edema.     Left lower leg: No edema.  Skin:    General: Skin is warm and dry.     Findings: No rash.  Neurological:     Mental Status: She is alert and oriented to person, place, and time.  Psychiatric:        Mood and Affect: Mood normal.        Behavior: Behavior normal.        Thought Content: Thought content normal.        Judgment: Judgment normal.     No results found for any visits on 05/15/23.      Assessment & Plan:  Gastroesophageal reflux disease without esophagitis -     Omeprazole; Take 1 capsule (40 mg total) by mouth  daily.  Dispense: 90 capsule; Refill: 1  Left upper quadrant abdominal pain -     Omeprazole; Take 1 capsule (40 mg total) by mouth daily.  Dispense: 90 capsule; Refill: 1   [name] is 73 year old Caucasian female seen today for abdominal pain/GERD exacerbation, no acute distress GERD: increase omeprazole BID for 2 weeks,  Reducing alcohol and soft drink consumption, reviewing her GERD management, and monitoring for any further symptom Encourage healthy lifestyle choices, including diet (rich in fruits, vegetables, and lean proteins, and low in salt and simple carbohydrates) and exercise (at least 30 minutes of moderate physical activity daily).     The above assessment and management plan was discussed with the patient. The patient verbalized understanding of and has agreed to the management plan. Patient is aware to call the clinic if they develop any new symptoms or if symptoms persist or worsen. Patient is aware when to return to the clinic for a follow-up visit. Patient educated on when it is appropriate to go to the emergency department.   Return if symptoms worsen or fail to improve.    Note: This document was prepared by [employer] voice dictation technology and any errors that results from this process are unintentional.

## 2023-05-26 ENCOUNTER — Emergency Department (HOSPITAL_COMMUNITY)

## 2023-05-26 ENCOUNTER — Encounter (HOSPITAL_COMMUNITY): Payer: Self-pay | Admitting: Pharmacy Technician

## 2023-05-26 ENCOUNTER — Emergency Department (HOSPITAL_COMMUNITY)
Admission: EM | Admit: 2023-05-26 | Discharge: 2023-05-26 | Disposition: A | Discharge status: Home / Self Care | Attending: Emergency Medicine | Admitting: Emergency Medicine

## 2023-05-26 ENCOUNTER — Other Ambulatory Visit: Payer: Self-pay

## 2023-05-26 DIAGNOSIS — R1012 Left upper quadrant pain: Secondary | ICD-10-CM | POA: Diagnosis not present

## 2023-05-26 DIAGNOSIS — N3001 Acute cystitis with hematuria: Secondary | ICD-10-CM | POA: Diagnosis not present

## 2023-05-26 DIAGNOSIS — I517 Cardiomegaly: Secondary | ICD-10-CM | POA: Diagnosis not present

## 2023-05-26 DIAGNOSIS — R935 Abnormal findings on diagnostic imaging of other abdominal regions, including retroperitoneum: Secondary | ICD-10-CM | POA: Diagnosis not present

## 2023-05-26 DIAGNOSIS — N281 Cyst of kidney, acquired: Secondary | ICD-10-CM | POA: Diagnosis not present

## 2023-05-26 DIAGNOSIS — R109 Unspecified abdominal pain: Secondary | ICD-10-CM | POA: Diagnosis not present

## 2023-05-26 DIAGNOSIS — R1084 Generalized abdominal pain: Secondary | ICD-10-CM | POA: Diagnosis not present

## 2023-05-26 DIAGNOSIS — Z87891 Personal history of nicotine dependence: Secondary | ICD-10-CM | POA: Diagnosis not present

## 2023-05-26 DIAGNOSIS — K529 Noninfective gastroenteritis and colitis, unspecified: Secondary | ICD-10-CM | POA: Insufficient documentation

## 2023-05-26 DIAGNOSIS — N858 Other specified noninflammatory disorders of uterus: Secondary | ICD-10-CM | POA: Diagnosis not present

## 2023-05-26 DIAGNOSIS — R9431 Abnormal electrocardiogram [ECG] [EKG]: Secondary | ICD-10-CM | POA: Diagnosis not present

## 2023-05-26 LAB — CBC WITH DIFFERENTIAL/PLATELET
Abs Immature Granulocytes: 0.09 10*3/uL — ABNORMAL HIGH (ref 0.00–0.07)
Basophils Absolute: 0.1 10*3/uL (ref 0.0–0.1)
Basophils Relative: 1 %
Eosinophils Absolute: 0.1 10*3/uL (ref 0.0–0.5)
Eosinophils Relative: 1 %
HCT: 36.3 % (ref 36.0–46.0)
Hemoglobin: 12.1 g/dL (ref 12.0–15.0)
Immature Granulocytes: 1 %
Lymphocytes Relative: 7 %
Lymphs Abs: 0.7 10*3/uL (ref 0.7–4.0)
MCH: 31.2 pg (ref 26.0–34.0)
MCHC: 33.3 g/dL (ref 30.0–36.0)
MCV: 93.6 fL (ref 80.0–100.0)
Monocytes Absolute: 0.6 10*3/uL (ref 0.1–1.0)
Monocytes Relative: 6 %
Neutro Abs: 9.3 10*3/uL — ABNORMAL HIGH (ref 1.7–7.7)
Neutrophils Relative %: 84 %
Platelets: 478 10*3/uL — ABNORMAL HIGH (ref 150–400)
RBC: 3.88 MIL/uL (ref 3.87–5.11)
RDW: 12.7 % (ref 11.5–15.5)
WBC: 10.9 10*3/uL — ABNORMAL HIGH (ref 4.0–10.5)
nRBC: 0 % (ref 0.0–0.2)

## 2023-05-26 LAB — COMPREHENSIVE METABOLIC PANEL
ALT: 19 U/L (ref 0–44)
AST: 18 U/L (ref 15–41)
Albumin: 2.8 g/dL — ABNORMAL LOW (ref 3.5–5.0)
Alkaline Phosphatase: 190 U/L — ABNORMAL HIGH (ref 38–126)
Anion gap: 15 (ref 5–15)
BUN: 23 mg/dL (ref 8–23)
CO2: 22 mmol/L (ref 22–32)
Calcium: 9.1 mg/dL (ref 8.9–10.3)
Chloride: 100 mmol/L (ref 98–111)
Creatinine, Ser: 1.07 mg/dL — ABNORMAL HIGH (ref 0.44–1.00)
GFR, Estimated: 55 mL/min — ABNORMAL LOW (ref >60)
Glucose, Bld: 105 mg/dL — ABNORMAL HIGH (ref 70–99)
Potassium: 4.7 mmol/L (ref 3.5–5.1)
Sodium: 137 mmol/L (ref 135–145)
Total Bilirubin: 0.7 mg/dL (ref 0.0–1.2)
Total Protein: 7.3 g/dL (ref 6.5–8.1)

## 2023-05-26 LAB — URINALYSIS, ROUTINE W REFLEX MICROSCOPIC
Bilirubin Urine: NEGATIVE
Glucose, UA: NEGATIVE mg/dL
Ketones, ur: 20 mg/dL — AB
Nitrite: POSITIVE — AB
Protein, ur: 100 mg/dL — AB
RBC / HPF: >50 RBC/hpf (ref 0–5)
Specific Gravity, Urine: >1.046 — ABNORMAL HIGH (ref 1.005–1.030)
pH: 5 (ref 5.0–8.0)

## 2023-05-26 LAB — LIPASE, BLOOD: Lipase: 43 U/L (ref 11–51)

## 2023-05-26 MED ORDER — ONDANSETRON HCL 4 MG PO TABS: 4.0000 mg | ORAL_TABLET | Freq: Three times a day (TID) | ORAL | 0 refills | Status: DC | PRN

## 2023-05-26 MED ORDER — DICYCLOMINE HCL 20 MG PO TABS: 20.0000 mg | ORAL_TABLET | Freq: Two times a day (BID) | ORAL | 0 refills | Status: DC | PRN

## 2023-05-26 MED ORDER — IOHEXOL 300 MG/ML  SOLN
100.0000 mL | Freq: Once | INTRAMUSCULAR | Status: AC | PRN
  Administered 2023-05-26: 100 mL via INTRAVENOUS

## 2023-05-26 MED ORDER — SODIUM CHLORIDE 0.9 % IV BOLUS
500.0000 mL | Freq: Once | INTRAVENOUS | Status: AC
  Administered 2023-05-26: 500 mL via INTRAVENOUS

## 2023-05-26 MED ORDER — AMOXICILLIN-POT CLAVULANATE 875-125 MG PO TABS
1.0000 | ORAL_TABLET | Freq: Once | ORAL | Status: AC
  Administered 2023-05-26: 1 via ORAL
  Filled 2023-05-26: qty 1

## 2023-05-26 MED ORDER — AMOXICILLIN-POT CLAVULANATE 875-125 MG PO TABS: 1.0000 | ORAL_TABLET | Freq: Two times a day (BID) | ORAL | 0 refills | Status: DC

## 2023-05-26 MED ORDER — ONDANSETRON HCL 4 MG/2ML IJ SOLN
4.0000 mg | Freq: Once | INTRAMUSCULAR | Status: AC
  Administered 2023-05-26: 4 mg via INTRAVENOUS
  Filled 2023-05-26: qty 2

## 2023-05-26 NOTE — ED Notes (Signed)
 Patient transported to CT

## 2023-05-26 NOTE — ED Provider Notes (Signed)
 [facility] EMERGENCY DEPARTMENT AT [facility] Provider Note   CSN: [id number] Arrival date & time: 05/26/23  1324     History  Chief Complaint  Patient presents with   Weakness    Sabrina Stein is a 73 y.o. female with a history including hyperlipidemia, osteoarthritis, GERD, surgical history significant for hyperlipidemia, history also significant for hepatic steatosis presenting for evaluation of a 2-week history of left abdominal pain.  She describes pain that has been severe which originates in her left upper quadrant and radiates down into her left lower pelvic region, initially it was constant, improved since her PCP doubled her dose of omeprazole but still having sharp stabs of pain intermittently in the left upper quadrant again radiating down to the lower.  She also endorses nausea without emesis, poor appetite, stating she has had a 10 pound weight loss since her symptoms began.  She denies diarrhea or constipation, she had a normal bowel movement to day, nonbloody, did not improve or worsen her symptoms.  The history is provided by the patient.       Home Medications Prior to Admission medications   Medication Sig Start Date End Date Taking? Authorizing Provider  amoxicillin-clavulanate (AUGMENTIN) 875-125 MG tablet Take 1 tablet by mouth every 12 (twelve) hours. 05/26/23  Yes Evany Schecter, Raynelle Fanning, PA-C  dicyclomine (BENTYL) 20 MG tablet Take 1 tablet (20 mg total) by mouth 2 (two) times daily as needed for spasms. 05/26/23  Yes Canda Podgorski, Raynelle Fanning, PA-C  ondansetron (ZOFRAN) 4 MG tablet Take 1 tablet (4 mg total) by mouth every 8 (eight) hours as needed for nausea. 05/26/23  Yes Ryson Bacha, Raynelle Fanning, PA-C  ALPRAZolam Prudy Feeler) 0.25 MG tablet Take 1 tablet (0.25 mg total) by mouth at bedtime as needed for anxiety. 01/09/23   Jannifer Rodney A, FNP  Ascorbic Acid (VITAMIN C) 1000 MG tablet Take 1,000 mg by mouth daily.    [provider]  atorvastatin (LIPITOR) 20 MG tablet TAKE 1 TABLET BY  MOUTH EVERY DAY 01/17/23   Jannifer Rodney A, FNP  Calcium Carbonate-Vit D-Min (CALCIUM 1200 PO) Take by mouth.    [provider]  Cholecalciferol (VITAMIN D3) 1.25 MG (50000 UT) CAPS Take by mouth.    [provider]  diclofenac (VOLTAREN) 75 MG EC tablet Take 1 tablet (75 mg total) by mouth 2 (two) times daily. 01/09/23   Jannifer Rodney A, FNP  mirabegron ER (MYRBETRIQ) 50 MG TB24 tablet Take 1 tablet (50 mg total) by mouth daily. 07/31/22   [name], FNP  omeprazole (PRILOSEC) 40 MG capsule Take 1 capsule (40 mg total) by mouth daily. 05/15/23   St Vena Austria, NP  tolterodine (DETROL LA) 4 MG 24 hr capsule Take 1 capsule (4 mg total) by mouth daily. 09/25/22   Jerilee Field, MD  venlafaxine XR (EFFEXOR-XR) 75 MG 24 hr capsule Take 1 capsule (75 mg total) by mouth daily. 07/31/22   Jannifer Rodney A, FNP  zinc gluconate 50 MG tablet Take 50 mg by mouth daily.    [provider]      Allergies    Codeine    Review of Systems   Review of Systems  Constitutional:  Positive for appetite change. Negative for fever.  HENT:  Negative for congestion and sore throat.   Eyes: Negative.   Respiratory:  Negative for chest tightness and shortness of breath.   Cardiovascular:  Negative for chest pain.  Gastrointestinal:  Positive for abdominal pain and nausea. Negative for  blood in stool, constipation, diarrhea and vomiting.  Genitourinary: Negative.  Negative for dysuria.  Musculoskeletal:  Negative for arthralgias, joint swelling and neck pain.  Skin: Negative.  Negative for rash and wound.  Neurological:  Negative for dizziness, weakness, light-headedness, numbness and headaches.  Psychiatric/Behavioral: Negative.      Physical Exam Updated Vital Signs BP 136/87   Pulse 78   Temp 98.8 F (37.1 C)   Resp 18   SpO2 97%  Physical Exam Vitals and nursing note reviewed.  Constitutional:      Appearance: She is well-developed.  HENT:     Head:  Normocephalic and atraumatic.  Eyes:     Conjunctiva/sclera: Conjunctivae normal.  Cardiovascular:     Rate and Rhythm: Normal rate and regular rhythm.     Heart sounds: Normal heart sounds.  Pulmonary:     Effort: Pulmonary effort is normal.     Breath sounds: Normal breath sounds. No wheezing.  Abdominal:     General: Abdomen is protuberant. Bowel sounds are normal. There is no distension.     Palpations: Abdomen is soft.     Tenderness: There is abdominal tenderness in the left upper quadrant and left lower quadrant. There is no guarding or rebound.     Hernia: No hernia is present.  Musculoskeletal:        General: Normal range of motion.     Cervical back: Normal range of motion.  Skin:    General: Skin is warm and dry.  Neurological:     Mental Status: She is alert.     ED Results / Procedures / Treatments   Labs (all labs ordered are listed, but only abnormal results are displayed) Labs Reviewed  CBC WITH DIFFERENTIAL/PLATELET - Abnormal; Notable for the following components:      Result Value   WBC 10.9 (*)    Platelets 478 (*)    Neutro Abs 9.3 (*)    Abs Immature Granulocytes 0.09 (*)    All other components within normal limits  URINALYSIS, ROUTINE W REFLEX MICROSCOPIC - Abnormal; Notable for the following components:   APPearance HAZY (*)    Specific Gravity, Urine >1.046 (*)    Hgb urine dipstick LARGE (*)    Ketones, ur 20 (*)    Protein, ur 100 (*)    Nitrite POSITIVE (*)    Leukocytes,Ua TRACE (*)    Bacteria, UA RARE (*)    All other components within normal limits  COMPREHENSIVE METABOLIC PANEL - Abnormal; Notable for the following components:   Glucose, Bld 105 (*)    Creatinine, Ser 1.07 (*)    Albumin 2.8 (*)    Alkaline Phosphatase 190 (*)    GFR, Estimated 55 (*)    All other components within normal limits  LIPASE, BLOOD    EKG None  Radiology DG Chest Portable 1 View Result Date: 05/26/2023 CLINICAL DATA:  Abdominal pain. EXAM:  PORTABLE CHEST 1 VIEW COMPARISON:  None Available. FINDINGS: Apical lordotic positioning. Midline trachea. Borderline cardiomegaly. No pleural effusion or pneumothorax. Diffuse interstitial thickening/coarsening is likely related to past smoking history. No lobar consolidation. No free intraperitoneal air. IMPRESSION: No acute findings. Electronically Signed   By: Jeronimo Greaves M.D.   On: 05/26/2023 13:59   CT ABDOMEN PELVIS W CONTRAST Result Date: 05/26/2023 CLINICAL DATA:  Left upper quadrant and left lower quadrant abdominal pain for 2 weeks. EXAM: CT ABDOMEN AND PELVIS WITH CONTRAST TECHNIQUE: Multidetector CT imaging of the abdomen and pelvis was performed  using the standard protocol following bolus administration of intravenous contrast. RADIATION DOSE REDUCTION: This exam was performed according to the departmental dose-optimization program which includes automated exposure control, adjustment of the mA and/or kV according to patient size and/or use of iterative reconstruction technique. CONTRAST:  OMNIPAQUE IOHEXOL 300 MG/ML  SOLN COMPARISON:  Abdominal ultrasound dated 08/17/2022. FINDINGS: Lower chest: No acute abnormality. Hepatobiliary: No focal liver abnormality is seen. No gallstones, gallbladder wall thickening, or biliary dilatation. Pancreas: Unremarkable. No pancreatic ductal dilatation or surrounding inflammatory changes. Spleen: Normal in size without focal abnormality. Adrenals/Urinary Tract: Adrenal glands are unremarkable. There is a left renal cyst which measures 2.0 cm in size. No imaging follow-up is recommended. No renal calculi or hydronephrosis on either side. Bladder is unremarkable. Stomach/Bowel: Stomach is within normal limits. There is bowel wall thickening of the cecum and ascending colon. No significant surrounding inflammatory changes. The appendix is not definitely identified. No evidence of bowel wall thickening, distention, or inflammatory changes. Vascular/Lymphatic:  Aortic atherosclerosis. No enlarged abdominal or pelvic lymph nodes. Reproductive: The uterus appears atrophic. No suspicious adnexal mass. Other: No abdominal wall hernia or abnormality. No abdominopelvic ascites. Musculoskeletal: Degenerative changes are seen in the spine. IMPRESSION: 1. Bowel wall thickening of the cecum and ascending colon may reflect colitis. Aortic Atherosclerosis (ICD10-I70.0). Electronically Signed   By: Romona Curls M.D.   On: 05/26/2023 12:45    Procedures Procedures    Medications Ordered in ED Medications  sodium chloride 0.9 % bolus 500 mL (0 mLs Intravenous Stopped 05/26/23 1208)  ondansetron (ZOFRAN) injection 4 mg (4 mg Intravenous Given 05/26/23 1046)  iohexol (OMNIPAQUE) 300 MG/ML solution 100 mL (100 mLs Intravenous Contrast Given 05/26/23 1212)  amoxicillin-clavulanate (AUGMENTIN) 875-125 MG per tablet 1 tablet (1 tablet Oral Given 05/26/23 1531)    ED Course/ Medical Decision Making/ A&P                                 Medical Decision Making Patient presenting withWill plan sendIn general weakness, Persistent lowSided abdominal painWith reduced oral intake, nausea without emesis, Weight loss.  Broad differential diagnosis including small bowel obstruction, constipation, diverticulitis, other intra-abdominal process such as abscess/infection/colitis.  Pancreatitis.  Given pain is high in her left upper quadrant this could also represent a pulmonary process such as pneumonia, although no complaint of cough or shortness of breath.  Results as outlined below, she does have what appears to be a right sided colitis which is not completely explain her left-sided symptoms.  However other lab tests have been relatively unremarkable although she does have a UTI.  This still does not explain left-sided pain, there is no evidence for left hydro nephrosis or other urinary tree complication.  Amount and/or Complexity of Data Reviewed Labs: ordered.    Details: C-Met  revealing for an upper albumin of 2.8, her alk phos is elevated at 190 but her LFTs, lipase and bilirubin are all normal range.  She has a borderline leukocytosis at 10.9.  Her urinalysis which was resulted late in her ED stay revealing a specific gravity greater than 1.046, of note she did receive IV fluids and also tolerated p.o. intake while here.  Large number of hemoglobin positive nitrites rare bacteria but 21-50 WBCs.  Augmentin should cover. Radiology: ordered.    Details: CT imaging reviewed,Bowel wall thickening of the cecum and ascending colon suggesting possible colitis Discussion of management or test interpretation with external  provider(s): Patient's symptoms and CT findings were discussed with Dr. Marletta Lor.  He recommended treating her colitis with antibiotics, Augmentin would be a good choice.  He will plan close follow-up with her, he will have his office call her to arrange appointment this coming week.  He indicated that she would need a colonoscopy once she is over this acute process.  This was discussed with patient and she is agreeable.  Risk Prescription drug management.           Final Clinical Impression(s) / ED Diagnoses Final diagnoses:  Left upper quadrant abdominal pain  Colitis  Acute cystitis with hematuria    Rx / DC Orders ED Discharge Orders          Ordered    amoxicillin-clavulanate (AUGMENTIN) 875-125 MG tablet  Every 12 hours        05/26/23 1613    dicyclomine (BENTYL) 20 MG tablet  2 times daily PRN        05/26/23 1613    ondansetron (ZOFRAN) 4 MG tablet  Every 8 hours PRN        05/26/23 1613              Burgess Amor, PA-C 05/26/23 1925    Bethann Berkshire, MD 05/27/23 [phone]

## 2023-05-26 NOTE — ED Triage Notes (Signed)
 Pt bib ems from home with generalized weakness and feeling sick for the last two weeks. Pt also with pain right above umbilicus with radiation to hip.  VSS with ems. Pt went to PCP for same symptpms approx a week ago and told to increase her omeprazole which pt has not had any relief. Pt reports losing 10lbs in the last two weeks.

## 2023-05-26 NOTE — Discharge Instructions (Signed)
 Take the entire course of the Augmentin prescribed.  You may also use Bentyl and Zofran if needed for abdominal cramping and any return of nausea.  As discussed make sure you are drinking plenty of fluids to avoid dehydration, I recommend a bland diet for the next several days as discussed.  Plan to follow-up with Dr. Marletta Lor this week as discussed.

## 2023-05-31 ENCOUNTER — Encounter: Payer: Self-pay | Admitting: Internal Medicine

## 2023-05-31 ENCOUNTER — Ambulatory Visit: Admitting: Internal Medicine

## 2023-05-31 ENCOUNTER — Encounter: Payer: Self-pay | Admitting: *Deleted

## 2023-05-31 VITALS — BP 126/80 | HR 70 | Temp 97.5°F | Ht 62.0 in | Wt 183.2 lb

## 2023-05-31 DIAGNOSIS — R1032 Left lower quadrant pain: Secondary | ICD-10-CM

## 2023-05-31 DIAGNOSIS — R933 Abnormal findings on diagnostic imaging of other parts of digestive tract: Secondary | ICD-10-CM

## 2023-05-31 DIAGNOSIS — K219 Gastro-esophageal reflux disease without esophagitis: Secondary | ICD-10-CM | POA: Diagnosis not present

## 2023-05-31 DIAGNOSIS — R109 Unspecified abdominal pain: Secondary | ICD-10-CM | POA: Diagnosis not present

## 2023-05-31 DIAGNOSIS — R634 Abnormal weight loss: Secondary | ICD-10-CM | POA: Diagnosis not present

## 2023-05-31 DIAGNOSIS — K529 Noninfective gastroenteritis and colitis, unspecified: Secondary | ICD-10-CM

## 2023-05-31 NOTE — H&P (View-Only) (Signed)
 Primary Care Physician:  [name], FNP Primary Gastroenterologist:  Dr. Marletta Lor  Chief Complaint  Patient presents with   Follow-up    Follow up from ED visit. Pt states she is just a little better    HPI:   Sabrina Stein is a 73 y.o. female who presents to clinic today as a new patient for ER follow-up visit.  Patient presented to [facility], ER 05/26/2023 due to left sided abdominal pain.  Severe at times, radiates down the left lower pelvic region.  Initially was constant though started to improve after increasing omeprazole.  Notes poor appetite, nausea without vomiting.  Does note 10 pound weight loss over the last few weeks.  Denies any diarrhea or constipation.  No melena hematochezia.  CT abdomen pelvis with contrast which I personally reviewed showed thickening of the ascending colon/cecum consistent with colitis.  Blood work showed mild leukocytosis of 10.9, albumin 2.8, alk phos 190, otherwise unremarkable.  Patient discharged from the ER on Augmentin.  Today, states she is mildly improved.  Abdominal pain improved though still present.  Primarily left lower quadrant left upper quadrant.  Dicyclomine helping.  Nausea improved, does endorse poor appetite as well as 10 pound weight loss.  No prior colonoscopy.  No family history of colorectal malignancy.  Does have chronic reflux which is well-controlled omeprazole daily.  No dysphagia odynophagia.  No epigastric or chest pain.   Past Medical History:  Diagnosis Date   Depression    Hyperlipidemia     Past Surgical History:  Procedure Laterality Date   APPENDECTOMY     CESAREAN SECTION     x2    Current Outpatient Medications  Medication Sig Dispense Refill   ALPRAZolam (XANAX) 0.25 MG tablet Take 1 tablet (0.25 mg total) by mouth at bedtime as needed for anxiety. 20 tablet 2   amoxicillin-clavulanate (AUGMENTIN) 875-125 MG tablet Take 1 tablet by mouth every 12 (twelve) hours. 14 tablet 0   Ascorbic  Acid (VITAMIN C) 1000 MG tablet Take 1,000 mg by mouth daily.     atorvastatin (LIPITOR) 20 MG tablet TAKE 1 TABLET BY MOUTH EVERY DAY 90 tablet 1   Calcium Carbonate-Vit D-Min (CALCIUM 1200 PO) Take by mouth.     Cholecalciferol (VITAMIN D3) 1.25 MG (50000 UT) CAPS Take by mouth.     diclofenac (VOLTAREN) 75 MG EC tablet Take 1 tablet (75 mg total) by mouth 2 (two) times daily. 60 tablet 1   dicyclomine (BENTYL) 20 MG tablet Take 1 tablet (20 mg total) by mouth 2 (two) times daily as needed for spasms. 20 tablet 0   mirabegron ER (MYRBETRIQ) 50 MG TB24 tablet Take 1 tablet (50 mg total) by mouth daily. 30 tablet 11   omeprazole (PRILOSEC) 40 MG capsule Take 1 capsule (40 mg total) by mouth daily. 90 capsule 1   ondansetron (ZOFRAN) 4 MG tablet Take 1 tablet (4 mg total) by mouth every 8 (eight) hours as needed for nausea. 12 tablet 0   tolterodine (DETROL LA) 4 MG 24 hr capsule Take 1 capsule (4 mg total) by mouth daily. 90 capsule 3   venlafaxine XR (EFFEXOR-XR) 75 MG 24 hr capsule Take 1 capsule (75 mg total) by mouth daily. 90 capsule 3   zinc gluconate 50 MG tablet Take 50 mg by mouth daily.     No current facility-administered medications for this visit.    Allergies as of 05/31/2023 - Review Complete 05/31/2023  Allergen Reaction Noted  Codeine Nausea And Vomiting 12/06/2015    Family History  Problem Relation Age of Onset   Cancer Father        lung   Healthy Sister    Healthy Daughter    Healthy Son     Social History   Socioeconomic History   Marital status: Married    Spouse name: Molly Maduro   Number of children: 2   Years of education: 18   Highest education level: Master's degree (e.g., MA, MS, MEng, MEd, MSW, MBA)  Occupational History   Occupation: Retired   Tobacco Use   Smoking status: Former    Current packs/day: 0.00    Average packs/day: 1 pack/day for 43.0 years (43.0 ttl pk-yrs)    Types: Cigarettes    Start date: 01/25/1969    Quit date: 01/26/2012     Years since quitting: 11.3   Smokeless tobacco: Never  Vaping Use   Vaping status: Never Used  Substance and Sexual Activity   Alcohol use: Yes    Comment: occassional-glass of wine once a month   Drug use: No   Sexual activity: Yes  Other Topics Concern   Not on file  Social History Narrative   Not on file   Social Drivers of Health   Financial Resource Strain: Low Risk  (05/15/2023)   Overall Financial Resource Strain (CARDIA)    Difficulty of Paying Living Expenses: Not hard at all  Food Insecurity: No Food Insecurity (05/15/2023)   Hunger Vital Sign    Worried About Running Out of Food in the Last Year: Never true    Ran Out of Food in the Last Year: Never true  Transportation Needs: No Transportation Needs (05/15/2023)   PRAPARE - Administrator, Civil Service (Medical): No    Lack of Transportation (Non-Medical): No  Physical Activity: Sufficiently Active (05/15/2023)   Exercise Vital Sign    Days of Exercise per Week: 3 days    Minutes of Exercise per Session: 90 min  Stress: No Stress Concern Present (05/15/2023)   [employer] of Occupational Health - Occupational Stress Questionnaire    Feeling of Stress : Only a little  Social Connections: Moderately Integrated (05/15/2023)   Social Connection and Isolation Panel [NHANES]    Frequency of Communication with Friends and Family: Three times a week    Frequency of Social Gatherings with Friends and Family: Once a week    Attends Religious Services: Never    Database administrator or Organizations: Yes    Attends Banker Meetings: 1 to 4 times per year    Marital Status: Married  Catering manager Violence: Not At Risk (02/12/2020)   Humiliation, Afraid, Rape, and Kick questionnaire    Fear of Current or Ex-Partner: No    Emotionally Abused: No    Physically Abused: No    Sexually Abused: No    Subjective: Review of Systems  Constitutional:  Negative for chills and fever.  HENT:   Negative for congestion and hearing loss.   Eyes:  Negative for blurred vision and double vision.  Respiratory:  Negative for cough and shortness of breath.   Cardiovascular:  Negative for chest pain and palpitations.  Gastrointestinal:  Negative for abdominal pain, blood in stool, constipation, diarrhea, heartburn, melena and vomiting.  Genitourinary:  Negative for dysuria and urgency.  Musculoskeletal:  Negative for joint pain and myalgias.  Skin:  Negative for itching and rash.  Neurological:  Negative for dizziness and headaches.  Psychiatric/Behavioral:  Negative for depression. The patient is not nervous/anxious.        Objective: BP 126/80   Pulse 70   Temp (!) 97.5 F (36.4 C)   Ht 5\' 2"  (1.575 m)   Wt 183 lb 3.2 oz (83.1 kg)   BMI 33.51 kg/m  Physical Exam Constitutional:      Appearance: Normal appearance.  HENT:     Head: Normocephalic and atraumatic.  Eyes:     Extraocular Movements: Extraocular movements intact.     Conjunctiva/sclera: Conjunctivae normal.  Cardiovascular:     Rate and Rhythm: Normal rate and regular rhythm.     Heart sounds: Murmur heard.  Pulmonary:     Effort: Pulmonary effort is normal.     Breath sounds: Normal breath sounds.  Abdominal:     General: Bowel sounds are normal.     Palpations: Abdomen is soft.  Musculoskeletal:        General: No swelling. Normal range of motion.     Cervical back: Normal range of motion and neck supple.  Skin:    General: Skin is warm and dry.     Coloration: Skin is not jaundiced.  Neurological:     General: No focal deficit present.     Mental Status: She is alert and oriented to person, place, and time.  Psychiatric:        Mood and Affect: Mood normal.        Behavior: Behavior normal.      Assessment/Plan:  1.  Colitis, abnormal CT of the colon-continue full course of antibiotics.  Will schedule for colonoscopy to further evaluate. The risks including infection, bleed, or perforation as  well as benefits, limitations, alternatives and imponderables have been reviewed with the patient. Questions have been answered. All parties agreeable.  2.  Chronic GERD-well-controlled omeprazole daily.  Will continue.    3.  Abdominal pain-likely due to above, continue dicyclomine as needed.  4.  Weight loss-colonoscopy as above  Follow-up after colonoscopy  05/31/2023 9:12 AM   Disclaimer: This note was dictated with voice recognition software. Similar sounding words can inadvertently be transcribed and may not be corrected upon review.

## 2023-05-31 NOTE — Patient Instructions (Signed)
 We will schedule you for colonoscopy to further evaluate your episode of colitis, abnormal CT scan of your colon, weight loss, abdominal pain.  Continue your antibiotics for the full course.  You can take dicyclomine as well on top of this as needed for pain.  Continue omeprazole for your reflux.  Okay to take once daily.  It was very nice meeting you today.  Dr. Marletta Lor

## 2023-05-31 NOTE — Progress Notes (Signed)
 Primary Care Physician:  [name], FNP Primary Gastroenterologist:  Dr. Marletta Lor  Chief Complaint  Patient presents with   Follow-up    Follow up from ED visit. Pt states she is just a little better    HPI:   Sabrina Stein is a 73 y.o. female who presents to clinic today as a new patient for ER follow-up visit.  Patient presented to [facility], ER 05/26/2023 due to left sided abdominal pain.  Severe at times, radiates down the left lower pelvic region.  Initially was constant though started to improve after increasing omeprazole.  Notes poor appetite, nausea without vomiting.  Does note 10 pound weight loss over the last few weeks.  Denies any diarrhea or constipation.  No melena hematochezia.  CT abdomen pelvis with contrast which I personally reviewed showed thickening of the ascending colon/cecum consistent with colitis.  Blood work showed mild leukocytosis of 10.9, albumin 2.8, alk phos 190, otherwise unremarkable.  Patient discharged from the ER on Augmentin.  Today, states she is mildly improved.  Abdominal pain improved though still present.  Primarily left lower quadrant left upper quadrant.  Dicyclomine helping.  Nausea improved, does endorse poor appetite as well as 10 pound weight loss.  No prior colonoscopy.  No family history of colorectal malignancy.  Does have chronic reflux which is well-controlled omeprazole daily.  No dysphagia odynophagia.  No epigastric or chest pain.   Past Medical History:  Diagnosis Date   Depression    Hyperlipidemia     Past Surgical History:  Procedure Laterality Date   APPENDECTOMY     CESAREAN SECTION     x2    Current Outpatient Medications  Medication Sig Dispense Refill   ALPRAZolam (XANAX) 0.25 MG tablet Take 1 tablet (0.25 mg total) by mouth at bedtime as needed for anxiety. 20 tablet 2   amoxicillin-clavulanate (AUGMENTIN) 875-125 MG tablet Take 1 tablet by mouth every 12 (twelve) hours. 14 tablet 0   Ascorbic  Acid (VITAMIN C) 1000 MG tablet Take 1,000 mg by mouth daily.     atorvastatin (LIPITOR) 20 MG tablet TAKE 1 TABLET BY MOUTH EVERY DAY 90 tablet 1   Calcium Carbonate-Vit D-Min (CALCIUM 1200 PO) Take by mouth.     Cholecalciferol (VITAMIN D3) 1.25 MG (50000 UT) CAPS Take by mouth.     diclofenac (VOLTAREN) 75 MG EC tablet Take 1 tablet (75 mg total) by mouth 2 (two) times daily. 60 tablet 1   dicyclomine (BENTYL) 20 MG tablet Take 1 tablet (20 mg total) by mouth 2 (two) times daily as needed for spasms. 20 tablet 0   mirabegron ER (MYRBETRIQ) 50 MG TB24 tablet Take 1 tablet (50 mg total) by mouth daily. 30 tablet 11   omeprazole (PRILOSEC) 40 MG capsule Take 1 capsule (40 mg total) by mouth daily. 90 capsule 1   ondansetron (ZOFRAN) 4 MG tablet Take 1 tablet (4 mg total) by mouth every 8 (eight) hours as needed for nausea. 12 tablet 0   tolterodine (DETROL LA) 4 MG 24 hr capsule Take 1 capsule (4 mg total) by mouth daily. 90 capsule 3   venlafaxine XR (EFFEXOR-XR) 75 MG 24 hr capsule Take 1 capsule (75 mg total) by mouth daily. 90 capsule 3   zinc gluconate 50 MG tablet Take 50 mg by mouth daily.     No current facility-administered medications for this visit.    Allergies as of 05/31/2023 - Review Complete 05/31/2023  Allergen Reaction Noted  Codeine Nausea And Vomiting 12/06/2015    Family History  Problem Relation Age of Onset   Cancer Father        lung   Healthy Sister    Healthy Daughter    Healthy Son     Social History   Socioeconomic History   Marital status: Married    Spouse name: Molly Maduro   Number of children: 2   Years of education: 18   Highest education level: Master's degree (e.g., MA, MS, MEng, MEd, MSW, MBA)  Occupational History   Occupation: Retired   Tobacco Use   Smoking status: Former    Current packs/day: 0.00    Average packs/day: 1 pack/day for 43.0 years (43.0 ttl pk-yrs)    Types: Cigarettes    Start date: 01/25/1969    Quit date: 01/26/2012     Years since quitting: 11.3   Smokeless tobacco: Never  Vaping Use   Vaping status: Never Used  Substance and Sexual Activity   Alcohol use: Yes    Comment: occassional-glass of wine once a month   Drug use: No   Sexual activity: Yes  Other Topics Concern   Not on file  Social History Narrative   Not on file   Social Drivers of Health   Financial Resource Strain: Low Risk  (05/15/2023)   Overall Financial Resource Strain (CARDIA)    Difficulty of Paying Living Expenses: Not hard at all  Food Insecurity: No Food Insecurity (05/15/2023)   Hunger Vital Sign    Worried About Running Out of Food in the Last Year: Never true    Ran Out of Food in the Last Year: Never true  Transportation Needs: No Transportation Needs (05/15/2023)   PRAPARE - Administrator, Civil Service (Medical): No    Lack of Transportation (Non-Medical): No  Physical Activity: Sufficiently Active (05/15/2023)   Exercise Vital Sign    Days of Exercise per Week: 3 days    Minutes of Exercise per Session: 90 min  Stress: No Stress Concern Present (05/15/2023)   [employer] of Occupational Health - Occupational Stress Questionnaire    Feeling of Stress : Only a little  Social Connections: Moderately Integrated (05/15/2023)   Social Connection and Isolation Panel [NHANES]    Frequency of Communication with Friends and Family: Three times a week    Frequency of Social Gatherings with Friends and Family: Once a week    Attends Religious Services: Never    Database administrator or Organizations: Yes    Attends Banker Meetings: 1 to 4 times per year    Marital Status: Married  Catering manager Violence: Not At Risk (02/12/2020)   Humiliation, Afraid, Rape, and Kick questionnaire    Fear of Current or Ex-Partner: No    Emotionally Abused: No    Physically Abused: No    Sexually Abused: No    Subjective: Review of Systems  Constitutional:  Negative for chills and fever.  HENT:   Negative for congestion and hearing loss.   Eyes:  Negative for blurred vision and double vision.  Respiratory:  Negative for cough and shortness of breath.   Cardiovascular:  Negative for chest pain and palpitations.  Gastrointestinal:  Negative for abdominal pain, blood in stool, constipation, diarrhea, heartburn, melena and vomiting.  Genitourinary:  Negative for dysuria and urgency.  Musculoskeletal:  Negative for joint pain and myalgias.  Skin:  Negative for itching and rash.  Neurological:  Negative for dizziness and headaches.  Psychiatric/Behavioral:  Negative for depression. The patient is not nervous/anxious.        Objective: BP 126/80   Pulse 70   Temp (!) 97.5 F (36.4 C)   Ht 5\' 2"  (1.575 m)   Wt 183 lb 3.2 oz (83.1 kg)   BMI 33.51 kg/m  Physical Exam Constitutional:      Appearance: Normal appearance.  HENT:     Head: Normocephalic and atraumatic.  Eyes:     Extraocular Movements: Extraocular movements intact.     Conjunctiva/sclera: Conjunctivae normal.  Cardiovascular:     Rate and Rhythm: Normal rate and regular rhythm.     Heart sounds: Murmur heard.  Pulmonary:     Effort: Pulmonary effort is normal.     Breath sounds: Normal breath sounds.  Abdominal:     General: Bowel sounds are normal.     Palpations: Abdomen is soft.  Musculoskeletal:        General: No swelling. Normal range of motion.     Cervical back: Normal range of motion and neck supple.  Skin:    General: Skin is warm and dry.     Coloration: Skin is not jaundiced.  Neurological:     General: No focal deficit present.     Mental Status: She is alert and oriented to person, place, and time.  Psychiatric:        Mood and Affect: Mood normal.        Behavior: Behavior normal.      Assessment/Plan:  1.  Colitis, abnormal CT of the colon-continue full course of antibiotics.  Will schedule for colonoscopy to further evaluate. The risks including infection, bleed, or perforation as  well as benefits, limitations, alternatives and imponderables have been reviewed with the patient. Questions have been answered. All parties agreeable.  2.  Chronic GERD-well-controlled omeprazole daily.  Will continue.    3.  Abdominal pain-likely due to above, continue dicyclomine as needed.  4.  Weight loss-colonoscopy as above  Follow-up after colonoscopy  05/31/2023 9:12 AM   Disclaimer: This note was dictated with voice recognition software. Similar sounding words can inadvertently be transcribed and may not be corrected upon review.

## 2023-06-01 MED ORDER — CLENPIQ 10-3.5-12 MG-GM -GM/175ML PO SOLN: 1.0000 | ORAL | 0 refills | Status: DC

## 2023-06-01 NOTE — Telephone Encounter (Signed)
 PA approved via cohere Authorization #[id number] 06/01/2023 - 08/01/2023

## 2023-06-18 ENCOUNTER — Encounter: Payer: Self-pay | Admitting: Internal Medicine

## 2023-06-18 MED ORDER — PEG 3350-KCL-NA BICARB-NACL 420 G PO SOLR: 4000.0000 mL | Freq: Once | ORAL | 0 refills | Status: AC

## 2023-06-18 MED ORDER — ONDANSETRON HCL 4 MG PO TABS: 4.0000 mg | ORAL_TABLET | Freq: Three times a day (TID) | ORAL | 1 refills | Status: DC | PRN

## 2023-06-19 ENCOUNTER — Encounter (HOSPITAL_COMMUNITY): Payer: Self-pay

## 2023-06-19 ENCOUNTER — Encounter (HOSPITAL_COMMUNITY)
Admission: RE | Admit: 2023-06-19 | Discharge: 2023-06-19 | Disposition: A | Discharge status: Home / Self Care | Source: Ambulatory Visit | Attending: Internal Medicine | Admitting: Internal Medicine

## 2023-06-19 ENCOUNTER — Other Ambulatory Visit: Payer: Self-pay

## 2023-06-19 HISTORY — DX: Anxiety disorder, unspecified: F41.9

## 2023-06-19 HISTORY — DX: Gastro-esophageal reflux disease without esophagitis: K21.9

## 2023-06-19 HISTORY — DX: Unspecified osteoarthritis, unspecified site: M19.90

## 2023-06-22 ENCOUNTER — Ambulatory Visit (HOSPITAL_COMMUNITY): Payer: Self-pay | Admitting: Certified Registered Nurse Anesthetist

## 2023-06-22 ENCOUNTER — Encounter (HOSPITAL_COMMUNITY): Payer: Self-pay | Admitting: Internal Medicine

## 2023-06-22 ENCOUNTER — Encounter (HOSPITAL_COMMUNITY)
Admission: RE | Disposition: A | Discharge status: Home / Self Care | Payer: Self-pay | Source: Home / Self Care | Attending: Internal Medicine

## 2023-06-22 ENCOUNTER — Ambulatory Visit (HOSPITAL_COMMUNITY)
Admission: RE | Admit: 2023-06-22 | Discharge: 2023-06-22 | Disposition: A | Discharge status: Home / Self Care | Attending: Internal Medicine | Admitting: Internal Medicine

## 2023-06-22 DIAGNOSIS — I10 Essential (primary) hypertension: Secondary | ICD-10-CM | POA: Insufficient documentation

## 2023-06-22 DIAGNOSIS — Z79899 Other long term (current) drug therapy: Secondary | ICD-10-CM | POA: Diagnosis not present

## 2023-06-22 DIAGNOSIS — Q438 Other specified congenital malformations of intestine: Secondary | ICD-10-CM | POA: Diagnosis not present

## 2023-06-22 DIAGNOSIS — K219 Gastro-esophageal reflux disease without esophagitis: Secondary | ICD-10-CM | POA: Diagnosis not present

## 2023-06-22 DIAGNOSIS — R634 Abnormal weight loss: Secondary | ICD-10-CM | POA: Insufficient documentation

## 2023-06-22 DIAGNOSIS — K529 Noninfective gastroenteritis and colitis, unspecified: Secondary | ICD-10-CM | POA: Insufficient documentation

## 2023-06-22 DIAGNOSIS — R933 Abnormal findings on diagnostic imaging of other parts of digestive tract: Secondary | ICD-10-CM | POA: Insufficient documentation

## 2023-06-22 DIAGNOSIS — Z87891 Personal history of nicotine dependence: Secondary | ICD-10-CM | POA: Diagnosis not present

## 2023-06-22 DIAGNOSIS — K5730 Diverticulosis of large intestine without perforation or abscess without bleeding: Secondary | ICD-10-CM | POA: Diagnosis not present

## 2023-06-22 DIAGNOSIS — Z6833 Body mass index (BMI) 33.0-33.9, adult: Secondary | ICD-10-CM | POA: Insufficient documentation

## 2023-06-22 DIAGNOSIS — F419 Anxiety disorder, unspecified: Secondary | ICD-10-CM | POA: Insufficient documentation

## 2023-06-22 HISTORY — PX: COLONOSCOPY: SHX5424

## 2023-06-22 SURGERY — COLONOSCOPY
Anesthesia: General

## 2023-06-22 MED ORDER — LACTATED RINGERS IV SOLN: INTRAVENOUS | Status: DC

## 2023-06-22 MED ORDER — PHENYLEPHRINE 80 MCG/ML (10ML) SYRINGE FOR IV PUSH (FOR BLOOD PRESSURE SUPPORT)
PREFILLED_SYRINGE | INTRAVENOUS | Status: AC
  Filled 2023-06-22: qty 10

## 2023-06-22 MED ORDER — PHENYLEPHRINE 80 MCG/ML (10ML) SYRINGE FOR IV PUSH (FOR BLOOD PRESSURE SUPPORT)
PREFILLED_SYRINGE | INTRAVENOUS | Status: DC | PRN
  Administered 2023-06-22 (×2): 80 ug via INTRAVENOUS
  Administered 2023-06-22: 160 ug via INTRAVENOUS

## 2023-06-22 MED ORDER — PROPOFOL 500 MG/50ML IV EMUL
INTRAVENOUS | Status: AC
  Filled 2023-06-22: qty 50

## 2023-06-22 MED ORDER — PROPOFOL 500 MG/50ML IV EMUL
INTRAVENOUS | Status: DC | PRN
  Administered 2023-06-22: 150 ug/kg/min via INTRAVENOUS

## 2023-06-22 NOTE — Op Note (Signed)
 [facility] Patient Name: Sabrina Stein Procedure Date: 06/22/2023 10:48 AM MRN: [id number] Date of Birth: May 09, 1950 Attending MD: Hennie Duos. Marletta Lor , [place], [id number] CSN: [id number] Age: 73 Admit Type: Outpatient Procedure:                Colonoscopy Indications:              Abnormal CT of the GI tract Providers:                Hennie Duos. Marletta Lor, DO, Buel Ream. Thomasena Edis RN, RN,                            Pandora Leiter, Technician Referring MD:              Medicines:                See the Anesthesia note for documentation of the                            administered medications Complications:            No immediate complications. Estimated Blood Loss:     Estimated blood loss was minimal. Procedure:                Pre-Anesthesia Assessment:                           - The anesthesia plan was to use monitored                            anesthesia care (MAC).                           After obtaining informed consent, the colonoscope                            was passed under direct vision. Throughout the                            procedure, the patient's blood pressure, pulse, and                            oxygen saturations were monitored continuously. The                            [id number]-HQ190L ([id number]) scope was introduced through                            the anus and advanced to the the cecum, identified                            by appendiceal orifice and ileocecal valve. The                            colonoscopy was technically difficult and complex                            due to  a redundant colon and significant looping.                            Successful completion of the procedure was aided by                            applying abdominal pressure. The patient tolerated                            the procedure well. The quality of the bowel                            preparation was evaluated using the BBPS [facility]                            Bowel  Preparation Scale) with scores of: Right                            Colon = 3, Transverse Colon = 3 and Left Colon = 3                            (entire mucosa seen well with no residual staining,                            small fragments of stool or opaque liquid). The                            total BBPS score equals 9. Scope In: 10:59:18 AM Scope Out: 11:27:10 AM Scope Withdrawal Time: 0 hours 15 minutes 16 seconds  Total Procedure Duration: 0 hours 27 minutes 52 seconds  Findings:      Patchy mild inflammation characterized by erythema was found in the       descending colon and in the transverse colon. 2 small ulcers in the       proximal descending colon Segmental biopsies were taken with a cold       forceps for histology of the entire colon. Rectum and sigmoid colon       appeared spared.      Multiple medium-mouthed and small-mouthed diverticula were found in the       sigmoid colon and descending colon. Impression:               - Patchy mild inflammation was found in the sigmoid                            colon, in the descending colon and in the                            transverse colon secondary to colitis. Biopsied.                           - Diverticulosis in the sigmoid colon and in the  descending colon. Moderate Sedation:      Per Anesthesia Care Recommendation:           - Patient has a contact number available for                            emergencies. The signs and symptoms of potential                            delayed complications were discussed with the                            patient. Return to normal activities tomorrow.                            Written discharge instructions were provided to the                            patient.                           - Resume previous diet.                           - Continue present medications.                           - Await pathology results.                           -  Repeat colonoscopy for surveillance based on                            pathology results.                           - Return to GI clinic in 6 weeks. Procedure Code(s):        --- Professional ---                           [phone], Colonoscopy, flexible; with biopsy, single                            or multiple Diagnosis Code(s):        --- Professional ---                           K52.9, Noninfective gastroenteritis and colitis,                            unspecified                           K57.30, Diverticulosis of large intestine without                            perforation or abscess without bleeding  R93.3, Abnormal findings on diagnostic imaging of                            other parts of digestive tract CPT copyright 2022 American Medical Association. All rights reserved. The codes documented in this report are preliminary and upon coder review may  be revised to meet current compliance requirements. Hennie Duos. Marletta Lor, DO Hennie Duos. Marletta Lor, DO 06/22/2023 11:31:55 AM This report has been signed electronically. Number of Addenda: 0

## 2023-06-22 NOTE — Discharge Instructions (Addendum)
  Colonoscopy Discharge Instructions  Read the instructions outlined below and refer to this sheet in the next few weeks. These discharge instructions provide you with general information on caring for yourself after you leave the hospital. Your doctor may also give you specific instructions. While your treatment has been planned according to the most current medical practices available, unavoidable complications occasionally occur.   ACTIVITY You may resume your regular activity, but move at a slower pace for the next 24 hours.  Take frequent rest periods for the next 24 hours.  Walking will help get rid of the air and reduce the bloated feeling in your belly (abdomen).  No driving for 24 hours (because of the medicine (anesthesia) used during the test).   Do not sign any important legal documents or operate any machinery for 24 hours (because of the anesthesia used during the test).  NUTRITION Drink plenty of fluids.  You may resume your normal diet as instructed by your doctor.  Begin with a light meal and progress to your normal diet. Heavy or fried foods are harder to digest and may make you feel sick to your stomach (nauseated).  Avoid alcoholic beverages for 24 hours or as instructed.  MEDICATIONS You may resume your normal medications unless your doctor tells you otherwise.  WHAT YOU CAN EXPECT TODAY Some feelings of bloating in the abdomen.  Passage of more gas than usual.  Spotting of blood in your stool or on the toilet paper.  IF YOU HAD POLYPS REMOVED DURING THE COLONOSCOPY: No aspirin products for 7 days or as instructed.  No alcohol for 7 days or as instructed.  Eat a soft diet for the next 24 hours.  FINDING OUT THE RESULTS OF YOUR TEST Not all test results are available during your visit. If your test results are not back during the visit, make an appointment with your caregiver to find out the results. Do not assume everything is normal if you have not heard from your  caregiver or the medical facility. It is important for you to follow up on all of your test results.  SEEK IMMEDIATE MEDICAL ATTENTION IF: You have more than a spotting of blood in your stool.  Your belly is swollen (abdominal distention).  You are nauseated or vomiting.  You have a temperature over 101.  You have abdominal pain or discomfort that is severe or gets worse throughout the day.    Your colonoscopy revealed inflammation in the middle portion of your colon.  I took samples of your entire colon.  I did not find any polyps or evidence of colon cancer.  Await pathology results, my office will contact you to determine next steps.  You do have diverticulosis. I would recommend increasing fiber in your diet or adding OTC Benefiber/Metamucil. Be sure to drink at least 4 to 6 glasses of water daily. Follow-up with GI in 6 weeks.   I hope you have a great rest of your week!  Hennie Duos. Marletta Lor, D.O. Gastroenterology and Hepatology [facility]

## 2023-06-22 NOTE — Anesthesia Preprocedure Evaluation (Signed)
 Anesthesia Evaluation  Patient identified by MRN, date of birth, ID band Patient awake    Reviewed: Allergy & Precautions, H&P , NPO status , Patient's Chart, lab work & pertinent test results, reviewed documented beta blocker date and time   Airway Mallampati: II  TM Distance: >3 FB Neck ROM: full    Dental no notable dental hx.    Pulmonary neg pulmonary ROS, former smoker   Pulmonary exam normal breath sounds clear to auscultation       Cardiovascular Exercise Tolerance: Good hypertension, negative cardio ROS  Rhythm:regular Rate:Normal     Neuro/Psych  PSYCHIATRIC DISORDERS Anxiety Depression    negative neurological ROS     GI/Hepatic Neg liver ROS,GERD  ,,  Endo/Other  negative endocrine ROS    Renal/GU negative Renal ROS  negative genitourinary   Musculoskeletal   Abdominal   Peds  Hematology negative hematology ROS (+)   Anesthesia Other Findings   Reproductive/Obstetrics negative OB ROS                             Anesthesia Physical Anesthesia Plan  ASA: 2  Anesthesia Plan: General   Post-op Pain Management:    Induction:   PONV Risk Score and Plan: Propofol infusion  Airway Management Planned:   Additional Equipment:   Intra-op Plan:   Post-operative Plan:   Informed Consent: I have reviewed the patients History and Physical, chart, labs and discussed the procedure including the risks, benefits and alternatives for the proposed anesthesia with the patient or authorized representative who has indicated his/her understanding and acceptance.     Dental Advisory Given  Plan Discussed with: CRNA  Anesthesia Plan Comments:        Anesthesia Quick Evaluation

## 2023-06-22 NOTE — Interval H&P Note (Signed)
 History and Physical Interval Note:  06/22/2023 10:47 AM  Sabrina Stein  has presented today for surgery, with the diagnosis of ABNORMAL COLON ON CT.  The various methods of treatment have been discussed with the patient and family. After consideration of risks, benefits and other options for treatment, the patient has consented to  Procedure(s) with comments: COLONOSCOPY (N/A) - 130PM, OK RM 1 as a surgical intervention.  The patient's history has been reviewed, patient examined, no change in status, stable for surgery.  I have reviewed the patient's chart and labs.  Questions were answered to the patient's satisfaction.     Lanelle Bal

## 2023-06-22 NOTE — Transfer of Care (Signed)
 Immediate Anesthesia Transfer of Care Note  Patient: Sabrina Stein  Procedure(s) Performed: COLONOSCOPY  Patient Location: Short Stay  Anesthesia Type:General  Level of Consciousness: awake, alert , oriented, and patient cooperative  Airway & Oxygen Therapy: Patient Spontanous Breathing  Post-op Assessment: Report given to RN, Post -op Vital signs reviewed and stable, and Patient moving all extremities X 4  Post vital signs: Reviewed and stable  Last Vitals:  Vitals Value Taken Time  BP 123/50 06/22/23 1132  Temp 36.4 C 06/22/23 1132  Pulse 69 06/22/23 1132  Resp 15 06/22/23 1132  SpO2 95 % 06/22/23 1132    Last Pain:  Vitals:   06/22/23 1132  TempSrc: Oral  PainSc: 0-No pain         Complications: No notable events documented.

## 2023-06-22 NOTE — Anesthesia Postprocedure Evaluation (Signed)
 Anesthesia Post Note  Patient: Sabrina Stein  Procedure(s) Performed: COLONOSCOPY  Patient location during evaluation: Phase II Anesthesia Type: General Level of consciousness: awake Pain management: pain level controlled Vital Signs Assessment: post-procedure vital signs reviewed and stable Respiratory status: spontaneous breathing and respiratory function stable Cardiovascular status: blood pressure returned to baseline and stable Postop Assessment: no headache and no apparent nausea or vomiting Anesthetic complications: no Comments: Late entry   No notable events documented.   Last Vitals:  Vitals:   06/22/23 1037 06/22/23 1132  BP: 135/74 (!) 123/50  Pulse: 82 69  Resp: 14 15  Temp: 36.7 C 36.4 C  SpO2: 95% 95%    Last Pain:  Vitals:   06/22/23 1132  TempSrc: Oral  PainSc: 0-No pain                 Windell Norfolk

## 2023-06-25 ENCOUNTER — Encounter (HOSPITAL_COMMUNITY): Payer: Self-pay | Admitting: Internal Medicine

## 2023-06-25 ENCOUNTER — Encounter: Payer: Self-pay | Admitting: Internal Medicine

## 2023-06-25 LAB — SURGICAL PATHOLOGY

## 2023-06-27 ENCOUNTER — Encounter: Payer: Self-pay | Admitting: Internal Medicine

## 2023-06-27 ENCOUNTER — Other Ambulatory Visit: Payer: Self-pay | Admitting: Internal Medicine

## 2023-06-27 MED ORDER — DICYCLOMINE HCL 20 MG PO TABS: 20.0000 mg | ORAL_TABLET | Freq: Two times a day (BID) | ORAL | 11 refills | Status: DC

## 2023-06-29 ENCOUNTER — Other Ambulatory Visit: Payer: Self-pay | Admitting: Internal Medicine

## 2023-06-29 ENCOUNTER — Other Ambulatory Visit: Payer: Self-pay

## 2023-06-29 DIAGNOSIS — K529 Noninfective gastroenteritis and colitis, unspecified: Secondary | ICD-10-CM

## 2023-06-29 MED ORDER — CIPROFLOXACIN HCL 500 MG PO TABS: 500.0000 mg | ORAL_TABLET | Freq: Two times a day (BID) | ORAL | 0 refills | Status: AC

## 2023-06-29 MED ORDER — METRONIDAZOLE 500 MG PO TABS: 500.0000 mg | ORAL_TABLET | Freq: Two times a day (BID) | ORAL | 0 refills | Status: DC

## 2023-07-02 ENCOUNTER — Inpatient Hospital Stay (HOSPITAL_COMMUNITY)

## 2023-07-02 ENCOUNTER — Inpatient Hospital Stay (HOSPITAL_COMMUNITY)
Admission: EM | Admit: 2023-07-02 | Discharge: 2023-07-26 | DRG: 208 | Disposition: E | Attending: Pulmonary Disease | Admitting: Pulmonary Disease

## 2023-07-02 ENCOUNTER — Other Ambulatory Visit (HOSPITAL_COMMUNITY): Payer: Self-pay | Admitting: *Deleted

## 2023-07-02 ENCOUNTER — Encounter (HOSPITAL_COMMUNITY): Payer: Self-pay

## 2023-07-02 ENCOUNTER — Emergency Department (HOSPITAL_COMMUNITY)

## 2023-07-02 ENCOUNTER — Other Ambulatory Visit: Payer: Self-pay

## 2023-07-02 ENCOUNTER — Telehealth: Payer: Self-pay | Admitting: Family Medicine

## 2023-07-02 DIAGNOSIS — I130 Hypertensive heart and chronic kidney disease with heart failure and stage 1 through stage 4 chronic kidney disease, or unspecified chronic kidney disease: Secondary | ICD-10-CM | POA: Diagnosis present

## 2023-07-02 DIAGNOSIS — Z66 Do not resuscitate: Secondary | ICD-10-CM | POA: Diagnosis not present

## 2023-07-02 DIAGNOSIS — I110 Hypertensive heart disease with heart failure: Secondary | ICD-10-CM | POA: Diagnosis not present

## 2023-07-02 DIAGNOSIS — I953 Hypotension of hemodialysis: Secondary | ICD-10-CM | POA: Diagnosis not present

## 2023-07-02 DIAGNOSIS — N179 Acute kidney failure, unspecified: Secondary | ICD-10-CM | POA: Diagnosis present

## 2023-07-02 DIAGNOSIS — F419 Anxiety disorder, unspecified: Secondary | ICD-10-CM | POA: Diagnosis present

## 2023-07-02 DIAGNOSIS — R42 Dizziness and giddiness: Secondary | ICD-10-CM | POA: Diagnosis not present

## 2023-07-02 DIAGNOSIS — R1111 Vomiting without nausea: Secondary | ICD-10-CM | POA: Diagnosis not present

## 2023-07-02 DIAGNOSIS — D509 Iron deficiency anemia, unspecified: Secondary | ICD-10-CM | POA: Diagnosis not present

## 2023-07-02 DIAGNOSIS — I700 Atherosclerosis of aorta: Secondary | ICD-10-CM | POA: Diagnosis not present

## 2023-07-02 DIAGNOSIS — Z515 Encounter for palliative care: Secondary | ICD-10-CM | POA: Diagnosis not present

## 2023-07-02 DIAGNOSIS — D500 Iron deficiency anemia secondary to blood loss (chronic): Secondary | ICD-10-CM

## 2023-07-02 DIAGNOSIS — I10 Essential (primary) hypertension: Secondary | ICD-10-CM

## 2023-07-02 DIAGNOSIS — I6782 Cerebral ischemia: Secondary | ICD-10-CM | POA: Diagnosis not present

## 2023-07-02 DIAGNOSIS — J690 Pneumonitis due to inhalation of food and vomit: Secondary | ICD-10-CM | POA: Diagnosis present

## 2023-07-02 DIAGNOSIS — I959 Hypotension, unspecified: Secondary | ICD-10-CM | POA: Diagnosis not present

## 2023-07-02 DIAGNOSIS — I509 Heart failure, unspecified: Secondary | ICD-10-CM | POA: Diagnosis not present

## 2023-07-02 DIAGNOSIS — E861 Hypovolemia: Secondary | ICD-10-CM | POA: Diagnosis present

## 2023-07-02 DIAGNOSIS — J810 Acute pulmonary edema: Secondary | ICD-10-CM | POA: Diagnosis not present

## 2023-07-02 DIAGNOSIS — R110 Nausea: Secondary | ICD-10-CM | POA: Diagnosis not present

## 2023-07-02 DIAGNOSIS — E875 Hyperkalemia: Secondary | ICD-10-CM | POA: Diagnosis present

## 2023-07-02 DIAGNOSIS — A419 Sepsis, unspecified organism: Secondary | ICD-10-CM | POA: Diagnosis not present

## 2023-07-02 DIAGNOSIS — I5020 Unspecified systolic (congestive) heart failure: Principal | ICD-10-CM

## 2023-07-02 DIAGNOSIS — F32A Depression, unspecified: Secondary | ICD-10-CM | POA: Diagnosis present

## 2023-07-02 DIAGNOSIS — J189 Pneumonia, unspecified organism: Secondary | ICD-10-CM | POA: Diagnosis not present

## 2023-07-02 DIAGNOSIS — I2489 Other forms of acute ischemic heart disease: Secondary | ICD-10-CM | POA: Diagnosis present

## 2023-07-02 DIAGNOSIS — R7989 Other specified abnormal findings of blood chemistry: Secondary | ICD-10-CM | POA: Diagnosis not present

## 2023-07-02 DIAGNOSIS — R109 Unspecified abdominal pain: Secondary | ICD-10-CM | POA: Diagnosis not present

## 2023-07-02 DIAGNOSIS — Z8719 Personal history of other diseases of the digestive system: Secondary | ICD-10-CM | POA: Diagnosis not present

## 2023-07-02 DIAGNOSIS — Z6833 Body mass index (BMI) 33.0-33.9, adult: Secondary | ICD-10-CM

## 2023-07-02 DIAGNOSIS — D62 Acute posthemorrhagic anemia: Secondary | ICD-10-CM | POA: Diagnosis present

## 2023-07-02 DIAGNOSIS — R569 Unspecified convulsions: Secondary | ICD-10-CM | POA: Diagnosis not present

## 2023-07-02 DIAGNOSIS — Z48813 Encounter for surgical aftercare following surgery on the respiratory system: Secondary | ICD-10-CM | POA: Diagnosis not present

## 2023-07-02 DIAGNOSIS — Z992 Dependence on renal dialysis: Secondary | ICD-10-CM | POA: Diagnosis not present

## 2023-07-02 DIAGNOSIS — J849 Interstitial pulmonary disease, unspecified: Secondary | ICD-10-CM | POA: Diagnosis present

## 2023-07-02 DIAGNOSIS — R0609 Other forms of dyspnea: Secondary | ICD-10-CM

## 2023-07-02 DIAGNOSIS — J9690 Respiratory failure, unspecified, unspecified whether with hypoxia or hypercapnia: Secondary | ICD-10-CM | POA: Diagnosis not present

## 2023-07-02 DIAGNOSIS — K567 Ileus, unspecified: Secondary | ICD-10-CM | POA: Diagnosis not present

## 2023-07-02 DIAGNOSIS — F1090 Alcohol use, unspecified, uncomplicated: Secondary | ICD-10-CM | POA: Diagnosis not present

## 2023-07-02 DIAGNOSIS — R0902 Hypoxemia: Secondary | ICD-10-CM | POA: Diagnosis not present

## 2023-07-02 DIAGNOSIS — Z79899 Other long term (current) drug therapy: Secondary | ICD-10-CM

## 2023-07-02 DIAGNOSIS — E785 Hyperlipidemia, unspecified: Secondary | ICD-10-CM | POA: Diagnosis not present

## 2023-07-02 DIAGNOSIS — R6521 Severe sepsis with septic shock: Secondary | ICD-10-CM | POA: Diagnosis not present

## 2023-07-02 DIAGNOSIS — E8729 Other acidosis: Secondary | ICD-10-CM | POA: Diagnosis not present

## 2023-07-02 DIAGNOSIS — R918 Other nonspecific abnormal finding of lung field: Secondary | ICD-10-CM | POA: Diagnosis not present

## 2023-07-02 DIAGNOSIS — R4182 Altered mental status, unspecified: Secondary | ICD-10-CM | POA: Diagnosis not present

## 2023-07-02 DIAGNOSIS — G9340 Encephalopathy, unspecified: Secondary | ICD-10-CM | POA: Diagnosis not present

## 2023-07-02 DIAGNOSIS — R1084 Generalized abdominal pain: Secondary | ICD-10-CM | POA: Diagnosis not present

## 2023-07-02 DIAGNOSIS — E43 Unspecified severe protein-calorie malnutrition: Secondary | ICD-10-CM | POA: Diagnosis not present

## 2023-07-02 DIAGNOSIS — K5730 Diverticulosis of large intestine without perforation or abscess without bleeding: Secondary | ICD-10-CM | POA: Diagnosis present

## 2023-07-02 DIAGNOSIS — Z87891 Personal history of nicotine dependence: Secondary | ICD-10-CM

## 2023-07-02 DIAGNOSIS — R6520 Severe sepsis without septic shock: Secondary | ICD-10-CM | POA: Diagnosis not present

## 2023-07-02 DIAGNOSIS — R680 Hypothermia, not associated with low environmental temperature: Secondary | ICD-10-CM | POA: Diagnosis present

## 2023-07-02 DIAGNOSIS — K529 Noninfective gastroenteritis and colitis, unspecified: Secondary | ICD-10-CM | POA: Diagnosis not present

## 2023-07-02 DIAGNOSIS — J9601 Acute respiratory failure with hypoxia: Secondary | ICD-10-CM | POA: Diagnosis not present

## 2023-07-02 DIAGNOSIS — K219 Gastro-esophageal reflux disease without esophagitis: Secondary | ICD-10-CM | POA: Diagnosis present

## 2023-07-02 DIAGNOSIS — N170 Acute kidney failure with tubular necrosis: Secondary | ICD-10-CM | POA: Diagnosis not present

## 2023-07-02 DIAGNOSIS — R5383 Other fatigue: Secondary | ICD-10-CM | POA: Diagnosis not present

## 2023-07-02 DIAGNOSIS — G9341 Metabolic encephalopathy: Secondary | ICD-10-CM | POA: Diagnosis not present

## 2023-07-02 DIAGNOSIS — K828 Other specified diseases of gallbladder: Secondary | ICD-10-CM | POA: Diagnosis not present

## 2023-07-02 DIAGNOSIS — Z9911 Dependence on respirator [ventilator] status: Secondary | ICD-10-CM

## 2023-07-02 DIAGNOSIS — R112 Nausea with vomiting, unspecified: Secondary | ICD-10-CM | POA: Diagnosis not present

## 2023-07-02 DIAGNOSIS — Z4682 Encounter for fitting and adjustment of non-vascular catheter: Secondary | ICD-10-CM | POA: Diagnosis not present

## 2023-07-02 DIAGNOSIS — D649 Anemia, unspecified: Secondary | ICD-10-CM | POA: Diagnosis present

## 2023-07-02 DIAGNOSIS — R531 Weakness: Secondary | ICD-10-CM | POA: Diagnosis not present

## 2023-07-02 DIAGNOSIS — I129 Hypertensive chronic kidney disease with stage 1 through stage 4 chronic kidney disease, or unspecified chronic kidney disease: Secondary | ICD-10-CM | POA: Diagnosis not present

## 2023-07-02 DIAGNOSIS — N1831 Chronic kidney disease, stage 3a: Secondary | ICD-10-CM | POA: Diagnosis present

## 2023-07-02 DIAGNOSIS — Z791 Long term (current) use of non-steroidal anti-inflammatories (NSAID): Secondary | ICD-10-CM

## 2023-07-02 DIAGNOSIS — R7401 Elevation of levels of liver transaminase levels: Secondary | ICD-10-CM | POA: Diagnosis not present

## 2023-07-02 DIAGNOSIS — J984 Other disorders of lung: Secondary | ICD-10-CM | POA: Diagnosis not present

## 2023-07-02 DIAGNOSIS — R32 Unspecified urinary incontinence: Secondary | ICD-10-CM | POA: Diagnosis present

## 2023-07-02 DIAGNOSIS — Z801 Family history of malignant neoplasm of trachea, bronchus and lung: Secondary | ICD-10-CM

## 2023-07-02 DIAGNOSIS — E669 Obesity, unspecified: Secondary | ICD-10-CM | POA: Diagnosis present

## 2023-07-02 DIAGNOSIS — Z885 Allergy status to narcotic agent status: Secondary | ICD-10-CM

## 2023-07-02 DIAGNOSIS — R6881 Early satiety: Secondary | ICD-10-CM | POA: Diagnosis not present

## 2023-07-02 DIAGNOSIS — R1114 Bilious vomiting: Secondary | ICD-10-CM | POA: Diagnosis not present

## 2023-07-02 DIAGNOSIS — Z452 Encounter for adjustment and management of vascular access device: Secondary | ICD-10-CM | POA: Diagnosis not present

## 2023-07-02 DIAGNOSIS — R634 Abnormal weight loss: Secondary | ICD-10-CM | POA: Diagnosis not present

## 2023-07-02 DIAGNOSIS — R140 Abdominal distension (gaseous): Secondary | ICD-10-CM | POA: Diagnosis not present

## 2023-07-02 DIAGNOSIS — I517 Cardiomegaly: Secondary | ICD-10-CM | POA: Diagnosis not present

## 2023-07-02 DIAGNOSIS — K838 Other specified diseases of biliary tract: Secondary | ICD-10-CM | POA: Diagnosis not present

## 2023-07-02 DIAGNOSIS — R1110 Vomiting, unspecified: Secondary | ICD-10-CM | POA: Diagnosis not present

## 2023-07-02 DIAGNOSIS — L8990 Pressure ulcer of unspecified site, unspecified stage: Secondary | ICD-10-CM | POA: Insufficient documentation

## 2023-07-02 DIAGNOSIS — R630 Anorexia: Secondary | ICD-10-CM | POA: Diagnosis not present

## 2023-07-02 DIAGNOSIS — D72829 Elevated white blood cell count, unspecified: Secondary | ICD-10-CM | POA: Diagnosis not present

## 2023-07-02 DIAGNOSIS — R0602 Shortness of breath: Secondary | ICD-10-CM | POA: Diagnosis not present

## 2023-07-02 LAB — FOLATE: Folate: 9.8 ng/mL (ref >5.9)

## 2023-07-02 LAB — LACTIC ACID, PLASMA: Lactic Acid, Venous: 2 mmol/L (ref 0.5–1.9)

## 2023-07-02 LAB — CBC WITH DIFFERENTIAL/PLATELET
Abs Immature Granulocytes: 0.29 10*3/uL — ABNORMAL HIGH (ref 0.00–0.07)
Basophils Absolute: 0 10*3/uL (ref 0.0–0.1)
Basophils Relative: 0 %
Eosinophils Absolute: 0 10*3/uL (ref 0.0–0.5)
Eosinophils Relative: 0 %
HCT: 18.8 % — ABNORMAL LOW (ref 36.0–46.0)
Hemoglobin: 6 g/dL — CL (ref 12.0–15.0)
Immature Granulocytes: 2 %
Lymphocytes Relative: 7 %
Lymphs Abs: 1.1 10*3/uL (ref 0.7–4.0)
MCH: 30 pg (ref 26.0–34.0)
MCHC: 31.9 g/dL (ref 30.0–36.0)
MCV: 94 fL (ref 80.0–100.0)
Monocytes Absolute: 0.9 10*3/uL (ref 0.1–1.0)
Monocytes Relative: 6 %
Neutro Abs: 13.4 10*3/uL — ABNORMAL HIGH (ref 1.7–7.7)
Neutrophils Relative %: 85 %
Platelets: 528 10*3/uL — ABNORMAL HIGH (ref 150–400)
RBC: 2 MIL/uL — ABNORMAL LOW (ref 3.87–5.11)
RDW: 14.4 % (ref 11.5–15.5)
WBC: 15.8 10*3/uL — ABNORMAL HIGH (ref 4.0–10.5)
nRBC: 0.3 % — ABNORMAL HIGH (ref 0.0–0.2)

## 2023-07-02 LAB — POC OCCULT BLOOD, ED: Fecal Occult Bld: NEGATIVE

## 2023-07-02 LAB — VITAMIN B12: Vitamin B-12: 235 pg/mL (ref 180–914)

## 2023-07-02 LAB — ECHOCARDIOGRAM COMPLETE
AR max vel: 1.78 cm2
AV Area VTI: 1.68 cm2
AV Area mean vel: 1.8 cm2
AV Mean grad: 10 mmHg
AV Peak grad: 21.6 mmHg
Ao pk vel: 2.33 m/s
Area-P 1/2: 2.07 cm2
Height: 62 in
MV VTI: 1.47 cm2
P 1/2 time: 602 ms
S' Lateral: 1.8 cm
Weight: 2927.71 [oz_av]

## 2023-07-02 LAB — D-DIMER, QUANTITATIVE: D-Dimer, Quant: 5.1 ug{FEU}/mL — ABNORMAL HIGH (ref 0.00–0.50)

## 2023-07-02 LAB — PROCALCITONIN: Procalcitonin: 3.44 ng/mL

## 2023-07-02 LAB — COMPREHENSIVE METABOLIC PANEL WITH GFR
ALT: 72 U/L — ABNORMAL HIGH (ref 0–44)
AST: 149 U/L — ABNORMAL HIGH (ref 15–41)
Albumin: 2.2 g/dL — ABNORMAL LOW (ref 3.5–5.0)
Alkaline Phosphatase: 169 U/L — ABNORMAL HIGH (ref 38–126)
Anion gap: 18 — ABNORMAL HIGH (ref 5–15)
BUN: 45 mg/dL — ABNORMAL HIGH (ref 8–23)
CO2: 17 mmol/L — ABNORMAL LOW (ref 22–32)
Calcium: 8.6 mg/dL — ABNORMAL LOW (ref 8.9–10.3)
Chloride: 99 mmol/L (ref 98–111)
Creatinine, Ser: 3.09 mg/dL — ABNORMAL HIGH (ref 0.44–1.00)
GFR, Estimated: 15 mL/min — ABNORMAL LOW (ref >60)
Glucose, Bld: 152 mg/dL — ABNORMAL HIGH (ref 70–99)
Potassium: 4 mmol/L (ref 3.5–5.1)
Sodium: 134 mmol/L — ABNORMAL LOW (ref 135–145)
Total Bilirubin: 0.9 mg/dL (ref 0.0–1.2)
Total Protein: 7 g/dL (ref 6.5–8.1)

## 2023-07-02 LAB — ABO/RH: ABO/RH(D): O POS

## 2023-07-02 LAB — TROPONIN I (HIGH SENSITIVITY)
Troponin I (High Sensitivity): 230 ng/L (ref <18)
Troponin I (High Sensitivity): 214 ng/L (ref <18)

## 2023-07-02 LAB — RETICULOCYTES
Immature Retic Fract: 37.1 % — ABNORMAL HIGH (ref 2.3–15.9)
RBC.: 2.13 MIL/uL — ABNORMAL LOW (ref 3.87–5.11)
Retic Count, Absolute: 62.8 10*3/uL (ref 19.0–186.0)
Retic Ct Pct: 3 % (ref 0.4–3.1)

## 2023-07-02 LAB — IRON AND TIBC
Iron: 54 ug/dL (ref 28–170)
Saturation Ratios: 27 % (ref 10.4–31.8)
TIBC: 203 ug/dL — ABNORMAL LOW (ref 250–450)
UIBC: 149 ug/dL

## 2023-07-02 LAB — MRSA NEXT GEN BY PCR, NASAL: MRSA by PCR Next Gen: NOT DETECTED

## 2023-07-02 LAB — BRAIN NATRIURETIC PEPTIDE: B Natriuretic Peptide: 1141 pg/mL — ABNORMAL HIGH (ref 0.0–100.0)

## 2023-07-02 LAB — HEMOGLOBIN A1C
Hgb A1c MFr Bld: 5.8 % — ABNORMAL HIGH (ref 4.8–5.6)
Mean Plasma Glucose: 119.76 mg/dL

## 2023-07-02 LAB — PREPARE RBC (CROSSMATCH)

## 2023-07-02 LAB — HEMOGLOBIN AND HEMATOCRIT, BLOOD
HCT: 19.6 % — ABNORMAL LOW (ref 36.0–46.0)
Hemoglobin: 6.3 g/dL — CL (ref 12.0–15.0)

## 2023-07-02 LAB — FERRITIN: Ferritin: 1203 ng/mL — ABNORMAL HIGH (ref 11–307)

## 2023-07-02 MED ORDER — POLYETHYLENE GLYCOL 3350 17 G PO PACK: 17.0000 g | PACK | Freq: Every day | ORAL | Status: DC | PRN

## 2023-07-02 MED ORDER — ACETAMINOPHEN 650 MG RE SUPP: 650.0000 mg | Freq: Four times a day (QID) | RECTAL | Status: DC | PRN

## 2023-07-02 MED ORDER — ONDANSETRON HCL 4 MG PO TABS: 4.0000 mg | ORAL_TABLET | Freq: Four times a day (QID) | ORAL | Status: DC | PRN

## 2023-07-02 MED ORDER — VENLAFAXINE HCL ER 75 MG PO CP24
75.0000 mg | ORAL_CAPSULE | Freq: Every day | ORAL | Status: DC
  Administered 2023-07-03 – 2023-07-07 (×5): 75 mg via ORAL
  Filled 2023-07-02 (×5): qty 1

## 2023-07-02 MED ORDER — PROCHLORPERAZINE 25 MG RE SUPP
25.0000 mg | Freq: Once | RECTAL | Status: AC
  Administered 2023-07-02: 25 mg via RECTAL
  Filled 2023-07-02: qty 1

## 2023-07-02 MED ORDER — ACETAMINOPHEN 325 MG PO TABS
650.0000 mg | ORAL_TABLET | Freq: Four times a day (QID) | ORAL | Status: DC | PRN
  Administered 2023-07-02 – 2023-07-06 (×6): 650 mg via ORAL
  Filled 2023-07-02 (×6): qty 2

## 2023-07-02 MED ORDER — HYDRALAZINE HCL 20 MG/ML IJ SOLN
10.0000 mg | Freq: Four times a day (QID) | INTRAMUSCULAR | Status: DC | PRN
  Administered 2023-07-04: 10 mg via INTRAVENOUS
  Filled 2023-07-02: qty 1

## 2023-07-02 MED ORDER — BISACODYL 10 MG RE SUPP: 10.0000 mg | Freq: Every day | RECTAL | Status: DC | PRN

## 2023-07-02 MED ORDER — MIRABEGRON ER 25 MG PO TB24
50.0000 mg | ORAL_TABLET | Freq: Every day | ORAL | Status: DC
  Administered 2023-07-03 – 2023-07-06 (×4): 50 mg via ORAL
  Filled 2023-07-02 (×5): qty 2

## 2023-07-02 MED ORDER — PANTOPRAZOLE SODIUM 40 MG IV SOLR
40.0000 mg | Freq: Two times a day (BID) | INTRAVENOUS | Status: DC
  Administered 2023-07-02 – 2023-07-13 (×24): 40 mg via INTRAVENOUS
  Filled 2023-07-02 (×24): qty 10

## 2023-07-02 MED ORDER — TRAZODONE HCL 50 MG PO TABS
50.0000 mg | ORAL_TABLET | Freq: Every evening | ORAL | Status: DC | PRN
  Administered 2023-07-04: 50 mg via ORAL
  Filled 2023-07-02: qty 1

## 2023-07-02 MED ORDER — METRONIDAZOLE 500 MG PO TABS: 500.0000 mg | ORAL_TABLET | Freq: Two times a day (BID) | ORAL | Status: DC

## 2023-07-02 MED ORDER — SODIUM CHLORIDE 0.9% IV SOLUTION: Freq: Once | INTRAVENOUS | Status: AC

## 2023-07-02 MED ORDER — CIPROFLOXACIN HCL 250 MG PO TABS: 500.0000 mg | ORAL_TABLET | Freq: Two times a day (BID) | ORAL | Status: DC

## 2023-07-02 MED ORDER — SODIUM CHLORIDE 0.9 % IV SOLN: INTRAVENOUS | Status: AC | PRN

## 2023-07-02 MED ORDER — DOXYCYCLINE HYCLATE 100 MG PO TABS
100.0000 mg | ORAL_TABLET | Freq: Two times a day (BID) | ORAL | Status: DC
  Administered 2023-07-02 – 2023-07-06 (×8): 100 mg via ORAL
  Filled 2023-07-02 (×8): qty 1

## 2023-07-02 MED ORDER — CHLORHEXIDINE GLUCONATE CLOTH 2 % EX PADS
6.0000 | MEDICATED_PAD | Freq: Every day | CUTANEOUS | Status: DC
  Administered 2023-07-03 – 2023-07-13 (×10): 6 via TOPICAL

## 2023-07-02 MED ORDER — FESOTERODINE FUMARATE ER 4 MG PO TB24
4.0000 mg | ORAL_TABLET | Freq: Every day | ORAL | Status: DC
  Administered 2023-07-03 – 2023-07-05 (×3): 4 mg via ORAL
  Filled 2023-07-02 (×4): qty 1

## 2023-07-02 MED ORDER — PIPERACILLIN-TAZOBACTAM 3.375 G IVPB 30 MIN
3.3750 g | Freq: Once | INTRAVENOUS | Status: AC
Start: 2023-07-02 — End: 2023-07-02
  Administered 2023-07-02: 3.375 g via INTRAVENOUS
  Filled 2023-07-02: qty 50

## 2023-07-02 MED ORDER — ONDANSETRON HCL 4 MG/2ML IJ SOLN
4.0000 mg | Freq: Once | INTRAMUSCULAR | Status: AC
  Administered 2023-07-02: 4 mg via INTRAVENOUS
  Filled 2023-07-02: qty 2

## 2023-07-02 MED ORDER — ONDANSETRON HCL 4 MG/2ML IJ SOLN: 4.0000 mg | Freq: Four times a day (QID) | INTRAMUSCULAR | Status: DC | PRN

## 2023-07-02 MED ORDER — SODIUM CHLORIDE 0.9% FLUSH
3.0000 mL | INTRAVENOUS | Status: DC | PRN
  Administered 2023-07-04: 3 mL via INTRAVENOUS

## 2023-07-02 MED ORDER — SODIUM CHLORIDE 0.9 % IV SOLN
3.0000 g | Freq: Two times a day (BID) | INTRAVENOUS | Status: DC
  Administered 2023-07-02 – 2023-07-06 (×8): 3 g via INTRAVENOUS
  Filled 2023-07-02 (×13): qty 8

## 2023-07-02 MED ORDER — SODIUM CHLORIDE 0.9% FLUSH
3.0000 mL | Freq: Two times a day (BID) | INTRAVENOUS | Status: DC
  Administered 2023-07-02 – 2023-07-13 (×20): 3 mL via INTRAVENOUS

## 2023-07-02 MED ORDER — ALPRAZOLAM 0.25 MG PO TABS
0.2500 mg | ORAL_TABLET | Freq: Every evening | ORAL | Status: DC | PRN
  Administered 2023-07-03 – 2023-07-05 (×3): 0.25 mg via ORAL
  Filled 2023-07-02 (×3): qty 1

## 2023-07-02 MED ORDER — FUROSEMIDE 10 MG/ML IJ SOLN
40.0000 mg | Freq: Two times a day (BID) | INTRAMUSCULAR | Status: DC
  Administered 2023-07-02 – 2023-07-03 (×2): 40 mg via INTRAVENOUS
  Filled 2023-07-02 (×2): qty 4

## 2023-07-02 MED ORDER — SODIUM CHLORIDE 0.9 % IV BOLUS
1000.0000 mL | Freq: Once | INTRAVENOUS | Status: AC
  Administered 2023-07-02: 1000 mL via INTRAVENOUS

## 2023-07-02 MED ORDER — SODIUM CHLORIDE 0.9% FLUSH
3.0000 mL | Freq: Two times a day (BID) | INTRAVENOUS | Status: DC
  Administered 2023-07-02 – 2023-07-05 (×6): 3 mL via INTRAVENOUS

## 2023-07-02 MED ORDER — PANTOPRAZOLE SODIUM 40 MG IV SOLR: 40.0000 mg | Freq: Once | INTRAVENOUS | Status: DC

## 2023-07-02 MED ORDER — ATORVASTATIN CALCIUM 10 MG PO TABS
20.0000 mg | ORAL_TABLET | Freq: Every day | ORAL | Status: DC
  Administered 2023-07-03 – 2023-07-07 (×5): 20 mg via ORAL
  Filled 2023-07-02 (×5): qty 2

## 2023-07-02 NOTE — Progress Notes (Signed)
 Pharmacy Antibiotic Note  Sabrina Stein is a 73 y.o. female admitted on 07/02/2023 with  weakness, nausea, and vomiting . PMH significant for GERD, anxiety, arthritis, depression, hyperlipidemia. On presentation, patient was hypoxic to 70s, imaging of the lungs suggestive of pneumonia. Of note, patient has recent history of colitis (seen on colonoscopy from 06/22/23) and started courses of ciprofloxacin and metronidazole on Friday (06/29/23). Patient is currently afebrile with WBC of 15.8. Pharmacy has been consulted for Unasyn dosing.  Plan: Start Unasyn 3 g IV every 12 hours Continue doxycycline 100 mg BID per MD Monitor renal function, clinical status, culture data, and LOT  Height: 5\' 2"  (157.5 cm) Weight: 83 kg (182 lb 15.7 oz) IBW/kg (Calculated) : 50.1  Temp (24hrs), Avg:96.8 F (36 C), Min:94.5 F (34.7 C), Max:97.9 F (36.6 C)  Recent Labs  Lab 07/02/23 1333 07/02/23 1436  WBC 15.8*  --   CREATININE 3.09*  --   LATICACIDVEN  --  2.0*    Estimated Creatinine Clearance: 16.4 mL/min (A) (by C-G formula based on SCr of 3.09 mg/dL (H)).    Allergies  Allergen Reactions   Codeine Nausea And Vomiting   Antimicrobials this admission: Unasyn 4/7 >>  Doxycycline 4/7 >>  Zosyn 4/7 x1  Dose adjustments this admission: N/A  Microbiology results: 4/7 C diff PCR: pending 4/7 GI panel: pending 4/7 MRSA PCR: negative  Thank you for involving pharmacy in this patient's care.   Rockwell Alexandria, PharmD Clinical Pharmacist 07/02/2023 7:43 PM

## 2023-07-02 NOTE — Consult Note (Addendum)
 Gastroenterology Consult   Referring Provider: [facility] Primary Care Physician:  [name], FNP Primary Gastroenterologist:  Dr. Marletta Lor  Patient ID: Jiles Garter; [id number]; Sep 29, 1950   Admit date: 07/02/2023  LOS: 0 days   Date of Consultation: 07/02/2023  Reason for Consultation:  Acute anemia, heme negative stool, abdominal pain, hx of colitis   History of Present Illness   Janesia Joswick is a 73 y.o. year old female with PMH of GERD, anxiety, arthritis, depression, hyperlipidemia, recently with colitis on CT 05/26/23 in ED, s/p colonoscopy 328 with active colitis on path but no chronic features with differentials as below, started empirically on Cipro and Flagyl Friday, presenting with severe weakness, persistent N/V since Friday, and ongoing abdominal pain for past several months unchanged.    In the ED: Temp 94.5 F, repeat 96, soft BPs, Hgb 6, down from 12 a month ago. WBC count 15.8. platelets 528. Acute renal failure with creatinine 3.09. Newly elevated transaminases with AST 149, ALT 72, Alk Phos 169. Previously normal transaminases. Alk phos previously 190. BNP 1141. Troponin 230. Lactic acid 2.  D-dimer elevated at 5.10. CXR with patchy bilateral airspace disease could reflect edema or multifocal pneumonia. ECHO ordered. Bilateral venous US ordered. Abd x ray 2 view completed with results pending. Cardiology has been consulted.    Has been feeling weak for a few months but worsening over the past few weeks. No outright melena or hematochezia. Went to NP regarding left-sided abdominal pain initially about 2 months ago. . Pain for about 2 months. Feels present consistently. Not as painful as it has been. Would take 2 bites of food and would be full. No significant postprandial abdominal pain. Weight loss of about 15 lbs in past 2 months. No diarrhea. One BM since colonoscopy. Usually on more constipated side. Has been taking benefiber since saw GI as outpatient. No NSAIDs. Did  not urinate yesterday. +flatus.   Over past few months has felt tired, more shortness of breath with exertion. No chest pain. No dysphagia.   Intermittent nausea but no vomiting. Started vomiting when she started abx on Friday. (Cipro and Flagyl). Significant nausea with sitting up yesterday. In the mornings would feel "ok" but worsen throughout. No fever or chills.   She feels improved since presenting to the ED. On nasal cannula oxygen. Able to converse easily although occasionally short of breath.    Prior CT in March 2025: CT 05/26/23 in ED with bowel wall thickening of cecum and ascending colon   Colonoscopy 3/28 with patchy mild inflammation in sigmoid, descending colon, and transverse colon secondary to colitis, diverticulosis noted. Pathology: active colitis, no chronicity features. Differentials infection, med effect, ischemic colitis, less likely IBD  Cipro and flagyl had been sent to pharmacy on 4/4 with recommendations to complete stool studies.   Mother with history of pernicious anemia.     Past Medical History:  Diagnosis Date   Anxiety    Arthritis    Depression    GERD (gastroesophageal reflux disease)    Hyperlipidemia     Past Surgical History:  Procedure Laterality Date   APPENDECTOMY     CATARACT EXTRACTION Bilateral    CESAREAN SECTION     x2   COLONOSCOPY N/A 06/22/2023   Procedure: COLONOSCOPY;  Surgeon: Lanelle Bal, DO;  Location: [facility];  Service: Endoscopy;  Laterality: N/A;  130PM, OK RM 1    Prior to Admission medications   Medication Sig Start Date End Date  Taking? Authorizing Provider  ALPRAZolam (XANAX) 0.25 MG tablet Take 1 tablet (0.25 mg total) by mouth at bedtime as needed for anxiety. 01/09/23   Jannifer Rodney A, FNP  Ascorbic Acid (VITAMIN C) 1000 MG tablet Take 1,000 mg by mouth daily.    [provider]  atorvastatin (LIPITOR) 20 MG tablet TAKE 1 TABLET BY MOUTH EVERY DAY 01/17/23   Jannifer Rodney A, FNP  Calcium  Carbonate-Vit D-Min (CALCIUM 1200 PO) Take by mouth.    [provider]  Cholecalciferol (VITAMIN D3) 1.25 MG (50000 UT) CAPS Take by mouth.    [provider]  ciprofloxacin (CIPRO) 500 MG tablet Take 1 tablet (500 mg total) by mouth 2 (two) times daily for 10 days. 06/29/23 07/09/23  Lanelle Bal, DO  diclofenac (VOLTAREN) 75 MG EC tablet Take 1 tablet (75 mg total) by mouth 2 (two) times daily. 01/09/23   [name], FNP  dicyclomine (BENTYL) 20 MG tablet Take 1 tablet (20 mg total) by mouth 2 (two) times daily. 06/27/23 06/26/24  Lanelle Bal, DO  metroNIDAZOLE (FLAGYL) 500 MG tablet Take 1 tablet (500 mg total) by mouth 2 (two) times daily for 10 days. 06/29/23 07/09/23  Lanelle Bal, DO  mirabegron ER (MYRBETRIQ) 50 MG TB24 tablet Take 1 tablet (50 mg total) by mouth daily. 07/31/22   [name], FNP  omeprazole (PRILOSEC) 40 MG capsule Take 1 capsule (40 mg total) by mouth daily. 05/15/23   St Vena [place], NP  ondansetron (ZOFRAN) 4 MG tablet Take 1 tablet (4 mg total) by mouth every 8 (eight) hours as needed for nausea. 06/18/23   Jannifer Rodney A, FNP  tolterodine (DETROL LA) 4 MG 24 hr capsule Take 1 capsule (4 mg total) by mouth daily. 09/25/22   Jerilee Field, MD  venlafaxine XR (EFFEXOR-XR) 75 MG 24 hr capsule Take 1 capsule (75 mg total) by mouth daily. 07/31/22   Jannifer Rodney A, FNP  zinc gluconate 50 MG tablet Take 50 mg by mouth daily.    [provider]    Current Facility-Administered Medications  Medication Dose Route Frequency Provider Last Rate Last Admin   0.9 %  sodium chloride infusion (Manually program via Guardrails IV Fluids)   Intravenous Once Bethann Berkshire, MD       pantoprazole (PROTONIX) injection 40 mg  40 mg Intravenous Once Bethann Berkshire, MD       Current Outpatient Medications  Medication Sig Dispense Refill   ALPRAZolam (XANAX) 0.25 MG tablet Take 1 tablet (0.25 mg total) by mouth at bedtime as needed for  anxiety. 20 tablet 2   Ascorbic Acid (VITAMIN C) 1000 MG tablet Take 1,000 mg by mouth daily.     atorvastatin (LIPITOR) 20 MG tablet TAKE 1 TABLET BY MOUTH EVERY DAY 90 tablet 1   Calcium Carbonate-Vit D-Min (CALCIUM 1200 PO) Take by mouth.     Cholecalciferol (VITAMIN D3) 1.25 MG (50000 UT) CAPS Take by mouth.     ciprofloxacin (CIPRO) 500 MG tablet Take 1 tablet (500 mg total) by mouth 2 (two) times daily for 10 days. 20 tablet 0   diclofenac (VOLTAREN) 75 MG EC tablet Take 1 tablet (75 mg total) by mouth 2 (two) times daily. 60 tablet 1   dicyclomine (BENTYL) 20 MG tablet Take 1 tablet (20 mg total) by mouth 2 (two) times daily. 60 tablet 11   metroNIDAZOLE (FLAGYL) 500 MG tablet Take 1 tablet (500 mg total) by mouth 2 (two) times  daily for 10 days. 20 tablet 0   mirabegron ER (MYRBETRIQ) 50 MG TB24 tablet Take 1 tablet (50 mg total) by mouth daily. 30 tablet 11   omeprazole (PRILOSEC) 40 MG capsule Take 1 capsule (40 mg total) by mouth daily. 90 capsule 1   ondansetron (ZOFRAN) 4 MG tablet Take 1 tablet (4 mg total) by mouth every 8 (eight) hours as needed for nausea. 60 tablet 1   tolterodine (DETROL LA) 4 MG 24 hr capsule Take 1 capsule (4 mg total) by mouth daily. 90 capsule 3   venlafaxine XR (EFFEXOR-XR) 75 MG 24 hr capsule Take 1 capsule (75 mg total) by mouth daily. 90 capsule 3   zinc gluconate 50 MG tablet Take 50 mg by mouth daily.      Allergies as of 07/02/2023 - Review Complete 07/02/2023  Allergen Reaction Noted   Codeine Nausea And Vomiting 12/06/2015    Family History  Problem Relation Age of Onset   Cancer Father        lung   Healthy Sister    Healthy Daughter    Healthy Son     Social History   Socioeconomic History   Marital status: Married    Spouse name: Molly Maduro   Number of children: 2   Years of education: 18   Highest education level: Master's degree (e.g., MA, MS, MEng, MEd, MSW, MBA)  Occupational History   Occupation: Retired   Tobacco Use    Smoking status: Former    Current packs/day: 0.00    Average packs/day: 1 pack/day for 43.0 years (43.0 ttl pk-yrs)    Types: Cigarettes    Start date: 01/25/1969    Quit date: 01/26/2012    Years since quitting: 11.4   Smokeless tobacco: Never  Vaping Use   Vaping status: Never Used  Substance and Sexual Activity   Alcohol use: Yes    Comment: occassional-glass of wine once a month   Drug use: Yes    Types: Marijuana    Comment: gummies for nausea   Sexual activity: Yes  Other Topics Concern   Not on file  Social History Narrative   Not on file   Social Drivers of Health   Financial Resource Strain: Low Risk  (05/15/2023)   Overall Financial Resource Strain (CARDIA)    Difficulty of Paying Living Expenses: Not hard at all  Food Insecurity: No Food Insecurity (05/15/2023)   Hunger Vital Sign    Worried About Running Out of Food in the Last Year: Never true    Ran Out of Food in the Last Year: Never true  Transportation Needs: No Transportation Needs (05/15/2023)   PRAPARE - Administrator, Civil Service (Medical): No    Lack of Transportation (Non-Medical): No  Physical Activity: Sufficiently Active (05/15/2023)   Exercise Vital Sign    Days of Exercise per Week: 3 days    Minutes of Exercise per Session: 90 min  Stress: No Stress Concern Present (05/15/2023)   [employer] of Occupational Health - Occupational Stress Questionnaire    Feeling of Stress : Only a little  Social Connections: Moderately Integrated (05/15/2023)   Social Connection and Isolation Panel [NHANES]    Frequency of Communication with Friends and Family: Three times a week    Frequency of Social Gatherings with Friends and Family: Once a week    Attends Religious Services: Never    Database administrator or Organizations: Yes    Attends Banker Meetings:  1 to 4 times per year    Marital Status: Married  Catering manager Violence: Not At Risk (02/12/2020)   Humiliation,  Afraid, Rape, and Kick questionnaire    Fear of Current or Ex-Partner: No    Emotionally Abused: No    Physically Abused: No    Sexually Abused: No     Review of Systems   See HPI  Physical Exam   Vital Signs in last 24 hours: Temp:  [94.5 F (34.7 C)-96 F (35.6 C)] 96 F (35.6 C) (04/07 1242) Pulse Rate:  [86-94] 86 (04/07 1247) Resp:  [15-24] 15 (04/07 1247) BP: (106)/(61) 106/61 (04/07 1247) SpO2:  [70 %-93 %] 93 % (04/07 1247) Weight:  [83 kg] 83 kg (04/07 1236)    General:   Alert,  acutely ill, shortness of breath with longer sentences, conversant, pleasant and cooperative, nasal cannula O2 Head:  Normocephalic and atraumatic. Eyes:  Sclera clear, no icterus.    Lungs:  diminished bases Heart:  S1 S2 present, systolic murmur Abdomen:  Soft, very mild chronic TTP left sided abdomen (upper and lower) without rebound or guarding and nondistended. +BS Rectal: deferred   Msk:  Symmetrical without gross deformities. Normal posture. Extremities:  Without trace ankle/pedal edema. Neurologic:  Alert and  oriented x4. Skin:  Intact without significant lesions or rashes. Psych:  Alert and cooperative. Normal mood and affect.  Intake/Output from previous day: No intake/output data recorded. Intake/Output this shift: Total I/O In: 500 [I.V.:500] Out: -     Labs/Studies   Recent Labs Recent Labs    07/02/23 1333  WBC 15.8*  HGB 6.0*  HCT 18.8*  PLT 528*   BMET Recent Labs    07/02/23 1333  NA 134*  K 4.0  CL 99  CO2 17*  GLUCOSE 152*  BUN 45*  CREATININE 3.09*  CALCIUM 8.6*   LFT Recent Labs    07/02/23 1333  PROT 7.0  ALBUMIN 2.2*  AST 149*  ALT 72*  ALKPHOS 169*  BILITOT 0.9   Lactic Acid, Venous    Component Value Date/Time   LATICACIDVEN 2.0 (HH) 07/02/2023 1436        Assessment   Nevelyn Mellott is a 73 y.o. year old female  with past medical history of GERD, anxiety, arthritis, depression, hyperlipidemia, recently with  colitis on CT 05/26/23 in ED and subsequent colonoscopy 3/28 for diagnostic purposes with active colitis on path but no chronic features, several month history of left-sided abdominal pain, loss of appetite, weight loss, presenting with worsening fatigue, weakness, N/V, and baseline abdominal pain unchanged. GI consulted due to acute anemia with Hgb 6, heme negative stool.  Acute anemia: Hgb 6, down from 12 a month ago. Interesting presentation, as she has no overt GI bleeding, abdominal exam overall is benign. Do not appreciate any concerning signs on abdominal exam.  I am rechecking a stat H/H to ensure this is valid. Colonoscopy on file with colitis.  Rechecking H/H. Stat KUB ordered and pending. Consider CT without contrast (due to renal function) if concern for intraabdominal process. Await xray first. Once stabilized and ensure no other source of bleeding, could pursue EGD prior to discharge.   Abdominal pain: several month history of left-sided abdominal pain underlying, loss of appetite, associated weight loss, and underlying nausea. Nausea worsened with associated vomiting once starting empiric abx for colitis. Would recommend holding off on Cipro and Flagyl currently. Unclear etiology for colitis but unable to rule out an ischemic process in light  of new onset heart failure. If severe or acute on chronic abdominal pain, needs stat CTA.  Elevated LFTs: likely congestive hepatopathy in setting of acute heart failure. Continue to follow and can order serologies if worsening.     Plan / Recommendations    Stat KUB pending: may need non-contrast CT Repeat H/H stat. Anemia panel ordered Follow LFTs Would hold off on oral cipro/flagyl in light of recent vomiting while taking and switch to IV broad spectrum abx Consider EGD prior to discharge once stabilized Monitor for acute on chronic abdominal pain or sudden/severe abdominal pain Appreciate cardiology following BID PPI    07/02/2023, 2:29  PM  Gelene Mink, PhD, ANP-BC [facility]

## 2023-07-02 NOTE — ED Provider Notes (Signed)
 [facility] AT [facility] Provider Note   CSN: [id number] Arrival date & time: 07/02/23  1220     History {Add pertinent medical, surgical, social history, OB history to HPI:1} Chief Complaint  Patient presents with   Fatigue    Sabrina Stein is a 73 y.o. female.  Patient states that she has been vomiting since Friday.  She had a colonoscopy on Friday and they determined she had colitis and she was put on Cipro and Flagyl.  Patient also complains of shortness of breath.   Emesis      Home Medications Prior to Admission medications   Medication Sig Start Date End Date Taking? Authorizing Provider  ALPRAZolam (XANAX) 0.25 MG tablet Take 1 tablet (0.25 mg total) by mouth at bedtime as needed for anxiety. 01/09/23   Jannifer Rodney A, FNP  Ascorbic Acid (VITAMIN C) 1000 MG tablet Take 1,000 mg by mouth daily.    [provider]  atorvastatin (LIPITOR) 20 MG tablet TAKE 1 TABLET BY MOUTH EVERY DAY 01/17/23   Jannifer Rodney A, FNP  Calcium Carbonate-Vit D-Min (CALCIUM 1200 PO) Take by mouth.    [provider]  Cholecalciferol (VITAMIN D3) 1.25 MG (50000 UT) CAPS Take by mouth.    [provider]  ciprofloxacin (CIPRO) 500 MG tablet Take 1 tablet (500 mg total) by mouth 2 (two) times daily for 10 days. 06/29/23 07/09/23  Lanelle Bal, DO  diclofenac (VOLTAREN) 75 MG EC tablet Take 1 tablet (75 mg total) by mouth 2 (two) times daily. 01/09/23   [name], FNP  dicyclomine (BENTYL) 20 MG tablet Take 1 tablet (20 mg total) by mouth 2 (two) times daily. 06/27/23 06/26/24  Lanelle Bal, DO  metroNIDAZOLE (FLAGYL) 500 MG tablet Take 1 tablet (500 mg total) by mouth 2 (two) times daily for 10 days. 06/29/23 07/09/23  Lanelle Bal, DO  mirabegron ER (MYRBETRIQ) 50 MG TB24 tablet Take 1 tablet (50 mg total) by mouth daily. 07/31/22   [name], FNP  omeprazole (PRILOSEC) 40 MG capsule Take 1 capsule (40 mg total) by mouth  daily. 05/15/23   St Vena Austria, NP  ondansetron (ZOFRAN) 4 MG tablet Take 1 tablet (4 mg total) by mouth every 8 (eight) hours as needed for nausea. 06/18/23   Jannifer Rodney A, FNP  tolterodine (DETROL LA) 4 MG 24 hr capsule Take 1 capsule (4 mg total) by mouth daily. 09/25/22   Jerilee Field, MD  venlafaxine XR (EFFEXOR-XR) 75 MG 24 hr capsule Take 1 capsule (75 mg total) by mouth daily. 07/31/22   Jannifer Rodney A, FNP  zinc gluconate 50 MG tablet Take 50 mg by mouth daily.    [provider]      Allergies    Codeine    Review of Systems   Review of Systems  Gastrointestinal:  Positive for vomiting.    Physical Exam Updated Vital Signs BP (!) 113/40   Pulse 87   Temp (!) 96 F (35.6 C) (Rectal)   Resp 19   Ht 5\' 2"  (1.575 m)   Wt 83 kg   SpO2 93%   BMI 33.47 kg/m  Physical Exam  ED Results / Procedures / Treatments   Labs (all labs ordered are listed, but only abnormal results are displayed) Labs Reviewed  CBC WITH DIFFERENTIAL/PLATELET - Abnormal; Notable for the following components:      Result Value   WBC 15.8 (*)    RBC  2.00 (*)    Hemoglobin 6.0 (*)    HCT 18.8 (*)    Platelets 528 (*)    nRBC 0.3 (*)    Neutro Abs 13.4 (*)    Abs Immature Granulocytes 0.29 (*)    All other components within normal limits  COMPREHENSIVE METABOLIC PANEL WITH GFR - Abnormal; Notable for the following components:   Sodium 134 (*)    CO2 17 (*)    Glucose, Bld 152 (*)    BUN 45 (*)    Creatinine, Ser 3.09 (*)    Calcium 8.6 (*)    Albumin 2.2 (*)    AST 149 (*)    ALT 72 (*)    Alkaline Phosphatase 169 (*)    GFR, Estimated 15 (*)    Anion gap 18 (*)    All other components within normal limits  BRAIN NATRIURETIC PEPTIDE - Abnormal; Notable for the following components:   B Natriuretic Peptide 1,141.0 (*)    All other components within normal limits  D-DIMER, QUANTITATIVE - Abnormal; Notable for the following components:   D-Dimer, Quant 5.10 (*)     All other components within normal limits  TROPONIN I (HIGH SENSITIVITY) - Abnormal; Notable for the following components:   Troponin I (High Sensitivity) 230 (*)    All other components within normal limits  MRSA NEXT GEN BY PCR, NASAL  HEMOGLOBIN A1C  LACTIC ACID, PLASMA  PROCALCITONIN  POC OCCULT BLOOD, ED  TYPE AND SCREEN  PREPARE RBC (CROSSMATCH)  ABO/RH    EKG None  Radiology DG Chest Port 1 View Result Date: 07/02/2023 CLINICAL DATA:  Shortness of breath EXAM: PORTABLE CHEST 1 VIEW COMPARISON:  05/26/2023 FINDINGS: Mild cardiomegaly. Mediastinal contours within normal limits. Patchy bilateral airspace disease, new since prior study. No effusions. No acute bony abnormality. IMPRESSION: Patchy bilateral airspace disease could reflect edema or multifocal pneumonia. Mild cardiomegaly. Electronically Signed   By: Charlett Nose M.D.   On: 07/02/2023 14:28    Procedures Procedures  {Document cardiac monitor, telemetry assessment procedure when appropriate:1}  Medications Ordered in ED Medications  0.9 %  sodium chloride infusion (Manually program via Guardrails IV Fluids) (has no administration in time range)  furosemide (LASIX) injection 40 mg (has no administration in time range)  hydrALAZINE (APRESOLINE) injection 10 mg (has no administration in time range)  pantoprazole (PROTONIX) injection 40 mg (has no administration in time range)  Chlorhexidine Gluconate Cloth 2 % PADS 6 each (has no administration in time range)  ciprofloxacin (CIPRO) tablet 500 mg (has no administration in time range)  metroNIDAZOLE (FLAGYL) tablet 500 mg (has no administration in time range)  atorvastatin (LIPITOR) tablet 20 mg (has no administration in time range)  ALPRAZolam (XANAX) tablet 0.25 mg (has no administration in time range)  venlafaxine XR (EFFEXOR-XR) 24 hr capsule 75 mg (has no administration in time range)  mirabegron ER (MYRBETRIQ) tablet 50 mg (has no administration in time range)   fesoterodine (TOVIAZ) tablet 4 mg (has no administration in time range)  sodium chloride flush (NS) 0.9 % injection 3 mL (has no administration in time range)  sodium chloride flush (NS) 0.9 % injection 3 mL (has no administration in time range)  sodium chloride flush (NS) 0.9 % injection 3 mL (has no administration in time range)  0.9 %  sodium chloride infusion (has no administration in time range)  acetaminophen (TYLENOL) tablet 650 mg (has no administration in time range)    Or  acetaminophen (TYLENOL) suppository 650  mg (has no administration in time range)  traZODone (DESYREL) tablet 50 mg (has no administration in time range)  polyethylene glycol (MIRALAX / GLYCOLAX) packet 17 g (has no administration in time range)  bisacodyl (DULCOLAX) suppository 10 mg (has no administration in time range)  ondansetron (ZOFRAN) tablet 4 mg (has no administration in time range)    Or  ondansetron (ZOFRAN) injection 4 mg (has no administration in time range)  sodium chloride 0.9 % bolus 1,000 mL (0 mLs Intravenous Stopped 07/02/23 1404)  ondansetron (ZOFRAN) injection 4 mg (4 mg Intravenous Given 07/02/23 1336)  piperacillin-tazobactam (ZOSYN) IVPB 3.375 g (3.375 g Intravenous New Bag/Given 07/02/23 1337)    ED Course/ Medical Decision Making/ A&P  CRITICAL CARE Performed by: Bethann Berkshire Total critical care time: 60 minutes Critical care time was exclusive of separately billable procedures and treating other patients. Critical care was necessary to treat or prevent imminent or life-threatening deterioration. Critical care was time spent personally by me on the following activities: development of treatment plan with patient and/or surrogate as well as nursing, discussions with consultants, evaluation of patient's response to treatment, examination of patient, obtaining history from patient or surrogate, ordering and performing treatments and interventions, ordering and review of laboratory studies,  ordering and review of radiographic studies, pulse oximetry and re-evaluation of patient's condition.  {Patient has profound anemia with a hemoglobin of 6.  Rectal exam was heme-negative.   I spoke with Dr. Marletta Lor and they will consult on the patient.  Patient may need an upper endoscopy.  Patient also was in congestive heart failure and AKI.  Dr. Wyline Mood cardiology will consult on the patient and felt like the patient could be admitted to the medicine service at [facility] Click here for ABCD2, HEART and other calculatorsREFRESH Note before signing :1}                              Medical Decision Making Amount and/or Complexity of Data Reviewed Labs: ordered. Radiology: ordered. ECG/medicine tests: ordered.  Risk Prescription drug management. Decision regarding hospitalization.   Patient will be admitted to medicine and transfused 2 units packed red cells.  Both cardiology and GI will consult  {Document critical care time when appropriate:1} {Document review of labs and clinical decision tools ie heart score, Chads2Vasc2 etc:1}  {Document your independent review of radiology images, and any outside records:1} {Document your discussion with family members, caretakers, and with consultants:1} {Document social determinants of health affecting pt's care:1} {Document your decision making why or why not admission, treatments were needed:1} Final Clinical Impression(s) / ED Diagnoses Final diagnoses:  Systolic congestive heart failure, unspecified HF chronicity (HCC)  Iron deficiency anemia due to chronic blood loss    Rx / DC Orders ED Discharge Orders     None

## 2023-07-02 NOTE — Progress Notes (Signed)
   07/02/23 1836  TOC Brief Assessment  Insurance and Status Reviewed  Patient has primary care physician Yes  Home environment has been reviewed From home c/husband  Prior level of function: Independent  Prior/Current Home Services No current home services  Social Drivers of Health Review SDOH reviewed no interventions necessary  Readmission risk has been reviewed Yes  Transition of care needs no transition of care needs at this time   Transition of Care Department [facility]) has reviewed patient and no other TOC needs have been identified at this time. We will continue to monitor patient advancement through interdisciplinary progression rounds. If new patient needs arise, please place a TOC consult.

## 2023-07-02 NOTE — H&P (Signed)
 History and Physical    Patient: Sabrina Stein ZOX:[id number] DOB: 1950-12-17 DOA: 07/02/2023 DOS: the patient was seen and examined on 07/02/2023 PCP: [name], FNP  Patient coming from: Home  Chief Complaint:  Chief Complaint  Patient presents with   Fatigue   HPI: Sabrina Stein is a 73 y.o. female with medical history significant for recent diagnosis of colitis, depressive disorder with anxiety, HLD and GERD, presents to the ED with worsening weakness, fatigue, malaise, emesis since starting Cipro and Flagyl on 06/29/2023.Marland Kitchen  Emesis is without blood but she thought she may have had some bile in the emesis since Friday. -No bloody stools -Abdominal pain persist, no fevers no chills -In the ED she was found to be borderline hypotensive with diastolic blood pressure in the 40s and systolic in the 90s 0 in the ED patient was found to be hypoxic requiring up to 5 L of oxygen via nasal cannula (not on home O2)  Patient underwent colonoscopy with Dr. Marletta Lor on 06/22/2023 with colonoscopy findings of  Patchy mild inflammation was found in the sigmoid colon, in the descending colon and in the transverse colon secondary to colitis. Biopsied. - Diverticulosis in the sigmoid colon and in the descending colon. =-Pathology was consistent with active colitis -- In ED-  Hgb 6, down from 12 a month ago. WBC count 15.8. platelets 528. Acute renal failure with creatinine 3.09. Newly elevated transaminases with AST 149, ALT 72, Alk Phos 169. Previously normal transaminases. Alk phos previously 190. BNP 1141. Troponin 230. Lactic acid 2.  D-dimer elevated at 5.10. CXR with patchy bilateral airspace disease could reflect edema or multifocal pneumonia.  --Procalcitonin elevated at 3.44, lactic acid 2.0 -Bilateral venous Dopplers without DVT -Abdominal x-rays without acute findings -CT chest abdomen and pelvis without contrast pending -Stool occult blood negative -Ferritin and folate are not low -B12 is low  normal -Serum iron is not low, TIBC is low, iron saturation is normal -BNP 1141, no baseline available -Repeat hemoglobin 6.3 -Bicarb low at 17, anion gap elevated to 18, glucose 152, -A1c 5.8, -EKG sinus rhythm without acute changes  Review of Systems: As mentioned in the history of present illness. All other systems reviewed and are negative. Past Medical History:  Diagnosis Date   Anxiety    Arthritis    Depression    GERD (gastroesophageal reflux disease)    Hyperlipidemia    Past Surgical History:  Procedure Laterality Date   APPENDECTOMY     CATARACT EXTRACTION Bilateral    CESAREAN SECTION     x2   COLONOSCOPY N/A 06/22/2023   Procedure: COLONOSCOPY;  Surgeon: Lanelle Bal, DO;  Location: [facility];  Service: Endoscopy;  Laterality: N/A;  130PM, OK RM 1   Social History:  reports that she quit smoking about 11 years ago. Her smoking use included cigarettes. She started smoking about 54 years ago. She has a 43 pack-year smoking history. She has never used smokeless tobacco. She reports current alcohol use. She reports current drug use. Drug: Marijuana.  Allergies  Allergen Reactions   Codeine Nausea And Vomiting    Family History  Problem Relation Age of Onset   Cancer Father        lung   Healthy Sister    Healthy Daughter    Healthy Son     Prior to Admission medications   Medication Sig Start Date End Date Taking? Authorizing Provider  ALPRAZolam (XANAX) 0.25 MG tablet Take 1 tablet (0.25 mg total)  by mouth at bedtime as needed for anxiety. 01/09/23   Jannifer Rodney A, FNP  Ascorbic Acid (VITAMIN C) 1000 MG tablet Take 1,000 mg by mouth daily.    [provider]  atorvastatin (LIPITOR) 20 MG tablet TAKE 1 TABLET BY MOUTH EVERY DAY 01/17/23   Jannifer Rodney A, FNP  Calcium Carbonate-Vit D-Min (CALCIUM 1200 PO) Take by mouth.    [provider]  Cholecalciferol (VITAMIN D3) 1.25 MG (50000 UT) CAPS Take by mouth.    [provider]  ciprofloxacin (CIPRO) 500 MG tablet Take 1 tablet (500 mg total) by mouth 2 (two) times daily for 10 days. 06/29/23 07/09/23  Lanelle Bal, DO  diclofenac (VOLTAREN) 75 MG EC tablet Take 1 tablet (75 mg total) by mouth 2 (two) times daily. 01/09/23   [name], FNP  dicyclomine (BENTYL) 20 MG tablet Take 1 tablet (20 mg total) by mouth 2 (two) times daily. 06/27/23 06/26/24  Lanelle Bal, DO  metroNIDAZOLE (FLAGYL) 500 MG tablet Take 1 tablet (500 mg total) by mouth 2 (two) times daily for 10 days. 06/29/23 07/09/23  Lanelle Bal, DO  mirabegron ER (MYRBETRIQ) 50 MG TB24 tablet Take 1 tablet (50 mg total) by mouth daily. 07/31/22   [name], FNP  omeprazole (PRILOSEC) 40 MG capsule Take 1 capsule (40 mg total) by mouth daily. 05/15/23   St Vena Austria, NP  ondansetron (ZOFRAN) 4 MG tablet Take 1 tablet (4 mg total) by mouth every 8 (eight) hours as needed for nausea. 06/18/23   Jannifer Rodney A, FNP  tolterodine (DETROL LA) 4 MG 24 hr capsule Take 1 capsule (4 mg total) by mouth daily. 09/25/22   Jerilee Field, MD  venlafaxine XR (EFFEXOR-XR) 75 MG 24 hr capsule Take 1 capsule (75 mg total) by mouth daily. 07/31/22   Jannifer Rodney A, FNP  zinc gluconate 50 MG tablet Take 50 mg by mouth daily.    [provider]    Physical Exam: Vitals:   07/02/23 1621 07/02/23 1707 07/02/23 1725 07/02/23 1800  BP:  (!) 150/61 (!) 146/84 120/89  Pulse: (!) 112 89 87 88  Resp: 18  18 (!) 24  Temp:  97.7 F (36.5 C) 97.9 F (36.6 C)   TempSrc:  Oral Oral   SpO2:   (!) 86% 98%  Weight:      Height:        Physical Exam  Gen:- Awake Alert, in no acute distress , conversational dyspnea HEENT:- [place].AT, No sclera icterus Nose- [place] 5L/min Neck-Supple Neck,No JVD,.  Lungs-diminished breath sounds with scattered rhonchi bilaterally  CV- S1, S2 normal, RRR Abd-  +ve B.Sounds, Abd Soft, mild diffuse abdominal discomfort on palpation, no rebound or  guarding Extremity/Skin:- No  edema,   good pedal pulses  Psych-affect is appropriate, oriented x3 Neuro-generalized weakness, no new focal deficits, no tremors  Data Reviewed:  Hgb 6, down from 12 a month ago. WBC count 15.8. platelets 528. Acute renal failure with creatinine 3.09. Newly elevated transaminases with AST 149, ALT 72, Alk Phos 169. Previously normal transaminases. Alk phos previously 190. BNP 1141. Troponin 230. Lactic acid 2.  D-dimer elevated at 5.10. CXR with patchy bilateral airspace disease could reflect edema or multifocal pneumonia.  --Procalcitonin elevated at 3.44, lactic acid 2.0 -Bilateral venous Dopplers without DVT -Abdominal x-rays without acute findings -CT chest abdomen and pelvis without contrast pending -Stool occult blood negative -Ferritin and folate are not low -B12 is low  normal -Serum iron is not low, TIBC is low, iron saturation is normal -BNP 1141, no baseline available -Repeat hemoglobin 6.3 -Bicarb low at 17, anion gap elevated to 18, glucose 152, -A1c 5.8, -EKG sinus rhythm without acute changes  Assessment and Plan: 1) acute hypoxic respiratory failure--- suspect aspiration pneumonia related  currently requiring up to 5 L of oxygen via nasal cannula -Given recurrent emesis aspiration pneumonia is in differential WBC count 15.8, -Procalcitonin elevated at 3.44, lactic acid 2.0 -Treat empirically with IV Unasyn to cover for both presumed aspiration pneumonia and colitis -Stop Cipro and Flagyl as patient did not tolerate Flagyl due to recurrent emesis -- CT chest without contrast pending -Add doxycycline for atypical coverage  2) colitis--Patient underwent colonoscopy with Dr. Marletta Lor on 06/22/2023 with colonoscopy findings of Patchy mild inflammation was found in the sigmoid colon, in the descending colon and in the transverse colon secondary to colitis. Biopsied. - Diverticulosis in the sigmoid colon and in the descending colon. =-Pathology was  consistent with active colitis -- Antibiotics changed as above #1 -Get stool for C. difficile and stool pathogen/GI culture -GI consult appreciated -CT abdomen and pelvis without contrast pending  3)Severe Acute Anemia--- --Hgb was 12.1 on 05/26/2023 -Hgb today was 6.0 repeat 6.3 suspect acute GI blood loss related in the setting of ongoing colitis -Patient is Hemoccult negative -Emesis without blood -Protonix as ordered =-Transfused 2 units of PRBC as ordered with Lasix in between --Stool occult blood negative -Ferritin and folate are not low -B12 is low normal -Serum iron is not low, TIBC is low, iron saturation is normal -Stop diclofenac --GI input appreciated =- 4)Transaminitis--- Newly elevated transaminases with AST 149, ALT 72, Alk Phos 169. Previously normal transaminases. Alk phos previously 190. -- Monitor closely -Avoid hepatotoxic agents  5)Elevated troponin/elevated BNP--- suspect demand ischemia in the setting of severe anemia and hypoxia - BNP 1141. Troponin 230, -EKG sinus rhythm without acute changes --Discussed with Dr. Wyline Mood from cardiology service -Echo from 07/02/2023 with EF of 55 to 70%, grade 1 diastolic dysfunction noted, no regional wall motion normalities, mildly elevated pulmonary artery systolic pressure noted, no mitral stenosis, mild aortic stenosis noted - 6)D-dimer elevated at 5.10---LE  venous Dopplers without DVT  7) depression/anxiety--continue Effexor, may use Xanax as needed   Advance Care Planning:   Code Status: Full Code   Consults: GI  Family Communication: None at bedside  Severity of Illness: The appropriate patient status for this patient is INPATIENT. Inpatient status is judged to be reasonable and necessary in order to provide the required intensity of service to ensure the patient's safety. The patient's presenting symptoms, physical exam findings, and initial radiographic and laboratory data in the context of their chronic  comorbidities is felt to place them at high risk for further clinical deterioration. Furthermore, it is not anticipated that the patient will be medically stable for discharge from the hospital within 2 midnights of admission.   * I certify that at the point of admission it is my clinical judgment that the patient will require inpatient hospital care spanning beyond 2 midnights from the point of admission due to high intensity of service, high risk for further deterioration and high frequency of surveillance required.*  Author: Shon Hale, MD 07/02/2023 7:07 PM  For on call review [email or web address].

## 2023-07-02 NOTE — Progress Notes (Signed)
*  PRELIMINARY RESULTS* Echocardiogram 2D Echocardiogram has been performed.  [name] 07/02/2023, 4:57 PM

## 2023-07-02 NOTE — ED Notes (Addendum)
 CRITICAL VALUE STICKER  CRITICAL VALUE: hemoglobin 6.0  RECEIVER (on-site recipient of call):Dondrell Loudermilk rn  DATE & TIME NOTIFIED: 07/02/2023 1350  MESSENGER (representative from lab): lab  MD NOTIFIED: zammit MD  TIME OF NOTIFICATION:  RESPONSE:

## 2023-07-02 NOTE — Telephone Encounter (Signed)
 Copied from CRM [phone] [id number]. Topic: General - Other >> Jul 02, 2023 12:04 PM Pierre Bali B wrote: Reason for CRM: patient husband called in to let us know she is on her way to the hospital right now. She is very weak, throwing up , pain in stomach. EMS had to get her out the bed . He also wanted to know if we can tell Dr.Carver as well if he can give the husband a call [phone] [place].

## 2023-07-02 NOTE — Progress Notes (Signed)
 Abdominal xray without free air. Stat repeat H/H is pending. If this remains low, recommend CT without contrast stat.   Gelene Mink, PhD, ANP-BC [facility]

## 2023-07-02 NOTE — ED Triage Notes (Signed)
 Pt BIB ems for weakness n/v for over 2 weeks. Per EMS pt was given 500 mL bolus d/t initial BP reading 98/48. Pt had colonoscopy on March 28 and was informed that her large intestine is inflamed. Pt states very little intake.

## 2023-07-02 NOTE — ED Notes (Signed)
 Pt placed on 5L of oxygen. Oxygen saturation 93% on [place] 5L of oxygen.

## 2023-07-02 NOTE — Progress Notes (Addendum)
 Contacted by ER for patient with elevated troponin and BNP. On review multiple medical issues with Hgb to 6, AKI, elevated LFTs, procalc elevated at 3.44, lactic acid 2, abdominal pain, CXR fluid vs atypical infection hypoxic to 70s on presentation.  The troponin is demand ischemia. BNP is elevated, its unclear based on CXR if its fluid or atypical infection. Certaintly with elevated procalc concerns would be for possibly multifocal pneumonia and based on other findings possible sepsis with multiorgan dysfunction. With her nausea and vomiting and AKI would think more likely hypovolemic.  Primary team is sending patient for CT lungs, GI is addressing abdominal issues. We have ordered an echo. Await results, at this time not seeing an acute need for cardiology involvement. Have discussed with primary team would not have any additional cardiology recs.    Dina Rich MD

## 2023-07-03 DIAGNOSIS — R7989 Other specified abnormal findings of blood chemistry: Secondary | ICD-10-CM | POA: Diagnosis not present

## 2023-07-03 DIAGNOSIS — R110 Nausea: Secondary | ICD-10-CM | POA: Diagnosis not present

## 2023-07-03 DIAGNOSIS — Z8719 Personal history of other diseases of the digestive system: Secondary | ICD-10-CM | POA: Diagnosis not present

## 2023-07-03 DIAGNOSIS — R630 Anorexia: Secondary | ICD-10-CM | POA: Diagnosis not present

## 2023-07-03 DIAGNOSIS — R634 Abnormal weight loss: Secondary | ICD-10-CM | POA: Diagnosis not present

## 2023-07-03 DIAGNOSIS — D649 Anemia, unspecified: Secondary | ICD-10-CM | POA: Diagnosis not present

## 2023-07-03 DIAGNOSIS — R6881 Early satiety: Secondary | ICD-10-CM | POA: Diagnosis not present

## 2023-07-03 LAB — URINALYSIS, ROUTINE W REFLEX MICROSCOPIC
Bilirubin Urine: NEGATIVE
Glucose, UA: NEGATIVE mg/dL
Ketones, ur: NEGATIVE mg/dL
Leukocytes,Ua: NEGATIVE
Nitrite: NEGATIVE
Protein, ur: 100 mg/dL — AB
Specific Gravity, Urine: 1.012 (ref 1.005–1.030)
pH: 5 (ref 5.0–8.0)

## 2023-07-03 LAB — HEPATITIS PANEL, ACUTE
HCV Ab: NONREACTIVE
Hep A IgM: NONREACTIVE
Hep B C IgM: NONREACTIVE
Hepatitis B Surface Ag: NONREACTIVE

## 2023-07-03 LAB — CBC
HCT: 26.6 % — ABNORMAL LOW (ref 36.0–46.0)
Hemoglobin: 9 g/dL — ABNORMAL LOW (ref 12.0–15.0)
MCH: 29.8 pg (ref 26.0–34.0)
MCHC: 33.8 g/dL (ref 30.0–36.0)
MCV: 88.1 fL (ref 80.0–100.0)
Platelets: 460 10*3/uL — ABNORMAL HIGH (ref 150–400)
RBC: 3.02 MIL/uL — ABNORMAL LOW (ref 3.87–5.11)
RDW: 14.9 % (ref 11.5–15.5)
WBC: 13.5 10*3/uL — ABNORMAL HIGH (ref 4.0–10.5)
nRBC: 0.3 % — ABNORMAL HIGH (ref 0.0–0.2)

## 2023-07-03 LAB — COMPREHENSIVE METABOLIC PANEL WITH GFR
ALT: 64 U/L — ABNORMAL HIGH (ref 0–44)
AST: 104 U/L — ABNORMAL HIGH (ref 15–41)
Albumin: 2.4 g/dL — ABNORMAL LOW (ref 3.5–5.0)
Alkaline Phosphatase: 182 U/L — ABNORMAL HIGH (ref 38–126)
Anion gap: 16 — ABNORMAL HIGH (ref 5–15)
BUN: 53 mg/dL — ABNORMAL HIGH (ref 8–23)
CO2: 19 mmol/L — ABNORMAL LOW (ref 22–32)
Calcium: 8.3 mg/dL — ABNORMAL LOW (ref 8.9–10.3)
Chloride: 99 mmol/L (ref 98–111)
Creatinine, Ser: 3.3 mg/dL — ABNORMAL HIGH (ref 0.44–1.00)
GFR, Estimated: 14 mL/min — ABNORMAL LOW (ref >60)
Glucose, Bld: 122 mg/dL — ABNORMAL HIGH (ref 70–99)
Potassium: 3.5 mmol/L (ref 3.5–5.1)
Sodium: 134 mmol/L — ABNORMAL LOW (ref 135–145)
Total Bilirubin: 1.5 mg/dL — ABNORMAL HIGH (ref 0.0–1.2)
Total Protein: 7 g/dL (ref 6.5–8.1)

## 2023-07-03 LAB — PROTIME-INR
INR: 1.7 — ABNORMAL HIGH (ref 0.8–1.2)
Prothrombin Time: 20.4 s — ABNORMAL HIGH (ref 11.4–15.2)

## 2023-07-03 MED ORDER — IPRATROPIUM-ALBUTEROL 0.5-2.5 (3) MG/3ML IN SOLN
3.0000 mL | Freq: Two times a day (BID) | RESPIRATORY_TRACT | Status: DC
  Administered 2023-07-04 – 2023-07-13 (×20): 3 mL via RESPIRATORY_TRACT
  Filled 2023-07-03 (×21): qty 3

## 2023-07-03 MED ORDER — IPRATROPIUM-ALBUTEROL 0.5-2.5 (3) MG/3ML IN SOLN
3.0000 mL | Freq: Four times a day (QID) | RESPIRATORY_TRACT | Status: DC
  Administered 2023-07-03 (×2): 3 mL via RESPIRATORY_TRACT
  Filled 2023-07-03 (×2): qty 3

## 2023-07-03 MED ORDER — DM-GUAIFENESIN ER 30-600 MG PO TB12
1.0000 | ORAL_TABLET | Freq: Two times a day (BID) | ORAL | Status: DC
  Administered 2023-07-03 – 2023-07-06 (×7): 1 via ORAL
  Filled 2023-07-03 (×7): qty 1

## 2023-07-03 MED ORDER — SODIUM CHLORIDE 0.9 % IV SOLN: INTRAVENOUS | Status: AC

## 2023-07-03 MED ORDER — ALBUTEROL SULFATE (2.5 MG/3ML) 0.083% IN NEBU
2.5000 mg | INHALATION_SOLUTION | RESPIRATORY_TRACT | Status: DC | PRN
  Administered 2023-07-06 – 2023-07-10 (×4): 2.5 mg via RESPIRATORY_TRACT
  Filled 2023-07-03 (×4): qty 3

## 2023-07-03 MED ORDER — SODIUM BICARBONATE 650 MG PO TABS
1300.0000 mg | ORAL_TABLET | Freq: Once | ORAL | Status: AC
  Administered 2023-07-03: 1300 mg via ORAL
  Filled 2023-07-03: qty 2

## 2023-07-03 MED ORDER — SODIUM BICARBONATE 650 MG PO TABS
650.0000 mg | ORAL_TABLET | Freq: Two times a day (BID) | ORAL | Status: AC
  Administered 2023-07-03 – 2023-07-06 (×6): 650 mg via ORAL
  Filled 2023-07-03 (×6): qty 1

## 2023-07-03 NOTE — Plan of Care (Signed)

## 2023-07-03 NOTE — Progress Notes (Signed)
 PROGRESS NOTE     Sabrina Stein, is a 73 y.o. female, DOB - 24-Oct-1950, ZOX:[id number]  Admit date - 07/02/2023   Admitting Physician Nam Vossler Mariea Clonts, MD  Outpatient Primary MD for the patient is [name], FNP  LOS - 1  Chief Complaint  Patient presents with   Fatigue        Brief Narrative:   73 y.o. female with medical history significant for recent diagnosis of colitis, depressive disorder with anxiety, HLD and GERD admitted on 07/02/2023 with acute hypoxic respiratory failure due to multifocal pneumonia in the setting of recurrent emesis with presumed aspiration, as well as acute symptomatic anemia    -Assessment and Plan: 1) acute hypoxic respiratory failure--- CT chest with multifocal pneumonia ---suspect aspiration pneumonia related especially given recurrent emesis prior to admission  currently requiring up to 5 L of oxygen via nasal cannula WBC count 15.8>> 13.5 -Procalcitonin elevated at 3.44, lactic acid 2.0 -c/n  IV Unasyn to cover for both presumed aspiration pneumonia and colitis -Stopped Cipro and Flagyl as patient did not tolerate Flagyl due to recurrent emesis -c/n  doxycycline for atypical coverage (avoid azithro--to avoid GI upset)   2) colitis--Patient underwent colonoscopy with Dr. Marletta Lor on 06/22/2023 with colonoscopy findings of Patchy mild inflammation was found in the sigmoid colon, in the descending colon and in the transverse colon secondary to colitis. Biopsied. - Diverticulosis in the sigmoid colon and in the descending colon. =-Pathology was consistent with active colitis -- Antibiotics changed as above #1 -Send stool for C. difficile and stool pathogen/GI culture -GI consult appreciated -CT abdomen and pelvis without contrast without significant bowel wall thickening or other significant intra-abdominal abnormalities  3)Severe Acute Anemia--- --Hgb was 12.1 on 05/26/2023 -Hgb 6.0 >>9.0 after transfusion of 2 units of packed red blood cells on  07/02/2023 suspect acute GI blood loss related in the setting of ongoing colitis -Patient is Hemoccult negative -Emesis without blood -Protonix as ordered --Stool occult blood negative -Ferritin and folate are not low -B12 is low normal -Serum iron is not low, TIBC is low, iron saturation is normal -Stopped diclofenac --GI input appreciated Patient will need EGD this admission prior to discharge once respiratory status improves especially given diclofenac use =- 4)Transaminitis--- Newly elevated transaminases  Previously normal transaminases. Alk phos previously 190. -Acute Viral  hepatitis profile negative -Avoid hepatotoxic agents    Latest Ref Rng & Units 07/03/2023    5:08 AM 07/02/2023    1:33 PM 05/26/2023   11:22 AM  Hepatic Function  Total Protein 6.5 - 8.1 g/dL 7.0  7.0  7.3   Albumin 3.5 - 5.0 g/dL 2.4  2.2  2.8   AST 15 - 41 U/L 104  149  18   ALT 0 - 44 U/L 64  72  19   Alk Phosphatase 38 - 126 U/L 182  169  190   Total Bilirubin 0.0 - 1.2 mg/dL 1.5  0.9  0.7    5)Elevated Troponin/Elevated BNP--- suspect demand ischemia in the setting of severe anemia and hypoxia - BNP 1141. Troponin 230, -EKG sinus rhythm without acute changes --Discussed with Dr. Wyline Mood from cardiology service -Echo from 07/02/2023 with EF of 55 to 70%, grade 1 diastolic dysfunction noted, no regional wall motion normalities, mildly elevated pulmonary artery systolic pressure noted, no mitral stenosis, mild aortic stenosis noted --Patient remains chest pain-free - 6)D-dimer elevated at 5.10---LE  venous Dopplers without DVT   7)Depression/anxiety--continue Effexor, may use Xanax as need  8)Elevated  BP--IV hydralazine as needed elevated BP  Status is: Inpatient   Disposition: The patient is from: Home              Anticipated d/c is to: Home              Anticipated d/c date is: > 3 days              Patient currently is not medically stable to d/c. Barriers: Not Clinically Stable-   Code Status  :   Code Status: Full Code   Family Communication:   Discussed with husband at bedside, daughter Marchelle Folks on speaker phone  DVT Prophylaxis  :   - SCDs   SCDs Start: 07/02/23 1435 Place TED hose Start: 07/02/23 1435   Lab Results  Component Value Date   PLT 460 (H) 07/03/2023    Inpatient Medications  Scheduled Meds:  atorvastatin  20 mg Oral Daily   Chlorhexidine Gluconate Cloth  6 each Topical Q0600   dextromethorphan-guaiFENesin  1 tablet Oral BID   doxycycline  100 mg Oral Q12H   fesoterodine  4 mg Oral Daily   ipratropium-albuterol  3 mL Nebulization BID   mirabegron ER  50 mg Oral Daily   pantoprazole (PROTONIX) IV  40 mg Intravenous Q12H   sodium bicarbonate  650 mg Oral BID   sodium chloride flush  3 mL Intravenous Q12H   sodium chloride flush  3 mL Intravenous Q12H   venlafaxine XR  75 mg Oral Daily   Continuous Infusions:  sodium chloride 150 mL/hr at 07/03/23 1410   ampicillin-sulbactam (UNASYN) IV 3 g (07/03/23 2000)   PRN Meds:.acetaminophen **OR** acetaminophen, albuterol, ALPRAZolam, hydrALAZINE, ondansetron **OR** ondansetron (ZOFRAN) IV, sodium chloride flush, traZODone   Anti-infectives (From admission, onward)    Start     Dose/Rate Route Frequency Ordered Stop   07/02/23 2200  doxycycline (VIBRA-TABS) tablet 100 mg        100 mg Oral Every 12 hours 07/02/23 1930     07/02/23 2030  Ampicillin-Sulbactam (UNASYN) 3 g in sodium chloride 0.9 % 100 mL IVPB        3 g 200 mL/hr over 30 Minutes Intravenous Every 12 hours 07/02/23 1942     07/02/23 2000  ciprofloxacin (CIPRO) tablet 500 mg  Status:  Discontinued        500 mg Oral 2 times daily 07/02/23 1440 07/02/23 1929   07/02/23 1800  metroNIDAZOLE (FLAGYL) tablet 500 mg  Status:  Discontinued        500 mg Oral 2 times daily 07/02/23 1440 07/02/23 1929   07/02/23 1315  piperacillin-tazobactam (ZOSYN) IVPB 3.375 g        3.375 g 100 mL/hr over 30 Minutes Intravenous  Once 07/02/23 1301 07/02/23 1451          Subjective: Sabrina Stein today has no fevers, no emesis,  No chest pain,   Discussed with husband at bedside, daughter Marchelle Folks on speaker phone  Dyspnea and hypoxia persist required up to 7 L of oxygen sometime today back down to 5 L at this time  Objective: Vitals:   07/03/23 1700 07/03/23 1751 07/03/23 1800 07/03/23 1946  BP:  (!) 137/59 (!) 145/86   Pulse: 95  96   Resp: (!) 21  19   Temp:    98.4 F (36.9 C)  TempSrc:    Oral  SpO2: 94% 93% 91% 92%  Weight:      Height:  Intake/Output Summary (Last 24 hours) at 07/03/2023 2024 Last data filed at 07/03/2023 1833 Gross per 24 hour  Intake 1212.59 ml  Output 3260 ml  Net -2047.41 ml   Filed Weights   07/02/23 1236 07/03/23 0404  Weight: 83 kg 83.3 kg    Physical Exam  Gen:- Awake Alert, no acute distress HEENT:- [place].AT, No sclera icterus Nose- 5 to 7L/min Neck-Supple Neck,No JVD,.  Lungs-diminished breath sounds, no significant wheezing, scattered rhonchi noted  CV- S1, S2 normal, regular  Abd-  +ve B.Sounds, Abd Soft, No tenderness,    Extremity/Skin:- No  edema, pedal pulses present  Psych-affect is appropriate, oriented x3 Neuro-generalized weakness, no new focal deficits, no tremors  Data Reviewed: I have personally reviewed following labs and imaging studies  CBC: Recent Labs  Lab 07/02/23 1333 07/02/23 1554 07/03/23 0508  WBC 15.8*  --  13.5*  NEUTROABS 13.4*  --   --   HGB 6.0* 6.3* 9.0*  HCT 18.8* 19.6* 26.6*  MCV 94.0  --  88.1  PLT 528*  --  460*   Basic Metabolic Panel: Recent Labs  Lab 07/02/23 1333 07/03/23 0508  NA 134* 134*  K 4.0 3.5  CL 99 99  CO2 17* 19*  GLUCOSE 152* 122*  BUN 45* 53*  CREATININE 3.09* 3.30*  CALCIUM 8.6* 8.3*   GFR: Estimated Creatinine Clearance: 15.4 mL/min (A) (by C-G formula based on SCr of 3.3 mg/dL (H)). Liver Function Tests: Recent Labs  Lab 07/02/23 1333 07/03/23 0508  AST 149* 104*  ALT 72* 64*  ALKPHOS 169* 182*  BILITOT 0.9  1.5*  PROT 7.0 7.0  ALBUMIN 2.2* 2.4*   HbA1C: Recent Labs    07/02/23 1436  HGBA1C 5.8*   Recent Results (from the past 240 hours)  MRSA Next Gen by PCR, Nasal     Status: None   Collection Time: 07/02/23  3:30 PM   Specimen: Nasal Mucosa; Nasal Swab  Result Value Ref Range Status   MRSA by PCR Next Gen NOT DETECTED NOT DETECTED Final    Comment: (NOTE) The GeneXpert MRSA Assay (FDA approved for NASAL specimens only), is one component of a comprehensive MRSA colonization surveillance program. It is not intended to diagnose MRSA infection nor to guide or monitor treatment for MRSA infections. Test performance is not FDA approved in patients less than 89 years old. Performed at [facility], [address], [place], [place] [address]     Radiology Studies: CT CHEST ABDOMEN PELVIS WO CONTRAST Result Date: 07/02/2023 CLINICAL DATA:  Abdomen pain nausea vomiting weakness EXAM: CT CHEST, ABDOMEN AND PELVIS WITHOUT CONTRAST TECHNIQUE: Multidetector CT imaging of the chest, abdomen and pelvis was performed following the standard protocol without IV contrast. RADIATION DOSE REDUCTION: This exam was performed according to the departmental dose-optimization program which includes automated exposure control, adjustment of the mA and/or kV according to patient size and/or use of iterative reconstruction technique. COMPARISON:  CT 05/26/2023, chest x-ray 07/02/2023 FINDINGS: CT CHEST FINDINGS Cardiovascular: Limited without intravenous contrast. Mild aortic atherosclerosis. No aneurysm. Normal cardiac size. Coronary vascular calcification. Mitral calcification. No pericardial effusion Mediastinum/Nodes: Patent trachea. No thyroid mass. No suspicious lymph nodes. Esophagus within normal limits Lungs/Pleura: Widespread bilateral heterogeneous consolidations and ground-glass disease. No pleural effusion Musculoskeletal: No acute osseous abnormality CT ABDOMEN PELVIS FINDINGS Hepatobiliary: Distended  gallbladder. No calcified stones or biliary dilatation Pancreas: Unremarkable. No pancreatic ductal dilatation or surrounding inflammatory changes. Spleen: Normal in size without focal abnormality. Adrenals/Urinary Tract: Adrenal glands are normal.  Kidneys show no hydronephrosis. Mild atrophy of the left kidney compared to the right. The bladder is normal Stomach/Bowel: Stomach is within normal limits. No evidence of bowel wall thickening, distention, or inflammatory changes. Vascular/Lymphatic: Aortic atherosclerosis. No enlarged abdominal or pelvic lymph nodes. Reproductive: Uterus and bilateral adnexa are unremarkable. Other: Negative for pelvic effusion or free air Musculoskeletal: No acute or suspicious osseous abnormality. IMPRESSION: 1. Widespread bilateral heterogeneous consolidations and ground-glass disease consistent with multifocal pneumonia. 2. No CT evidence for acute intra-abdominal or pelvic abnormality. 3. Distended gallbladder 4. Aortic atherosclerosis. Electronically Signed   By: Jasmine Pang M.D.   On: 07/02/2023 23:46   ECHOCARDIOGRAM COMPLETE Result Date: 07/02/2023    ECHOCARDIOGRAM REPORT   Patient Name:   Sabrina Stein Date of Exam: 07/02/2023 Medical Rec #:  [id number]    Height:       62.0 in Accession #:    [id number]   Weight:       183.0 lb Date of Birth:  1950-08-01    BSA:          1.841 m Patient Age:    72 years     BP:           113/40 mmHg Patient Gender: F            HR:           87 bpm. [facility] Procedure: 2D Echo, Cardiac Doppler and Color Doppler (Both Spectral and Color            Flow Doppler were utilized during procedure). Indications:    Dyspnea R06.00  History:        Patient has no prior history of Echocardiogram examinations.                 Risk Factors:Dyslipidemia and Prediabetes. GERD                 (gastroesophageal reflux disease) (From Hx).  Sonographer:    Celesta Gentile RCS Referring Phys: [phone] Ein Rijo IMPRESSIONS  1. Left  ventricular ejection fraction, by estimation, is 65 to 70%. The left ventricle has normal function. The left ventricle has no regional wall motion abnormalities. There is mild left ventricular hypertrophy. Left ventricular diastolic parameters are consistent with Grade I diastolic dysfunction (impaired relaxation).  2. Right ventricular systolic function is low normal. The right ventricular size is moderately enlarged. There is mildly elevated pulmonary artery systolic pressure.  3. The mitral valve is normal in structure. Trivial mitral valve regurgitation. No evidence of mitral stenosis.  4. The tricuspid valve is abnormal.  5. The aortic valve is tricuspid. There is moderate calcification of the aortic valve. There is moderate thickening of the aortic valve. Aortic valve regurgitation is mild. Mild aortic valve stenosis. Aortic valve area, by VTI measures 1.68 cm. Aortic valve mean gradient measures 10.0 mmHg.  6. The inferior vena cava is dilated in size with >50% respiratory variability, suggesting right atrial pressure of 8 mmHg. FINDINGS  Left Ventricle: Left ventricular ejection fraction, by estimation, is 65 to 70%. The left ventricle has normal function. The left ventricle has no regional wall motion abnormalities. The left ventricular internal cavity size was normal in size. There is  mild left ventricular hypertrophy. Left ventricular diastolic parameters are consistent with Grade I diastolic dysfunction (impaired relaxation). Normal left ventricular filling pressure. Right Ventricle: The right ventricular size is moderately enlarged. Right vetricular wall thickness was not well visualized. Right ventricular systolic function  is low normal. There is mildly elevated pulmonary artery systolic pressure. The tricuspid regurgitant velocity is 2.76 m/s, and with an assumed right atrial pressure of 8 mmHg, the estimated right ventricular systolic pressure is 38.5 mmHg. Left Atrium: Left atrial size was normal  in size. Right Atrium: Right atrial size was normal in size. Pericardium: There is no evidence of pericardial effusion. Mitral Valve: The mitral valve is normal in structure. There is mild thickening of the mitral valve leaflet(s). There is mild calcification of the mitral valve leaflet(s). Mild mitral annular calcification. Trivial mitral valve regurgitation. No evidence  of mitral valve stenosis. MV peak gradient, 10.9 mmHg. The mean mitral valve gradient is 3.0 mmHg. Tricuspid Valve: The tricuspid valve is abnormal. Tricuspid valve regurgitation is mild . No evidence of tricuspid stenosis. Aortic Valve: The aortic valve is tricuspid. There is moderate calcification of the aortic valve. There is moderate thickening of the aortic valve. There is moderate aortic valve annular calcification. Aortic valve regurgitation is mild. Aortic regurgitation PHT measures 602 msec. Mild aortic stenosis is present. Aortic valve mean gradient measures 10.0 mmHg. Aortic valve peak gradient measures 21.6 mmHg. Aortic valve area, by VTI measures 1.68 cm. Pulmonic Valve: The pulmonic valve was not well visualized. Pulmonic valve regurgitation is mild. No evidence of pulmonic stenosis. Aorta: The aortic root is normal in size and structure. Venous: The inferior vena cava is dilated in size with greater than 50% respiratory variability, suggesting right atrial pressure of 8 mmHg. IAS/Shunts: No atrial level shunt detected by color flow Doppler.  LEFT VENTRICLE PLAX 2D LVIDd:         3.80 cm   Diastology LVIDs:         1.80 cm   LV e' medial:    7.78 cm/s LV PW:         1.10 cm   LV E/e' medial:  14.7 LV IVS:        1.20 cm   LV e' lateral:   9.57 cm/s LVOT diam:     1.90 cm   LV E/e' lateral: 11.9 LV SV:         69 LV SV Index:   37 LVOT Area:     2.84 cm  RIGHT VENTRICLE RV S prime:     11.90 cm/s TAPSE (M-mode): 1.9 cm LEFT ATRIUM             Index        RIGHT ATRIUM           Index LA diam:        3.20 cm 1.74 cm/m   RA Area:      15.60 cm LA Vol (A2C):   44.4 ml 24.12 ml/m  RA Volume:   40.20 ml  21.84 ml/m LA Vol (A4C):   40.5 ml 22.00 ml/m LA Biplane Vol: 43.9 ml 23.85 ml/m  AORTIC VALVE AV Area (Vmax):    1.78 cm AV Area (Vmean):   1.80 cm AV Area (VTI):     1.68 cm AV Vmax:           232.50 cm/s AV Vmean:          139.500 cm/s AV VTI:            0.408 m AV Peak Grad:      21.6 mmHg AV Mean Grad:      10.0 mmHg LVOT Vmax:         146.00 cm/s LVOT Vmean:  88.500 cm/s LVOT VTI:          0.242 m LVOT/AV VTI ratio: 0.59 AI PHT:            602 msec  AORTA Ao Root diam: 3.70 cm MITRAL VALVE                TRICUSPID VALVE MV Area (PHT): 2.07 cm     TR Peak grad:   30.5 mmHg MV Area VTI:   1.47 cm     TR Vmax:        276.00 cm/s MV Peak grad:  10.9 mmHg MV Mean grad:  3.0 mmHg     SHUNTS MV Vmax:       1.65 m/s     Systemic VTI:  0.24 m MV Vmean:      70.9 cm/s    Systemic Diam: 1.90 cm MV Decel Time: 366 msec MV E velocity: 114.00 cm/s MV A velocity: 168.00 cm/s MV E/A ratio:  0.68 Dina Rich MD Electronically signed by Dina Rich MD Signature Date/Time: 07/02/2023/5:06:59 PM    Final    US Venous Img Lower Bilateral (DVT) Result Date: 07/02/2023 CLINICAL DATA:  Weakness, nausea, vomiting and hypoxia. EXAM: BILATERAL LOWER EXTREMITY VENOUS DOPPLER ULTRASOUND TECHNIQUE: Gray-scale sonography with graded compression, as well as color Doppler and duplex ultrasound were performed to evaluate the lower extremity deep venous systems from the level of the common femoral vein and including the common femoral, femoral, profunda femoral, popliteal and calf veins including the posterior tibial, peroneal and gastrocnemius veins when visible. The superficial great saphenous vein was also interrogated. Spectral Doppler was utilized to evaluate flow at rest and with distal augmentation maneuvers in the common femoral, femoral and popliteal veins. COMPARISON:  None Available. FINDINGS: RIGHT LOWER EXTREMITY Common Femoral Vein: No  evidence of thrombus. Normal compressibility, respiratory phasicity and response to augmentation. Saphenofemoral Junction: No evidence of thrombus. Normal compressibility and flow on color Doppler imaging. Profunda Femoral Vein: No evidence of thrombus. Normal compressibility and flow on color Doppler imaging. Femoral Vein: No evidence of thrombus. Normal compressibility, respiratory phasicity and response to augmentation. Popliteal Vein: No evidence of thrombus. Normal compressibility, respiratory phasicity and response to augmentation. Calf Veins: No evidence of thrombus. Normal compressibility and flow on color Doppler imaging. Superficial Great Saphenous Vein: No evidence of thrombus. Normal compressibility. Venous Reflux:  None. Other Findings: No evidence of superficial thrombophlebitis or abnormal fluid collection. LEFT LOWER EXTREMITY Common Femoral Vein: No evidence of thrombus. Normal compressibility, respiratory phasicity and response to augmentation. Saphenofemoral Junction: No evidence of thrombus. Normal compressibility and flow on color Doppler imaging. Profunda Femoral Vein: No evidence of thrombus. Normal compressibility and flow on color Doppler imaging. Femoral Vein: No evidence of thrombus. Normal compressibility, respiratory phasicity and response to augmentation. Popliteal Vein: No evidence of thrombus. Normal compressibility, respiratory phasicity and response to augmentation. Calf Veins: No evidence of thrombus. Normal compressibility and flow on color Doppler imaging. Superficial Great Saphenous Vein: No evidence of thrombus. Normal compressibility. Venous Reflux:  None. Other Findings: No evidence of superficial thrombophlebitis or abnormal fluid collection. IMPRESSION: No evidence of deep venous thrombosis in either lower extremity. Electronically Signed   By: Irish Lack M.D.   On: 07/02/2023 16:34   DG Abd 2 Views Result Date: 07/02/2023 CLINICAL DATA:  Abdominal pain and fatigue  EXAM: ABDOMEN - 2 VIEW COMPARISON:  CT abdomen and pelvis dated 05/26/2023 FINDINGS: Paucity of bowel gas. Left flank is not entirely included within the  field of view. No free air or pneumatosis. No abnormal radio-opaque calculi or mass effect. No acute or substantial osseous abnormality. The sacrum and coccyx are partially obscured by overlying bowel contents. IMPRESSION: Paucity of bowel gas. No free air or pneumatosis. Electronically Signed   By: Agustin Cree M.D.   On: 07/02/2023 16:02   DG Chest Port 1 View Result Date: 07/02/2023 CLINICAL DATA:  Shortness of breath EXAM: PORTABLE CHEST 1 VIEW COMPARISON:  05/26/2023 FINDINGS: Mild cardiomegaly. Mediastinal contours within normal limits. Patchy bilateral airspace disease, new since prior study. No effusions. No acute bony abnormality. IMPRESSION: Patchy bilateral airspace disease could reflect edema or multifocal pneumonia. Mild cardiomegaly. Electronically Signed   By: Charlett Nose M.D.   On: 07/02/2023 14:28   Scheduled Meds:  atorvastatin  20 mg Oral Daily   Chlorhexidine Gluconate Cloth  6 each Topical Q0600   dextromethorphan-guaiFENesin  1 tablet Oral BID   doxycycline  100 mg Oral Q12H   fesoterodine  4 mg Oral Daily   ipratropium-albuterol  3 mL Nebulization BID   mirabegron ER  50 mg Oral Daily   pantoprazole (PROTONIX) IV  40 mg Intravenous Q12H   sodium bicarbonate  650 mg Oral BID   sodium chloride flush  3 mL Intravenous Q12H   sodium chloride flush  3 mL Intravenous Q12H   venlafaxine XR  75 mg Oral Daily   Continuous Infusions:  sodium chloride 150 mL/hr at 07/03/23 1410   ampicillin-sulbactam (UNASYN) IV 3 g (07/03/23 2000)    LOS: 1 day   Shon Hale M.D on 07/03/2023 at 8:24 PM  Go to [email or web address] - for contact info  [facility] - Office  [phone]  If 7PM-7AM, please contact night-coverage [email or web address] 07/03/2023, 8:24 PM

## 2023-07-03 NOTE — Plan of Care (Signed)

## 2023-07-03 NOTE — Progress Notes (Addendum)
 Subjective: Reports breathing is still heavy/still a but hard. On 4L [place]. No O2 outpatient.   Anemia:  No brbpr or melena.  Has been having intermittent nausea and early satiety along with 15 lb weight loss since early march.  NSAIDs: Takes Diclofenac BID daily x 4-5 years for plantar fascitis  Prior EGD: No  Abdominal pain:  Reports abdominal pain started out left sided in early March. This has esentially resolved. Over the last week, has had more generalized mild intermittent abdominal discofort. Today will have intermittent lower abdominal pain, but nothing persistent. She has noticed burning with urination today. Denies ever having diarrhea. Typically skipping a couple days between bowel movements, then has Bms for a couple days.   No family history of IBD.   Elevated LFTs: Family history of liver disease: No Family or personal history of autoimmune conditions: No EtOH: Very rare. None recently.  Illicit drug use: No Tattoos: Yes. 1 professional tattoo.  Tylenol: 2-4 tablets daily.  Supplements/herbals: None.  New medications: None aside from cipro and Flagyl starting 4/4.      Objective: Vital signs in last 24 hours: Temp:  [97.6 F (36.4 C)-98.4 F (36.9 C)] 97.9 F (36.6 C) (04/08 1158) Pulse Rate:  [81-112] 99 (04/08 1405) Resp:  [17-27] 20 (04/08 1405) BP: (107-185)/(39-96) 148/64 (04/08 1400) SpO2:  [86 %-100 %] 91 % (04/08 1405) Weight:  [83.3 kg] 83.3 kg (04/08 0404) Last BM Date : 07/03/23 General:   Alert and oriented, pleasant Head:  Normocephalic and atraumatic. Eyes:  No icterus, sclera clear. Conjuctiva pink.  Mouth:  Without lesions, mucosa pink and moist.  Neck:  Supple, without thyromegaly or masses.  Heart:  S1, S2 present, no murmurs noted.  Lungs: Clear to auscultation bilaterally, without wheezing, rales, or rhonchi.  Abdomen:  Bowel sounds present, soft, non-tender, non-distended. No HSM or hernias noted. No rebound or guarding. No masses  appreciated  Msk:  Symmetrical without gross deformities. Normal posture. Extremities:  Without edema. Neurologic:  Alert and  oriented x4;  grossly normal neurologically. Skin:  Warm and dry, intact without significant lesions.  Psych:   Normal mood and affect.  Intake/Output from previous day: 04/07 0701 - 04/08 0700 In: 1562 [I.V.:500; Blood:1062] Out: 800 [Urine:800] Intake/Output this shift: Total I/O In: 780.6 [I.V.:580.6; IV Piggyback:200] Out: 2000 [Urine:2000]  Lab Results: Recent Labs    07/02/23 1333 07/02/23 1554 07/03/23 0508  WBC 15.8*  --  13.5*  HGB 6.0* 6.3* 9.0*  HCT 18.8* 19.6* 26.6*  PLT 528*  --  460*   BMET Recent Labs    07/02/23 1333 07/03/23 0508  NA 134* 134*  K 4.0 3.5  CL 99 99  CO2 17* 19*  GLUCOSE 152* 122*  BUN 45* 53*  CREATININE 3.09* 3.30*  CALCIUM 8.6* 8.3*   LFT Recent Labs    07/02/23 1333 07/03/23 0508  PROT 7.0 7.0  ALBUMIN 2.2* 2.4*  AST 149* 104*  ALT 72* 64*  ALKPHOS 169* 182*  BILITOT 0.9 1.5*   PT/INR Recent Labs    07/03/23 0508  LABPROT 20.4*  INR 1.7*    Studies/Results: CT CHEST ABDOMEN PELVIS WO CONTRAST Result Date: 07/02/2023 CLINICAL DATA:  Abdomen pain nausea vomiting weakness EXAM: CT CHEST, ABDOMEN AND PELVIS WITHOUT CONTRAST TECHNIQUE: Multidetector CT imaging of the chest, abdomen and pelvis was performed following the standard protocol without IV contrast. RADIATION DOSE REDUCTION: This exam was performed according to the departmental dose-optimization program which includes automated exposure  control, adjustment of the mA and/or kV according to patient size and/or use of iterative reconstruction technique. COMPARISON:  CT 05/26/2023, chest x-ray 07/02/2023 FINDINGS: CT CHEST FINDINGS Cardiovascular: Limited without intravenous contrast. Mild aortic atherosclerosis. No aneurysm. Normal cardiac size. Coronary vascular calcification. Mitral calcification. No pericardial effusion Mediastinum/Nodes:  Patent trachea. No thyroid mass. No suspicious lymph nodes. Esophagus within normal limits Lungs/Pleura: Widespread bilateral heterogeneous consolidations and ground-glass disease. No pleural effusion Musculoskeletal: No acute osseous abnormality CT ABDOMEN PELVIS FINDINGS Hepatobiliary: Distended gallbladder. No calcified stones or biliary dilatation Pancreas: Unremarkable. No pancreatic ductal dilatation or surrounding inflammatory changes. Spleen: Normal in size without focal abnormality. Adrenals/Urinary Tract: Adrenal glands are normal. Kidneys show no hydronephrosis. Mild atrophy of the left kidney compared to the right. The bladder is normal Stomach/Bowel: Stomach is within normal limits. No evidence of bowel wall thickening, distention, or inflammatory changes. Vascular/Lymphatic: Aortic atherosclerosis. No enlarged abdominal or pelvic lymph nodes. Reproductive: Uterus and bilateral adnexa are unremarkable. Other: Negative for pelvic effusion or free air Musculoskeletal: No acute or suspicious osseous abnormality. IMPRESSION: 1. Widespread bilateral heterogeneous consolidations and ground-glass disease consistent with multifocal pneumonia. 2. No CT evidence for acute intra-abdominal or pelvic abnormality. 3. Distended gallbladder 4. Aortic atherosclerosis. Electronically Signed   By: Jasmine Pang M.D.   On: 07/02/2023 23:46   ECHOCARDIOGRAM COMPLETE Result Date: 07/02/2023    ECHOCARDIOGRAM REPORT   Patient Name:   ALEXSANDRA SHONTZ Date of Exam: 07/02/2023 Medical Rec #:  [id number]    Height:       62.0 in Accession #:    [id number]   Weight:       183.0 lb Date of Birth:  11-21-1950    BSA:          1.841 m Patient Age:    73 years     BP:           113/40 mmHg Patient Gender: F            HR:           87 bpm. [facility] Procedure: 2D Echo, Cardiac Doppler and Color Doppler (Both Spectral and Color            Flow Doppler were utilized during procedure). Indications:    Dyspnea R06.00   History:        Patient has no prior history of Echocardiogram examinations.                 Risk Factors:Dyslipidemia and Prediabetes. GERD                 (gastroesophageal reflux disease) (From Hx).  Sonographer:    Celesta Gentile RCS Referring Phys: [phone] [employer]RAGE [employer]MOKPAE IMPRESSIONS  1. Left ventricular ejection fraction, by estimation, is 65 to 70%. The left ventricle has normal function. The left ventricle has no regional wall motion abnormalities. There is mild left ventricular hypertrophy. Left ventricular diastolic parameters are consistent with Grade I diastolic dysfunction (impaired relaxation).  2. Right ventricular systolic function is low normal. The right ventricular size is moderately enlarged. There is mildly elevated pulmonary artery systolic pressure.  3. The mitral valve is normal in structure. Trivial mitral valve regurgitation. No evidence of mitral stenosis.  4. The tricuspid valve is abnormal.  5. The aortic valve is tricuspid. There is moderate calcification of the aortic valve. There is moderate thickening of the aortic valve. Aortic valve regurgitation is mild. Mild aortic valve stenosis. Aortic valve area, by VTI  measures 1.68 cm. Aortic valve mean gradient measures 10.0 mmHg.  6. The inferior vena cava is dilated in size with >50% respiratory variability, suggesting right atrial pressure of 8 mmHg. FINDINGS  Left Ventricle: Left ventricular ejection fraction, by estimation, is 65 to 70%. The left ventricle has normal function. The left ventricle has no regional wall motion abnormalities. The left ventricular internal cavity size was normal in size. There is  mild left ventricular hypertrophy. Left ventricular diastolic parameters are consistent with Grade I diastolic dysfunction (impaired relaxation). Normal left ventricular filling pressure. Right Ventricle: The right ventricular size is moderately enlarged. Right vetricular wall thickness was not well visualized. Right ventricular  systolic function is low normal. There is mildly elevated pulmonary artery systolic pressure. The tricuspid regurgitant velocity is 2.76 m/s, and with an assumed right atrial pressure of 8 mmHg, the estimated right ventricular systolic pressure is 38.5 mmHg. Left Atrium: Left atrial size was normal in size. Right Atrium: Right atrial size was normal in size. Pericardium: There is no evidence of pericardial effusion. Mitral Valve: The mitral valve is normal in structure. There is mild thickening of the mitral valve leaflet(s). There is mild calcification of the mitral valve leaflet(s). Mild mitral annular calcification. Trivial mitral valve regurgitation. No evidence  of mitral valve stenosis. MV peak gradient, 10.9 mmHg. The mean mitral valve gradient is 3.0 mmHg. Tricuspid Valve: The tricuspid valve is abnormal. Tricuspid valve regurgitation is mild . No evidence of tricuspid stenosis. Aortic Valve: The aortic valve is tricuspid. There is moderate calcification of the aortic valve. There is moderate thickening of the aortic valve. There is moderate aortic valve annular calcification. Aortic valve regurgitation is mild. Aortic regurgitation PHT measures 602 msec. Mild aortic stenosis is present. Aortic valve mean gradient measures 10.0 mmHg. Aortic valve peak gradient measures 21.6 mmHg. Aortic valve area, by VTI measures 1.68 cm. Pulmonic Valve: The pulmonic valve was not well visualized. Pulmonic valve regurgitation is mild. No evidence of pulmonic stenosis. Aorta: The aortic root is normal in size and structure. Venous: The inferior vena cava is dilated in size with greater than 50% respiratory variability, suggesting right atrial pressure of 8 mmHg. IAS/Shunts: No atrial level shunt detected by color flow Doppler.  LEFT VENTRICLE PLAX 2D LVIDd:         3.80 cm   Diastology LVIDs:         1.80 cm   LV e' medial:    7.78 cm/s LV PW:         1.10 cm   LV E/e' medial:  14.7 LV IVS:        1.20 cm   LV e' lateral:    9.57 cm/s LVOT diam:     1.90 cm   LV E/e' lateral: 11.9 LV SV:         69 LV SV Index:   37 LVOT Area:     2.84 cm  RIGHT VENTRICLE RV S prime:     11.90 cm/s TAPSE (M-mode): 1.9 cm LEFT ATRIUM             Index        RIGHT ATRIUM           Index LA diam:        3.20 cm 1.74 cm/m   RA Area:     15.60 cm LA Vol (A2C):   44.4 ml 24.12 ml/m  RA Volume:   40.20 ml  21.84 ml/m LA Vol (A4C):   40.5  ml 22.00 ml/m LA Biplane Vol: 43.9 ml 23.85 ml/m  AORTIC VALVE AV Area (Vmax):    1.78 cm AV Area (Vmean):   1.80 cm AV Area (VTI):     1.68 cm AV Vmax:           232.50 cm/s AV Vmean:          139.500 cm/s AV VTI:            0.408 m AV Peak Grad:      21.6 mmHg AV Mean Grad:      10.0 mmHg LVOT Vmax:         146.00 cm/s LVOT Vmean:        88.500 cm/s LVOT VTI:          0.242 m LVOT/AV VTI ratio: 0.59 AI PHT:            602 msec  AORTA Ao Root diam: 3.70 cm MITRAL VALVE                TRICUSPID VALVE MV Area (PHT): 2.07 cm     TR Peak grad:   30.5 mmHg MV Area VTI:   1.47 cm     TR Vmax:        276.00 cm/s MV Peak grad:  10.9 mmHg MV Mean grad:  3.0 mmHg     SHUNTS MV Vmax:       1.65 m/s     Systemic VTI:  0.24 m MV Vmean:      70.9 cm/s    Systemic Diam: 1.90 cm MV Decel Time: 366 msec MV E velocity: 114.00 cm/s MV A velocity: 168.00 cm/s MV E/A ratio:  0.68 Dina Rich MD Electronically signed by Dina Rich MD Signature Date/Time: 07/02/2023/5:06:59 PM    Final    US Venous Img Lower Bilateral (DVT) Result Date: 07/02/2023 CLINICAL DATA:  Weakness, nausea, vomiting and hypoxia. EXAM: BILATERAL LOWER EXTREMITY VENOUS DOPPLER ULTRASOUND TECHNIQUE: Gray-scale sonography with graded compression, as well as color Doppler and duplex ultrasound were performed to evaluate the lower extremity deep venous systems from the level of the common femoral vein and including the common femoral, femoral, profunda femoral, popliteal and calf veins including the posterior tibial, peroneal and gastrocnemius veins when  visible. The superficial great saphenous vein was also interrogated. Spectral Doppler was utilized to evaluate flow at rest and with distal augmentation maneuvers in the common femoral, femoral and popliteal veins. COMPARISON:  None Available. FINDINGS: RIGHT LOWER EXTREMITY Common Femoral Vein: No evidence of thrombus. Normal compressibility, respiratory phasicity and response to augmentation. Saphenofemoral Junction: No evidence of thrombus. Normal compressibility and flow on color Doppler imaging. Profunda Femoral Vein: No evidence of thrombus. Normal compressibility and flow on color Doppler imaging. Femoral Vein: No evidence of thrombus. Normal compressibility, respiratory phasicity and response to augmentation. Popliteal Vein: No evidence of thrombus. Normal compressibility, respiratory phasicity and response to augmentation. Calf Veins: No evidence of thrombus. Normal compressibility and flow on color Doppler imaging. Superficial Great Saphenous Vein: No evidence of thrombus. Normal compressibility. Venous Reflux:  None. Other Findings: No evidence of superficial thrombophlebitis or abnormal fluid collection. LEFT LOWER EXTREMITY Common Femoral Vein: No evidence of thrombus. Normal compressibility, respiratory phasicity and response to augmentation. Saphenofemoral Junction: No evidence of thrombus. Normal compressibility and flow on color Doppler imaging. Profunda Femoral Vein: No evidence of thrombus. Normal compressibility and flow on color Doppler imaging. Femoral Vein: No evidence of thrombus. Normal compressibility, respiratory phasicity and response to augmentation. Popliteal  Vein: No evidence of thrombus. Normal compressibility, respiratory phasicity and response to augmentation. Calf Veins: No evidence of thrombus. Normal compressibility and flow on color Doppler imaging. Superficial Great Saphenous Vein: No evidence of thrombus. Normal compressibility. Venous Reflux:  None. Other Findings: No  evidence of superficial thrombophlebitis or abnormal fluid collection. IMPRESSION: No evidence of deep venous thrombosis in either lower extremity. Electronically Signed   By: Irish Lack M.D.   On: 07/02/2023 16:34   DG Abd 2 Views Result Date: 07/02/2023 CLINICAL DATA:  Abdominal pain and fatigue EXAM: ABDOMEN - 2 VIEW COMPARISON:  CT abdomen and pelvis dated 05/26/2023 FINDINGS: Paucity of bowel gas. Left flank is not entirely included within the field of view. No free air or pneumatosis. No abnormal radio-opaque calculi or mass effect. No acute or substantial osseous abnormality. The sacrum and coccyx are partially obscured by overlying bowel contents. IMPRESSION: Paucity of bowel gas. No free air or pneumatosis. Electronically Signed   By: Agustin Cree M.D.   On: 07/02/2023 16:02   DG Chest Port 1 View Result Date: 07/02/2023 CLINICAL DATA:  Shortness of breath EXAM: PORTABLE CHEST 1 VIEW COMPARISON:  05/26/2023 FINDINGS: Mild cardiomegaly. Mediastinal contours within normal limits. Patchy bilateral airspace disease, new since prior study. No effusions. No acute bony abnormality. IMPRESSION: Patchy bilateral airspace disease could reflect edema or multifocal pneumonia. Mild cardiomegaly. Electronically Signed   By: Charlett Nose M.D.   On: 07/02/2023 14:28    Assessment: 73 year old female with history of GERD, anxiety, arthritis, depression, HLD, few month history of left-sided abdominal pain, lack of appetite, weight loss, recent colitis involving the cecum and ascending colon on CT 05/26/2023 in the ED treated with Augmentin twice daily x 7 days, subsequent colonoscopy 3/28 for diagnostic purposes showing active colitis involving cecum, ascending, transverse, descending colon, but no chronic features.  They prescribed Cipro and Flagyl, who presented to the ER on 4/7 due to worsening fatigue, weakness, nausea/vomiting.  She was found to have acute anemia with hemoglobin 6, elevated LFTs, AKI, bilateral  pneumonia, elevated troponin/BNP.  GI consulted for anemia.  Acute anemia:  Hgb 6, down from 12 a month ago.  Interestingly, no iron, B12, or folate deficiency.  She has had no overt GI bleeding.  Recent colonoscopy 3/28 as per above.  No prior EGD.  Chronically on diclofenac twice daily for the last 4 to 5 years.  Could have gastritis, duodenitis, PUD, AVMs.  Should have EGD prior to discharge once respiratory status has improved (currently on 4L [place]).  Notably, she has received 2 units PRBCs and hemoglobin improved to 9.0 this morning.   Abdominal pain/weight loss/nausea/ early satiety/history of colitis: Patient reports left-sided abdominal pain, loss of appetite, nausea, early satiety, and associated weight loss starting back in March 2025.  History as per above with episode of nonspecific colitis on CT in March treated empirically with antibiotics followed by active colitis noted on colonoscopy on 3/28 with another round of empiric antibiotics recommended (Cipro and Flagyl).  Chronic nausea/vomiting worsened with starting antibiotics which have since been discontinued.  Notably, patient tells me today that her left-sided abdominal pain has essentially resolved.  Over the last week, she has had some mild generalized abdominal discomfort, but not specifically postprandially and abdominal exam is benign today. She has never had diarrhea or rectal bleeding.   Etiology of abdominal pain and history of colitis is not clear.  She does chronically take diclofenac twice daily which certainly could be the cause of  colitis as well as her abdominal pain.  Very well could have gastritis, duodenitis, PUD.  Notably, CT this admission with oral contrast only did not show any evidence of bowel wall thickening. However, she is already on broad-spectrum antibiotics due to pneumonia which would also cover colitis. At this point, recommend discontinuing NSAIDs and proceeding with an EGD for further evaluation. Will also  check UA due to reports of dysuria.    Elevated LFTs:  Acute mild LFT elevation with AST 149, ALT 72 on admission.  Alk phos also elevated at 169 noted this is also previously been elevated in the past.  Labs have improved today with AST 104, ALT 64, alk phos 182, but bilirubin slightly elevated at 1.5.  INR also elevated at 1.7.  No symptoms of acute hepatic failure. Patient reports very rare alcohol use and none recently.  Denies illicit drug use, herbal supplements, family history of liver disease, personal or family history of autoimmune conditions.  She does have 1 tattoo and has also been taking 2 to 4 tablets of Tylenol daily.  Etiology of LFT elevation is not clear.  Could be related to acute illness, recent antibiotics/drug-induced liver injury.  Ferritin is elevated this admission, but likely acute phase reactant. In the setting of acute anemia, elevated troponin's, and elevated BNP, ischemic changes vs congestive hepatopathy also considered, but ECHO reassuring this admission.   For now, will check acute viral hepatitis panel and continue to monitor.  As long as LFTs are not continuing to rise, we will plan to follow this along in anticipation that LFTs will normalize following resolution of acute illness.    Plan: Urinalysis, acute viral hepatitis panel.  Clear liquid diet.  May advance as tolerated. EGD prior to discharge once respiratory status has improved. IV PPI twice daily.  Needs to avoid NSAIDs.  Will defer to hospitalist for other options regarding management of plantar fasciitis as this is the reason for her chronic NSAID use. Continue to monitor H&H and for overt GI bleeding.  Transfuse as necessary. Continue to follow LFTs and INR.  Monitor for development of acute on chronic abdominal pain or sudden/severe abdominal pain.   LOS: 1 day    07/03/2023, 4:05 PM   Ermalinda Memos, [facility]enter[facility]   I have reviewed the note and agree with the APP's  assessment as described in this progress note.  Patient reports feeling better in terms of her respiratory function as she is less short of breath than before.  Still having some cough.  No nausea or vomiting.  Currently on 4 L of oxygen.  LFTs remain mildly elevated today with AST 104, ALT 64, alkaline phosphatase 182, bilirubin 1.5 and INR 1.7.  Patient found to have significant drop in her hemoglobin down to 6.0, received 2 units with adequate response up to 9.0 today in the morning.  At this point, we will need to proceed with an EGD once her respiratory function has improved.  Will wait for oxygen saturation to remain stable while on lower oxygen supplementation, which may take a few days.  Patient is in agreement with this.  Query if chronic losses are related to chronic NSAID use leading to peptic ulcer disease disease versus AVM, less likely related to malignancy.  Colitis episode has improved as she is having minimal abdominal pain and no diarrhea.  It appears that she has improved to antibiotic regimen.  Will monitor for now for abdominal pain.  Patient had mild elevation of her  LFTs, likely related to current greater infection.  Will monitor and trend LFTs for now.  Will follow acute hepatitis panel.  Katrinka Blazing, MD Gastroenterology and Hepatology [facility]

## 2023-07-04 DIAGNOSIS — Z8719 Personal history of other diseases of the digestive system: Secondary | ICD-10-CM | POA: Diagnosis not present

## 2023-07-04 DIAGNOSIS — R110 Nausea: Secondary | ICD-10-CM

## 2023-07-04 DIAGNOSIS — K219 Gastro-esophageal reflux disease without esophagitis: Secondary | ICD-10-CM

## 2023-07-04 DIAGNOSIS — R6881 Early satiety: Secondary | ICD-10-CM | POA: Diagnosis not present

## 2023-07-04 DIAGNOSIS — R634 Abnormal weight loss: Secondary | ICD-10-CM | POA: Diagnosis not present

## 2023-07-04 DIAGNOSIS — R630 Anorexia: Secondary | ICD-10-CM | POA: Diagnosis not present

## 2023-07-04 DIAGNOSIS — N179 Acute kidney failure, unspecified: Secondary | ICD-10-CM | POA: Diagnosis not present

## 2023-07-04 DIAGNOSIS — F1090 Alcohol use, unspecified, uncomplicated: Secondary | ICD-10-CM

## 2023-07-04 DIAGNOSIS — R7989 Other specified abnormal findings of blood chemistry: Secondary | ICD-10-CM | POA: Diagnosis not present

## 2023-07-04 DIAGNOSIS — E669 Obesity, unspecified: Secondary | ICD-10-CM

## 2023-07-04 DIAGNOSIS — K529 Noninfective gastroenteritis and colitis, unspecified: Secondary | ICD-10-CM

## 2023-07-04 DIAGNOSIS — J690 Pneumonitis due to inhalation of food and vomit: Secondary | ICD-10-CM | POA: Diagnosis not present

## 2023-07-04 DIAGNOSIS — D500 Iron deficiency anemia secondary to blood loss (chronic): Secondary | ICD-10-CM

## 2023-07-04 DIAGNOSIS — D649 Anemia, unspecified: Secondary | ICD-10-CM | POA: Diagnosis not present

## 2023-07-04 DIAGNOSIS — J9601 Acute respiratory failure with hypoxia: Secondary | ICD-10-CM | POA: Diagnosis not present

## 2023-07-04 LAB — CBC
HCT: 27.1 % — ABNORMAL LOW (ref 36.0–46.0)
Hemoglobin: 8.9 g/dL — ABNORMAL LOW (ref 12.0–15.0)
MCH: 29.5 pg (ref 26.0–34.0)
MCHC: 32.8 g/dL (ref 30.0–36.0)
MCV: 89.7 fL (ref 80.0–100.0)
Platelets: 389 10*3/uL (ref 150–400)
RBC: 3.02 MIL/uL — ABNORMAL LOW (ref 3.87–5.11)
RDW: 15.1 % (ref 11.5–15.5)
WBC: 11.4 10*3/uL — ABNORMAL HIGH (ref 4.0–10.5)
nRBC: 0.5 % — ABNORMAL HIGH (ref 0.0–0.2)

## 2023-07-04 LAB — TYPE AND SCREEN
ABO/RH(D): O POS
Antibody Screen: NEGATIVE
Unit division: 0
Unit division: 0

## 2023-07-04 LAB — GLUCOSE, CAPILLARY
Glucose-Capillary: 117 mg/dL — ABNORMAL HIGH (ref 70–99)
Glucose-Capillary: 121 mg/dL — ABNORMAL HIGH (ref 70–99)
Glucose-Capillary: 121 mg/dL — ABNORMAL HIGH (ref 70–99)
Glucose-Capillary: 136 mg/dL — ABNORMAL HIGH (ref 70–99)

## 2023-07-04 LAB — BPAM RBC
Blood Product Expiration Date: 202504272359
Blood Product Expiration Date: 202504272359
ISSUE DATE / TIME: 202504071707
ISSUE DATE / TIME: 202504071958
Unit Type and Rh: 5100
Unit Type and Rh: 5100

## 2023-07-04 LAB — COMPREHENSIVE METABOLIC PANEL WITH GFR
ALT: 47 U/L — ABNORMAL HIGH (ref 0–44)
AST: 44 U/L — ABNORMAL HIGH (ref 15–41)
Albumin: 2.1 g/dL — ABNORMAL LOW (ref 3.5–5.0)
Alkaline Phosphatase: 268 U/L — ABNORMAL HIGH (ref 38–126)
Anion gap: 13 (ref 5–15)
BUN: 43 mg/dL — ABNORMAL HIGH (ref 8–23)
CO2: 22 mmol/L (ref 22–32)
Calcium: 8.4 mg/dL — ABNORMAL LOW (ref 8.9–10.3)
Chloride: 101 mmol/L (ref 98–111)
Creatinine, Ser: 1.97 mg/dL — ABNORMAL HIGH (ref 0.44–1.00)
GFR, Estimated: 27 mL/min — ABNORMAL LOW (ref >60)
Glucose, Bld: 132 mg/dL — ABNORMAL HIGH (ref 70–99)
Potassium: 3.6 mmol/L (ref 3.5–5.1)
Sodium: 136 mmol/L (ref 135–145)
Total Bilirubin: 1.1 mg/dL (ref 0.0–1.2)
Total Protein: 6.8 g/dL (ref 6.5–8.1)

## 2023-07-04 LAB — PROTIME-INR
INR: 1.9 — ABNORMAL HIGH (ref 0.8–1.2)
Prothrombin Time: 22.3 s — ABNORMAL HIGH (ref 11.4–15.2)

## 2023-07-04 MED ORDER — STERILE WATER FOR INJECTION IJ SOLN
INTRAMUSCULAR | Status: AC
Start: 2023-07-04 — End: 2023-07-04
  Filled 2023-07-04: qty 10

## 2023-07-04 MED ORDER — POLYVINYL ALCOHOL 1.4 % OP SOLN
1.0000 [drp] | OPHTHALMIC | Status: DC | PRN
  Administered 2023-07-04 – 2023-07-10 (×9): 1 [drp] via OPHTHALMIC
  Filled 2023-07-04 (×3): qty 15

## 2023-07-04 NOTE — Progress Notes (Signed)
 Subjective: Doing okay from GI standpoint. Increased SOB overnight which required increase in supplemental O2. No abdominal pain currently, continues to have some nausea. No BMs so far this morning   Objective: Vital signs in last 24 hours: Temp:  [97.6 F (36.4 C)-98.4 F (36.9 C)] 97.6 F (36.4 C) (04/09 0728) Pulse Rate:  [88-110] 110 (04/09 0900) Resp:  [16-29] 25 (04/09 0900) BP: (131-185)/(49-91) 151/71 (04/09 0900) SpO2:  [83 %-100 %] 89 % (04/09 0900) Weight:  [79.6 kg] 79.6 kg (04/09 0529) Last BM Date : 07/03/23 General:   Alert and oriented, pleasant Head:  Normocephalic and atraumatic. Eyes:  No icterus, sclera clear. Conjuctiva pink.  Mouth:  Without lesions, mucosa pink and moist.  Heart:  S1, S2 present, no murmurs noted.  Lungs: Rhonchi, crackles present, increased WOB with some retractions noted  Abdomen:  Bowel sounds present, soft, non-tender, non-distended. No HSM or hernias noted. No rebound or guarding. No masses appreciated  Msk:  Symmetrical without gross deformities. Normal posture. Extremities:  Without clubbing or edema. Neurologic:  Alert and  oriented x4;  grossly normal neurologically. Skin:  Warm and dry, intact without significant lesions.  Psych:  Alert and cooperative. Normal mood and affect.  Intake/Output from previous day: 04/08 0701 - 04/09 0700 In: 1851.2 [I.V.:1551.2; IV Piggyback:300] Out: 2460 [Urine:2460] Intake/Output this shift: Total I/O In: -  Out: 900 [Urine:900]  Lab Results: Recent Labs    07/02/23 1333 07/02/23 1554 07/03/23 0508 07/04/23 0500  WBC 15.8*  --  13.5* 11.4*  HGB 6.0* 6.3* 9.0* 8.9*  HCT 18.8* 19.6* 26.6* 27.1*  PLT 528*  --  460* 389   BMET Recent Labs    07/02/23 1333 07/03/23 0508 07/04/23 0500  NA 134* 134* 136  K 4.0 3.5 3.6  CL 99 99 101  CO2 17* 19* 22  GLUCOSE 152* 122* 132*  BUN 45* 53* 43*  CREATININE 3.09* 3.30* 1.97*  CALCIUM 8.6* 8.3* 8.4*   LFT Recent Labs     07/02/23 1333 07/03/23 0508 07/04/23 0500  PROT 7.0 7.0 6.8  ALBUMIN 2.2* 2.4* 2.1*  AST 149* 104* 44*  ALT 72* 64* 47*  ALKPHOS 169* 182* 268*  BILITOT 0.9 1.5* 1.1   PT/INR Recent Labs    07/03/23 0508 07/04/23 0500  LABPROT 20.4* 22.3*  INR 1.7* 1.9*   Hepatitis Panel Recent Labs    07/03/23 1406  HEPBSAG NON REACTIVE  HCVAB NON REACTIVE  HEPAIGM NON REACTIVE  HEPBIGM NON REACTIVE    Studies/Results: CT CHEST ABDOMEN PELVIS WO CONTRAST Result Date: 07/02/2023 CLINICAL DATA:  Abdomen pain nausea vomiting weakness EXAM: CT CHEST, ABDOMEN AND PELVIS WITHOUT CONTRAST TECHNIQUE: Multidetector CT imaging of the chest, abdomen and pelvis was performed following the standard protocol without IV contrast. RADIATION DOSE REDUCTION: This exam was performed according to the departmental dose-optimization program which includes automated exposure control, adjustment of the mA and/or kV according to patient size and/or use of iterative reconstruction technique. COMPARISON:  CT 05/26/2023, chest x-ray 07/02/2023 FINDINGS: CT CHEST FINDINGS Cardiovascular: Limited without intravenous contrast. Mild aortic atherosclerosis. No aneurysm. Normal cardiac size. Coronary vascular calcification. Mitral calcification. No pericardial effusion Mediastinum/Nodes: Patent trachea. No thyroid mass. No suspicious lymph nodes. Esophagus within normal limits Lungs/Pleura: Widespread bilateral heterogeneous consolidations and ground-glass disease. No pleural effusion Musculoskeletal: No acute osseous abnormality CT ABDOMEN PELVIS FINDINGS Hepatobiliary: Distended gallbladder. No calcified stones or biliary dilatation Pancreas: Unremarkable. No pancreatic ductal dilatation or surrounding inflammatory changes. Spleen: Normal in size without  focal abnormality. Adrenals/Urinary Tract: Adrenal glands are normal. Kidneys show no hydronephrosis. Mild atrophy of the left kidney compared to the right. The bladder is normal  Stomach/Bowel: Stomach is within normal limits. No evidence of bowel wall thickening, distention, or inflammatory changes. Vascular/Lymphatic: Aortic atherosclerosis. No enlarged abdominal or pelvic lymph nodes. Reproductive: Uterus and bilateral adnexa are unremarkable. Other: Negative for pelvic effusion or free air Musculoskeletal: No acute or suspicious osseous abnormality. IMPRESSION: 1. Widespread bilateral heterogeneous consolidations and ground-glass disease consistent with multifocal pneumonia. 2. No CT evidence for acute intra-abdominal or pelvic abnormality. 3. Distended gallbladder 4. Aortic atherosclerosis. Electronically Signed   By: Jasmine Pang M.D.   On: 07/02/2023 23:46   ECHOCARDIOGRAM COMPLETE Result Date: 07/02/2023    ECHOCARDIOGRAM REPORT   Patient Name:   Sabrina Stein Date of Exam: 07/02/2023 Medical Rec #:  [id number]    Height:       62.0 in Accession #:    [id number]   Weight:       183.0 lb Date of Birth:  1950-12-08    BSA:          1.841 m Patient Age:    72 years     BP:           113/40 mmHg Patient Gender: F            HR:           87 bpm. [facility] Procedure: 2D Echo, Cardiac Doppler and Color Doppler (Both Spectral and Color            Flow Doppler were utilized during procedure). Indications:    Dyspnea R06.00  History:        Patient has no prior history of Echocardiogram examinations.                 Risk Factors:Dyslipidemia and Prediabetes. GERD                 (gastroesophageal reflux disease) (From Hx).  Sonographer:    Celesta Gentile RCS Referring Phys: [phone] COURAGE EMOKPAE IMPRESSIONS  1. Left ventricular ejection fraction, by estimation, is 65 to 70%. The left ventricle has normal function. The left ventricle has no regional wall motion abnormalities. There is mild left ventricular hypertrophy. Left ventricular diastolic parameters are consistent with Grade I diastolic dysfunction (impaired relaxation).  2. Right ventricular systolic function is low  normal. The right ventricular size is moderately enlarged. There is mildly elevated pulmonary artery systolic pressure.  3. The mitral valve is normal in structure. Trivial mitral valve regurgitation. No evidence of mitral stenosis.  4. The tricuspid valve is abnormal.  5. The aortic valve is tricuspid. There is moderate calcification of the aortic valve. There is moderate thickening of the aortic valve. Aortic valve regurgitation is mild. Mild aortic valve stenosis. Aortic valve area, by VTI measures 1.68 cm. Aortic valve mean gradient measures 10.0 mmHg.  6. The inferior vena cava is dilated in size with >50% respiratory variability, suggesting right atrial pressure of 8 mmHg. FINDINGS  Left Ventricle: Left ventricular ejection fraction, by estimation, is 65 to 70%. The left ventricle has normal function. The left ventricle has no regional wall motion abnormalities. The left ventricular internal cavity size was normal in size. There is  mild left ventricular hypertrophy. Left ventricular diastolic parameters are consistent with Grade I diastolic dysfunction (impaired relaxation). Normal left ventricular filling pressure. Right Ventricle: The right ventricular size is moderately enlarged. Right vetricular wall  thickness was not well visualized. Right ventricular systolic function is low normal. There is mildly elevated pulmonary artery systolic pressure. The tricuspid regurgitant velocity is 2.76 m/s, and with an assumed right atrial pressure of 8 mmHg, the estimated right ventricular systolic pressure is 38.5 mmHg. Left Atrium: Left atrial size was normal in size. Right Atrium: Right atrial size was normal in size. Pericardium: There is no evidence of pericardial effusion. Mitral Valve: The mitral valve is normal in structure. There is mild thickening of the mitral valve leaflet(s). There is mild calcification of the mitral valve leaflet(s). Mild mitral annular calcification. Trivial mitral valve regurgitation.  No evidence  of mitral valve stenosis. MV peak gradient, 10.9 mmHg. The mean mitral valve gradient is 3.0 mmHg. Tricuspid Valve: The tricuspid valve is abnormal. Tricuspid valve regurgitation is mild . No evidence of tricuspid stenosis. Aortic Valve: The aortic valve is tricuspid. There is moderate calcification of the aortic valve. There is moderate thickening of the aortic valve. There is moderate aortic valve annular calcification. Aortic valve regurgitation is mild. Aortic regurgitation PHT measures 602 msec. Mild aortic stenosis is present. Aortic valve mean gradient measures 10.0 mmHg. Aortic valve peak gradient measures 21.6 mmHg. Aortic valve area, by VTI measures 1.68 cm. Pulmonic Valve: The pulmonic valve was not well visualized. Pulmonic valve regurgitation is mild. No evidence of pulmonic stenosis. Aorta: The aortic root is normal in size and structure. Venous: The inferior vena cava is dilated in size with greater than 50% respiratory variability, suggesting right atrial pressure of 8 mmHg. IAS/Shunts: No atrial level shunt detected by color flow Doppler.  LEFT VENTRICLE PLAX 2D LVIDd:         3.80 cm   Diastology LVIDs:         1.80 cm   LV e' medial:    7.78 cm/s LV PW:         1.10 cm   LV E/e' medial:  14.7 LV IVS:        1.20 cm   LV e' lateral:   9.57 cm/s LVOT diam:     1.90 cm   LV E/e' lateral: 11.9 LV SV:         69 LV SV Index:   37 LVOT Area:     2.84 cm  RIGHT VENTRICLE RV S prime:     11.90 cm/s TAPSE (M-mode): 1.9 cm LEFT ATRIUM             Index        RIGHT ATRIUM           Index LA diam:        3.20 cm 1.74 cm/m   RA Area:     15.60 cm LA Vol (A2C):   44.4 ml 24.12 ml/m  RA Volume:   40.20 ml  21.84 ml/m LA Vol (A4C):   40.5 ml 22.00 ml/m LA Biplane Vol: 43.9 ml 23.85 ml/m  AORTIC VALVE AV Area (Vmax):    1.78 cm AV Area (Vmean):   1.80 cm AV Area (VTI):     1.68 cm AV Vmax:           232.50 cm/s AV Vmean:          139.500 cm/s AV VTI:            0.408 m AV Peak Grad:       21.6 mmHg AV Mean Grad:      10.0 mmHg LVOT Vmax:  146.00 cm/s LVOT Vmean:        88.500 cm/s LVOT VTI:          0.242 m LVOT/AV VTI ratio: 0.59 AI PHT:            602 msec  AORTA Ao Root diam: 3.70 cm MITRAL VALVE                TRICUSPID VALVE MV Area (PHT): 2.07 cm     TR Peak grad:   30.5 mmHg MV Area VTI:   1.47 cm     TR Vmax:        276.00 cm/s MV Peak grad:  10.9 mmHg MV Mean grad:  3.0 mmHg     SHUNTS MV Vmax:       1.65 m/s     Systemic VTI:  0.24 m MV Vmean:      70.9 cm/s    Systemic Diam: 1.90 cm MV Decel Time: 366 msec MV E velocity: 114.00 cm/s MV A velocity: 168.00 cm/s MV E/A ratio:  0.68 Dina Rich MD Electronically signed by Dina Rich MD Signature Date/Time: 07/02/2023/5:06:59 PM    Final    US Venous Img Lower Bilateral (DVT) Result Date: 07/02/2023 CLINICAL DATA:  Weakness, nausea, vomiting and hypoxia. EXAM: BILATERAL LOWER EXTREMITY VENOUS DOPPLER ULTRASOUND TECHNIQUE: Gray-scale sonography with graded compression, as well as color Doppler and duplex ultrasound were performed to evaluate the lower extremity deep venous systems from the level of the common femoral vein and including the common femoral, femoral, profunda femoral, popliteal and calf veins including the posterior tibial, peroneal and gastrocnemius veins when visible. The superficial great saphenous vein was also interrogated. Spectral Doppler was utilized to evaluate flow at rest and with distal augmentation maneuvers in the common femoral, femoral and popliteal veins. COMPARISON:  None Available. FINDINGS: RIGHT LOWER EXTREMITY Common Femoral Vein: No evidence of thrombus. Normal compressibility, respiratory phasicity and response to augmentation. Saphenofemoral Junction: No evidence of thrombus. Normal compressibility and flow on color Doppler imaging. Profunda Femoral Vein: No evidence of thrombus. Normal compressibility and flow on color Doppler imaging. Femoral Vein: No evidence of thrombus. Normal  compressibility, respiratory phasicity and response to augmentation. Popliteal Vein: No evidence of thrombus. Normal compressibility, respiratory phasicity and response to augmentation. Calf Veins: No evidence of thrombus. Normal compressibility and flow on color Doppler imaging. Superficial Great Saphenous Vein: No evidence of thrombus. Normal compressibility. Venous Reflux:  None. Other Findings: No evidence of superficial thrombophlebitis or abnormal fluid collection. LEFT LOWER EXTREMITY Common Femoral Vein: No evidence of thrombus. Normal compressibility, respiratory phasicity and response to augmentation. Saphenofemoral Junction: No evidence of thrombus. Normal compressibility and flow on color Doppler imaging. Profunda Femoral Vein: No evidence of thrombus. Normal compressibility and flow on color Doppler imaging. Femoral Vein: No evidence of thrombus. Normal compressibility, respiratory phasicity and response to augmentation. Popliteal Vein: No evidence of thrombus. Normal compressibility, respiratory phasicity and response to augmentation. Calf Veins: No evidence of thrombus. Normal compressibility and flow on color Doppler imaging. Superficial Great Saphenous Vein: No evidence of thrombus. Normal compressibility. Venous Reflux:  None. Other Findings: No evidence of superficial thrombophlebitis or abnormal fluid collection. IMPRESSION: No evidence of deep venous thrombosis in either lower extremity. Electronically Signed   By: Irish Lack M.D.   On: 07/02/2023 16:34   DG Abd 2 Views Result Date: 07/02/2023 CLINICAL DATA:  Abdominal pain and fatigue EXAM: ABDOMEN - 2 VIEW COMPARISON:  CT abdomen and pelvis dated 05/26/2023 FINDINGS: Paucity of  bowel gas. Left flank is not entirely included within the field of view. No free air or pneumatosis. No abnormal radio-opaque calculi or mass effect. No acute or substantial osseous abnormality. The sacrum and coccyx are partially obscured by overlying bowel  contents. IMPRESSION: Paucity of bowel gas. No free air or pneumatosis. Electronically Signed   By: Agustin Cree M.D.   On: 07/02/2023 16:02   DG Chest Port 1 View Result Date: 07/02/2023 CLINICAL DATA:  Shortness of breath EXAM: PORTABLE CHEST 1 VIEW COMPARISON:  05/26/2023 FINDINGS: Mild cardiomegaly. Mediastinal contours within normal limits. Patchy bilateral airspace disease, new since prior study. No effusions. No acute bony abnormality. IMPRESSION: Patchy bilateral airspace disease could reflect edema or multifocal pneumonia. Mild cardiomegaly. Electronically Signed   By: Charlett Nose M.D.   On: 07/02/2023 14:28   Assessment: 73 year old female with history of GERD, anxiety, arthritis, depression, HLD, few month history of left-sided abdominal pain, lack of appetite, weight loss, recent colitis involving the cecum and ascending colon on CT 05/26/2023 in the ED treated with Augmentin twice daily x 7 days, subsequent colonoscopy 3/28 for diagnostic purposes showing active colitis involving cecum, ascending, transverse, descending colon, but no chronic features, treated with Cipro and Flagyl, who presented to the ER on 4/7 due to worsening fatigue, weakness, nausea/vomiting.  found to have acute anemia with hemoglobin 6, elevated LFTs, AKI, bilateral pneumonia, elevated troponin/BNP.  GI consulted for anemia.  Acute anemia: Hemoglobin down to 6 from 12 one month ago.,  No iron, B12 or folate deficiency.  No overt GI bleeding.  Recent colonoscopy in March as per above.  No previous EGD.  Chronically on diclofenac twice daily for last 4 to 5 years.  Will need EGD prior to discharge which respiratory status has improved, unfortunately her WOB and oxygen requirements increased overnight, currently on 8L humidified O2.  She had received 2 units packed blood cells thus far, hemoglobin stable at 8.9 this morning   Abdominal pain/weight loss/nausea/early satiety/history of colitis; left-sided abdominal pain, loss of  appetite, nausea, early satiety and weight loss starting back in March 2025.  Recently treated for colitis in March.  Chronic nausea/vomiting worsened with start of antibiotics which have since been discontinued.  Her abdominal pain has essentially resolved.  Denies diarrhea or rectal bleeding.  Etiology of abdominal pain and history of colitis is unclear.  Does take diclofenac twice daily chronically.  CT this admission with out any evidence of bowel wall thickening/recurrent colitis and she is notably on broad-spectrum antibiotics for pneumonia which would cover for presence of colitis..  Recommend avoiding NSAIDs, proceeding with EGD once stable from respiratory status, as above.  Elevated LFTs: Acute mild elevation of AST, ALT, alk phos, T. Bili and INR.  She reports very rare alcohol use.  No evidence of acute liver failure.  Denies illicit drug use, herbal supplements, family history of liver disease, personal or family history of autoimmune conditions.  Taking 2 to 4 tablets of Tylenol daily.  Etiology of elevation is unclear.  May be secondary to acute illness/recent antibiotics/drug-induced liver injury.  Ferritin elevated this admission, likely acute phase reactant.  She also has elevated troponins and elevated BNP, ischemic changes versus congestive hepatopathy also considered, echo reassuring this admission.  Acute hepatitis panel is negative.  Overall,, LFTs have improved today with AST 44, ALT 47, T. bili 1.1 however alk phos has increased from 182 to 268 and INR from 1.7 to 1.9.   Plan: Clear liquid diet Trend LFTs  PPI twice daily Avoid all NSAIDs Monitor for overt GI bleeding, trend H&H Continue to trend LFTs and INR daily Monitor for worsening abdominal pain EGD once medically stable    LOS: 2 days    07/04/2023, 9:51 AM   Ebonie Westerlund L. Jeanmarie Hubert, MSN, APRN, AGNP-C Adult-Gerontology Nurse Practitioner [facility]

## 2023-07-04 NOTE — Progress Notes (Signed)
 PROGRESS NOTE     Sabrina Stein, is a 73 y.o. female, DOB - 21-Nov-1950, ZOX:[id number]  Admit date - 07/02/2023   Admitting Physician Courage Mariea Clonts, MD  Outpatient Primary MD for the patient is Sabrina Spencer, FNP  LOS - 2  Chief Complaint  Patient presents with   Fatigue        Brief Narrative:   73 y.o. female with medical history significant for recent diagnosis of colitis, depressive disorder with anxiety, HLD and GERD admitted on 07/02/2023 with acute hypoxic respiratory failure due to multifocal pneumonia in the setting of recurrent emesis with presumed aspiration, as well as acute symptomatic anemia    -Assessment and Plan: 1) acute hypoxic respiratory failure--- CT chest with multifocal pneumonia ---suspect aspiration pneumonia related especially given recurrent emesis prior to admission  currently requiring up to 5 L of oxygen via nasal cannula WBC count 15.8>> 13.5>>11.4 -Procalcitonin elevated at 3.44, lactic acid 2.0. -Patient is afebrile. -c/n  IV Unasyn to cover for both presumed aspiration pneumonia and colitis -c/n  doxycycline for atypical coverage (avoid azithro--to avoid GI upset). -Continue the use of flutter valve/incentive spirometer and the use of mucolytic's.   2) colitis--Patient underwent colonoscopy with Dr. Marletta Lor on 06/22/2023 with colonoscopy findings of Patchy mild inflammation was found in the sigmoid colon, in the descending colon and in the transverse colon secondary to colitis. Biopsied. - Diverticulosis in the sigmoid colon and in the descending colon. =-Pathology was consistent with active colitis -- Antibiotics changed as above #1 -Send stool for C. difficile and stool pathogen/GI culture -GI consult appreciated -CT abdomen and pelvis without contrast without significant bowel wall thickening or other significant intra-abdominal abnormalities  3)Severe Acute Anemia--- --Hgb was 12.1 on 05/26/2023 -Hgb 6.0 >>9.0 after transfusion of 2 units  of packed red blood cells on 07/02/2023 suspect acute GI blood loss related in the setting of ongoing colitis -Patient is Hemoccult negative -Emesis without blood -Protonix as ordered --Stool occult blood negative -Ferritin and folate are not low -B12 is low normal -Serum iron is not low, TIBC is low, iron saturation is normal -Stopped diclofenac --GI input appreciated Patient will need EGD this admission prior to discharge once respiratory status improves especially given diclofenac use  4)Transaminitis--- Newly elevated transaminases  Previously normal transaminases. Alk phos previously 190. -Acute Viral  hepatitis profile negative -Avoid hepatotoxic agents -Maintain adequate hydration -Continue to follow LFTs trend intermittently.  5)Elevated Troponin/Elevated BNP--- suspect demand ischemia in the setting of severe anemia and hypoxia - BNP 1141. Troponin 230, -EKG sinus rhythm without acute changes --Discussed with Dr. Wyline Mood from cardiology service -Echo from 07/02/2023 with EF of 55 to 70%, grade 1 diastolic dysfunction noted, no regional wall motion normalities, mildly elevated pulmonary artery systolic pressure noted, no mitral stenosis, mild aortic stenosis noted --Patient remains chest pain-free  6)D-dimer elevated at 5.10- --LE  venous Dopplers without DVT -Patient denies chest pain.   7)Depression/anxiety -Stable mood -Continue the use of Effexor and as needed Xanax  8) hypertension -Overall stable -Will continue as needed hydralazine. -Follow vital signs.  Status is: Inpatient   Disposition: The patient is from: Home              Anticipated d/c is to: Home              Anticipated d/c date is: > 3 days              Patient currently is not medically stable to d/c.  Barriers:  Not Clinically Stable-   Code Status :   Code Status: Full Code   Family Communication:   Discussed with husband and daughter at bedside. DVT Prophylaxis  :   - SCDs   SCDs Start:  07/02/23 1435 Place TED hose Start: 07/02/23 1435   Lab Results  Component Value Date   PLT 389 07/04/2023    Inpatient Medications  Scheduled Meds:  atorvastatin  20 mg Oral Daily   Chlorhexidine Gluconate Cloth  6 each Topical Q0600   dextromethorphan-guaiFENesin  1 tablet Oral BID   doxycycline  100 mg Oral Q12H   fesoterodine  4 mg Oral Daily   ipratropium-albuterol  3 mL Nebulization BID   mirabegron ER  50 mg Oral Daily   pantoprazole (PROTONIX) IV  40 mg Intravenous Q12H   sodium bicarbonate  650 mg Oral BID   sodium chloride flush  3 mL Intravenous Q12H   sodium chloride flush  3 mL Intravenous Q12H   venlafaxine XR  75 mg Oral Daily   Continuous Infusions:  ampicillin-sulbactam (UNASYN) IV Stopped (07/04/23 0836)   PRN Meds:.acetaminophen **OR** acetaminophen, albuterol, ALPRAZolam, hydrALAZINE, ondansetron **OR** ondansetron (ZOFRAN) IV, polyvinyl alcohol, sodium chloride flush, traZODone   Anti-infectives (From admission, onward)    Start     Dose/Rate Route Frequency Ordered Stop   07/02/23 2200  doxycycline (VIBRA-TABS) tablet 100 mg        100 mg Oral Every 12 hours 07/02/23 1930     07/02/23 2030  Ampicillin-Sulbactam (UNASYN) 3 g in sodium chloride 0.9 % 100 mL IVPB        3 g 200 mL/hr over 30 Minutes Intravenous Every 12 hours 07/02/23 1942     07/02/23 2000  ciprofloxacin (CIPRO) tablet 500 mg  Status:  Discontinued        500 mg Oral 2 times daily 07/02/23 1440 07/02/23 1929   07/02/23 1800  metroNIDAZOLE (FLAGYL) tablet 500 mg  Status:  Discontinued        500 mg Oral 2 times daily 07/02/23 1440 07/02/23 1929   07/02/23 1315  piperacillin-tazobactam (ZOSYN) IVPB 3.375 g        3.375 g 100 mL/hr over 30 Minutes Intravenous  Once 07/02/23 1301 07/02/23 1451         Subjective: Jiles Garter afebrile, no chest pain, no nausea, no vomiting, no upper bleeding appreciated.  After transfusion hemoglobin stable.  Patient is still requiring 7-9 L high  flow nasal cannula.  Objective: Vitals:   07/04/23 1400 07/04/23 1500 07/04/23 1600 07/04/23 1700  BP: (!) 144/56 (!) 168/73 (!) 124/57 (!) 161/73  Pulse: (!) 101 (!) 101 (!) 103 (!) 101  Resp: (!) 24 (!) 22 (!) 32 (!) 24  Temp:   98.3 F (36.8 C)   TempSrc:   Oral   SpO2: 91% 91% 91% (!) 87%  Weight:      Height:        Intake/Output Summary (Last 24 hours) at 07/04/2023 1734 Last data filed at 07/04/2023 1600 Gross per 24 hour  Intake 1470.63 ml  Output 1600 ml  Net -129.37 ml   Filed Weights   07/02/23 1236 07/03/23 0404 07/04/23 0529  Weight: 83 kg 83.3 kg 79.6 kg    Physical Exam General exam: Alert, awake, oriented x 3; reports feeling better and breathing easier.  Requiring 7-9 L high flow nasal cannula. Respiratory system: Positive scattered rhonchi; no using accessory muscle.  No wheezing on exam.  Tachypnea appreciated with exertion. Cardiovascular  system:RRR. No rubs or gallops; no JVD. Gastrointestinal system: Abdomen is nondistended, soft and nontender. No organomegaly or masses felt. Normal bowel sounds heard. Central nervous system:No focal neurological deficits. Extremities: No cyanosis, clubbing or edema. Skin: No petechiae. Psychiatry: Judgement and insight appear normal. Mood & affect appropriate.   Data Reviewed: I have personally reviewed following labs and imaging studies  CBC: Recent Labs  Lab 07/02/23 1333 07/02/23 1554 07/03/23 0508 07/04/23 0500  WBC 15.8*  --  13.5* 11.4*  NEUTROABS 13.4*  --   --   --   HGB 6.0* 6.3* 9.0* 8.9*  HCT 18.8* 19.6* 26.6* 27.1*  MCV 94.0  --  88.1 89.7  PLT 528*  --  460* 389   Basic Metabolic Panel: Recent Labs  Lab 07/02/23 1333 07/03/23 0508 07/04/23 0500  NA 134* 134* 136  K 4.0 3.5 3.6  CL 99 99 101  CO2 17* 19* 22  GLUCOSE 152* 122* 132*  BUN 45* 53* 43*  CREATININE 3.09* 3.30* 1.97*  CALCIUM 8.6* 8.3* 8.4*   GFR: Estimated Creatinine Clearance: 25.2 mL/min (A) (by C-G formula based on SCr  of 1.97 mg/dL (H)).  Liver Function Tests: Recent Labs  Lab 07/02/23 1333 07/03/23 0508 07/04/23 0500  AST 149* 104* 44*  ALT 72* 64* 47*  ALKPHOS 169* 182* 268*  BILITOT 0.9 1.5* 1.1  PROT 7.0 7.0 6.8  ALBUMIN 2.2* 2.4* 2.1*   HbA1C: Recent Labs    07/02/23 1436  HGBA1C 5.8*   Recent Results (from the past 240 hours)  MRSA Next Gen by PCR, Nasal     Status: None   Collection Time: 07/02/23  3:30 PM   Specimen: Nasal Mucosa; Nasal Swab  Result Value Ref Range Status   MRSA by PCR Next Gen NOT DETECTED NOT DETECTED Final    Comment: (NOTE) The GeneXpert MRSA Assay (FDA approved for NASAL specimens only), is one component of a comprehensive MRSA colonization surveillance program. It is not intended to diagnose MRSA infection nor to guide or monitor treatment for MRSA infections. Test performance is not FDA approved in patients less than 33 years old. Performed at [facility], [address], [place], [place] [address]     Radiology Studies: CT CHEST ABDOMEN PELVIS WO CONTRAST Result Date: 07/02/2023 CLINICAL DATA:  Abdomen pain nausea vomiting weakness EXAM: CT CHEST, ABDOMEN AND PELVIS WITHOUT CONTRAST TECHNIQUE: Multidetector CT imaging of the chest, abdomen and pelvis was performed following the standard protocol without IV contrast. RADIATION DOSE REDUCTION: This exam was performed according to the departmental dose-optimization program which includes automated exposure control, adjustment of the mA and/or kV according to patient size and/or use of iterative reconstruction technique. COMPARISON:  CT 05/26/2023, chest x-ray 07/02/2023 FINDINGS: CT CHEST FINDINGS Cardiovascular: Limited without intravenous contrast. Mild aortic atherosclerosis. No aneurysm. Normal cardiac size. Coronary vascular calcification. Mitral calcification. No pericardial effusion Mediastinum/Nodes: Patent trachea. No thyroid mass. No suspicious lymph nodes. Esophagus within normal limits Lungs/Pleura:  Widespread bilateral heterogeneous consolidations and ground-glass disease. No pleural effusion Musculoskeletal: No acute osseous abnormality CT ABDOMEN PELVIS FINDINGS Hepatobiliary: Distended gallbladder. No calcified stones or biliary dilatation Pancreas: Unremarkable. No pancreatic ductal dilatation or surrounding inflammatory changes. Spleen: Normal in size without focal abnormality. Adrenals/Urinary Tract: Adrenal glands are normal. Kidneys show no hydronephrosis. Mild atrophy of the left kidney compared to the right. The bladder is normal Stomach/Bowel: Stomach is within normal limits. No evidence of bowel wall thickening, distention, or inflammatory changes. Vascular/Lymphatic: Aortic atherosclerosis. No enlarged abdominal  or pelvic lymph nodes. Reproductive: Uterus and bilateral adnexa are unremarkable. Other: Negative for pelvic effusion or free air Musculoskeletal: No acute or suspicious osseous abnormality. IMPRESSION: 1. Widespread bilateral heterogeneous consolidations and ground-glass disease consistent with multifocal pneumonia. 2. No CT evidence for acute intra-abdominal or pelvic abnormality. 3. Distended gallbladder 4. Aortic atherosclerosis. Electronically Signed   By: Jasmine Pang M.D.   On: 07/02/2023 23:46   Scheduled Meds:  atorvastatin  20 mg Oral Daily   Chlorhexidine Gluconate Cloth  6 each Topical Q0600   dextromethorphan-guaiFENesin  1 tablet Oral BID   doxycycline  100 mg Oral Q12H   fesoterodine  4 mg Oral Daily   ipratropium-albuterol  3 mL Nebulization BID   mirabegron ER  50 mg Oral Daily   pantoprazole (PROTONIX) IV  40 mg Intravenous Q12H   sodium bicarbonate  650 mg Oral BID   sodium chloride flush  3 mL Intravenous Q12H   sodium chloride flush  3 mL Intravenous Q12H   venlafaxine XR  75 mg Oral Daily   Continuous Infusions:  ampicillin-sulbactam (UNASYN) IV Stopped (07/04/23 0836)    LOS: 2 days   Vassie Loll M.D on 07/04/2023 at 5:34 PM  Go to  [email or web address] - for contact info  [facility] - Office  [phone]  If 7PM-7AM, please contact night-coverage [email or web address] 07/04/2023, 5:34 PM

## 2023-07-04 NOTE — Plan of Care (Signed)

## 2023-07-04 NOTE — Progress Notes (Signed)
 0800 patient alert x4 able to make all needs known on 7L [place] increased to 9L then 11L to get SP02 over 90%  0900 RT decreased 02 to 9L [place] pt tolerating above 90% at this time. Patients husband at bedside updated on plan of care at this time waiting on MD to see if any procedure will be done at this time HBG stable.

## 2023-07-04 NOTE — Progress Notes (Signed)
 RT placed patient on CPAP 6 with 8lpm bled in to assist with breathing. Previous she was on 7LPM HFNC and saturation continued to drop with at rest. RT/RN explained need for device and patient was agreeable to try to help with desaturation and rest. RT will continue to monitor for changes will on CPAP.

## 2023-07-04 NOTE — Progress Notes (Signed)
   07/04/23 2157  BiPAP/CPAP/SIPAP  BiPAP/CPAP/SIPAP Pt Type Adult  BiPAP/CPAP/SIPAP DREAMSTATIOND  Mask Type Full face mask  Dentures removed? Not applicable  Mask Size Medium  Respiratory Rate 22 breaths/min  EPAP 6 cmH2O  Flow Rate 9 lpm  Patient Home Machine No  Patient Home Mask No  Patient Home Tubing No  Auto Titrate No  Device Plugged into RED Power Outlet Yes  BiPAP/CPAP /SiPAP Vitals  Pulse Rate 100  Resp (!) 23  BP (!) 154/72  SpO2 92 %  Bilateral Breath Sounds Clear;Diminished  MEWS Score/Color  MEWS Score 1  MEWS Score Color Green

## 2023-07-04 NOTE — Plan of Care (Signed)

## 2023-07-05 ENCOUNTER — Inpatient Hospital Stay (HOSPITAL_COMMUNITY)

## 2023-07-05 DIAGNOSIS — D649 Anemia, unspecified: Secondary | ICD-10-CM | POA: Diagnosis not present

## 2023-07-05 DIAGNOSIS — J9601 Acute respiratory failure with hypoxia: Secondary | ICD-10-CM | POA: Diagnosis not present

## 2023-07-05 DIAGNOSIS — J690 Pneumonitis due to inhalation of food and vomit: Secondary | ICD-10-CM

## 2023-07-05 DIAGNOSIS — K219 Gastro-esophageal reflux disease without esophagitis: Secondary | ICD-10-CM | POA: Diagnosis not present

## 2023-07-05 DIAGNOSIS — K529 Noninfective gastroenteritis and colitis, unspecified: Secondary | ICD-10-CM | POA: Diagnosis not present

## 2023-07-05 DIAGNOSIS — R0602 Shortness of breath: Secondary | ICD-10-CM | POA: Diagnosis not present

## 2023-07-05 DIAGNOSIS — D500 Iron deficiency anemia secondary to blood loss (chronic): Secondary | ICD-10-CM | POA: Diagnosis not present

## 2023-07-05 DIAGNOSIS — N179 Acute kidney failure, unspecified: Secondary | ICD-10-CM | POA: Diagnosis not present

## 2023-07-05 DIAGNOSIS — E669 Obesity, unspecified: Secondary | ICD-10-CM | POA: Diagnosis not present

## 2023-07-05 DIAGNOSIS — R918 Other nonspecific abnormal finding of lung field: Secondary | ICD-10-CM | POA: Diagnosis not present

## 2023-07-05 LAB — GLUCOSE, CAPILLARY
Glucose-Capillary: 97 mg/dL (ref 70–99)
Glucose-Capillary: 171 mg/dL — ABNORMAL HIGH (ref 70–99)
Glucose-Capillary: 109 mg/dL — ABNORMAL HIGH (ref 70–99)

## 2023-07-05 LAB — BLOOD GAS, ARTERIAL
Acid-Base Excess: 1.1 mmol/L (ref 0.0–2.0)
Bicarbonate: 23.4 mmol/L (ref 20.0–28.0)
Drawn by: 44135
O2 Saturation: 90.4 %
Patient temperature: 37
pCO2 arterial: 30 mmHg — ABNORMAL LOW (ref 32–48)
pH, Arterial: 7.5 — ABNORMAL HIGH (ref 7.35–7.45)
pO2, Arterial: 54 mmHg — ABNORMAL LOW (ref 83–108)

## 2023-07-05 LAB — GAMMA GT: GGT: 200 U/L — ABNORMAL HIGH (ref 7–50)

## 2023-07-05 NOTE — Progress Notes (Signed)
 Patient has been having desating episodes throughout the night even with the highest flow of oxygen 15 litres on CPAP, patient desats to low 70s and goes back up after waking her up and some breathing exercise, no SOB, increased work of breathing noted, looks more like a sleep apnea, RT made aware, pressure changed on CPAP, did better with that, need evaluation of sleep apnea, will endorse to a daytime RN ,

## 2023-07-05 NOTE — Progress Notes (Addendum)
 Received call from RN that patient is still having desat episodes despite being on a high flow through CPAP machine.  Sats increase once patient is awakened.  Patient will probably need Bipap if O2 demand gets any higher due to fact that you can only run so much through the Dreamstation, but have more control of higher O2 demands with Bipap.  Tried raising pressure on CPAP to see if this helps.  Patient was on a pressure of 6; have raised to 8.  While patient was off CPAP during pressure change, she took several sips of water and immediately started coughing afterwards, so I placed patient back on CPAP, and will continue to monitor sats on new pressure setting.

## 2023-07-05 NOTE — Progress Notes (Signed)
 Brief Progress Note: Chart reviewed, patient not seen.   Currently admitted with acute anemia this admission with hgb down to 6. Has received 2u PRBC and thus far hemoglobin remains stable. Has chornically been on NSAIDS and also with recent colitis and treated with Augmentin in early March.   Colonoscopy 3/28 with active colitis (cecum, ascending, transverse, and descending) treated with cipro and flagyl.   Has elevated LFTS but down trending yesterday although alk phos rising.  GGT elevated. Etiology of elevated lfts reamins unclear hwoever given current and recent medical issues this could be antibiotic induced/recent illness/ DILI. Ferritin liekly reactive and viral hepatitis negative. Could be ischemic vs congestive hepatopathy as well.   Currently with tenuous respiratory status therefore will plan for EGD once stable from respiratory standpoint. Will follow LFTs peripherally.   Recommendations: EGD once cleared from respiratory standpoint Clear liquids as tolerated Trend LFTs, INR PPI BID Avoids NSAIDs Trend H/H, transfuse for Hgb <7 Monitor for GI bleeding Please notify if worsening abdominal pain.   Brooke Bonito, MSN, FNP-BC, AGACNP-BC [facility]

## 2023-07-05 NOTE — Progress Notes (Signed)
   07/05/23 2229  Vent Select  $ Ventilator Initial/Subsequent  Initial  Invasive or Noninvasive Noninvasive  Adult Vent Y  Vent start date 07/05/23  Vent start time 2229  Adult Ventilator Settings  Vent Type Servo-air  Vent Mode BIPAP  Set Rate 15 bmp  FiO2 (%) 60 %  I Time 0.9 Sec(s)  IPAP 10 cmH20  EPAP 5 cmH20  Pressure Control 5 cmH20  PEEP 5 cmH20  Adult Ventilator Measurements  Peak Airway Pressure 12 L/min  Mean Airway Pressure 7.5 cmH20  Resp Rate Total 25 br/min  Exhaled Vt 682 mL  Measured Ve 18.2 L  I:E Ratio Measured 1:1.7  Auto PEEP 0 cmH20  Total PEEP 4.9 cmH20  SpO2 97 %  Adult Ventilator Alarms  Alarms On Y  Ve High Alarm 25 L/min  Ve Low Alarm 4 L/min  Resp Rate High Alarm 38 br/min  Resp Rate Low Alarm 8  PEEP Low Alarm 3 cmH2O  Press High Alarm 25 cmH2O  T Apnea 20 sec(s)  VAP Prevention  HOB> 30 Degrees Y  Breath Sounds  Bilateral Breath Sounds Clear;Diminished  R Upper  Breath Sounds Clear  L Upper Breath Sounds Clear  R Lower Breath Sounds Clear;Diminished  L Lower Breath Sounds Clear;Diminished  Vent Respiratory Assessment  Respiratory Pattern Regular;Unlabored

## 2023-07-05 NOTE — Progress Notes (Signed)
 PROGRESS NOTE     Sabrina Stein, is a 73 y.o. female, DOB - 26-Aug-1950, ION:[id number]  Admit date - 07/02/2023   Admitting Physician Courage Mariea Clonts, MD  Outpatient Primary MD for the patient is Sabrina Spencer, FNP  LOS - 3  Chief Complaint  Patient presents with   Fatigue        Brief Narrative:   73 y.o. female with medical history significant for recent diagnosis of colitis, depressive disorder with anxiety, HLD and GERD admitted on 07/02/2023 with acute hypoxic respiratory failure due to multifocal pneumonia in the setting of recurrent emesis with presumed aspiration, as well as acute symptomatic anemia    -Assessment and Plan: 1) acute hypoxic respiratory failure--- CT chest with multifocal pneumonia ---suspect aspiration pneumonia related especially given recurrent emesis prior to admission  currently requiring up to 5 L of oxygen via nasal cannula WBC count 15.8>> 13.5>>11.4 -Procalcitonin elevated at 3.44, lactic acid 2.0. -Patient is afebrile. -c/n  IV Unasyn to cover for both presumed aspiration pneumonia and colitis -c/n  doxycycline for atypical coverage (avoid azithro--to avoid GI upset). -Continue the use of flutter valve/incentive spirometer and the use of mucolytic's. -Given concerns for hypoxemic component throughout the night will restart BiPAP nightly. -Outpatient follow-up with sleep study recommended.   2) colitis--Patient underwent colonoscopy with Dr. Marletta Lor on 06/22/2023 with colonoscopy findings of Patchy mild inflammation was found in the sigmoid colon, in the descending colon and in the transverse colon secondary to colitis. Biopsied. - Diverticulosis in the sigmoid colon and in the descending colon. =-Pathology was consistent with active colitis -- Antibiotics changed as above #1 -Send stool for C. difficile and stool pathogen/GI culture -GI consult appreciated -CT abdomen and pelvis without contrast without significant bowel wall thickening or other  significant intra-abdominal abnormalities  3)Severe Acute Anemia--- --Hgb was 12.1 on 05/26/2023 -Hgb 6.0 >>9.0 after transfusion of 2 units of packed red blood cells on 07/02/2023 suspect acute GI blood loss related in the setting of ongoing colitis -Patient is Hemoccult negative -Emesis without blood -Protonix as ordered --Stool occult blood negative -Ferritin and folate are not low -B12 is low normal -Serum iron is not low, TIBC is low, iron saturation is normal -Stopped diclofenac --GI input appreciated -Patient will need EGD this admission prior to discharge once respiratory status improves especially given hx of  diclofenac use  4)Transaminitis--- Newly elevated transaminases  Previously normal transaminases. Alk phos previously 190. -Acute Viral  hepatitis profile negative -Avoid hepatotoxic agents -Maintain adequate hydration -Continue to follow LFTs trend intermittently.  5)Elevated Troponin/Elevated BNP--- suspect demand ischemia in the setting of severe anemia and hypoxia - BNP 1141. Troponin 230, -EKG sinus rhythm without acute changes --Discussed with Dr. Wyline Mood from cardiology service; changes in presentation not suggesting ACS. -Echo from 07/02/2023 with EF of 55 to 70%, grade 1 diastolic dysfunction noted, no regional wall motion normalities, mildly elevated pulmonary artery systolic pressure noted, no mitral stenosis, mild aortic stenosis noted --Patient remains chest pain-free. -Repeat x-ray not demonstrating vascular congestion.  6)D-dimer elevated at 5.10- --LE  venous Dopplers without DVT -Patient denies chest pain.   7)Depression/anxiety -Stable mood -Continue the use of Effexor and as needed Xanax  8) hypertension -Overall stable -Will continue as needed hydralazine. -Continue to follow follow vital signs and adjust antihypertensive agents as needed.  9) urinary incontinence -Patient reporting experiencing some difficulty to void currently. -Will hold  tolterodine -Continue Myrbetriq -Follow response.  Status is: Inpatient   Disposition: The patient is from:  Home              Anticipated d/c is to: Home              Anticipated d/c date is: > 3 days              Patient currently is not medically stable to d/c.  Barriers: Not Clinically Stable-   Code Status :   Code Status: Full Code   Family Communication:   Discussed with husband and daughter at bedside. DVT Prophylaxis  :   - SCDs   SCDs Start: 07/02/23 1435 Place TED hose Start: 07/02/23 1435   Lab Results  Component Value Date   PLT 389 07/04/2023    Inpatient Medications  Scheduled Meds:  atorvastatin  20 mg Oral Daily   Chlorhexidine Gluconate Cloth  6 each Topical Q0600   dextromethorphan-guaiFENesin  1 tablet Oral BID   doxycycline  100 mg Oral Q12H   fesoterodine  4 mg Oral Daily   ipratropium-albuterol  3 mL Nebulization BID   mirabegron ER  50 mg Oral Daily   pantoprazole (PROTONIX) IV  40 mg Intravenous Q12H   sodium bicarbonate  650 mg Oral BID   sodium chloride flush  3 mL Intravenous Q12H   sodium chloride flush  3 mL Intravenous Q12H   venlafaxine XR  75 mg Oral Daily   Continuous Infusions:  ampicillin-sulbactam (UNASYN) IV 3 g (07/05/23 0838)   PRN Meds:.acetaminophen **OR** acetaminophen, albuterol, ALPRAZolam, hydrALAZINE, ondansetron **OR** ondansetron (ZOFRAN) IV, polyvinyl alcohol, sodium chloride flush, traZODone   Anti-infectives (From admission, onward)    Start     Dose/Rate Route Frequency Ordered Stop   07/02/23 2200  doxycycline (VIBRA-TABS) tablet 100 mg        100 mg Oral Every 12 hours 07/02/23 1930     07/02/23 2030  Ampicillin-Sulbactam (UNASYN) 3 g in sodium chloride 0.9 % 100 mL IVPB        3 g 200 mL/hr over 30 Minutes Intravenous Every 12 hours 07/02/23 1942     07/02/23 2000  ciprofloxacin (CIPRO) tablet 500 mg  Status:  Discontinued        500 mg Oral 2 times daily 07/02/23 1440 07/02/23 1929   07/02/23 1800   metroNIDAZOLE (FLAGYL) tablet 500 mg  Status:  Discontinued        500 mg Oral 2 times daily 07/02/23 1440 07/02/23 1929   07/02/23 1315  piperacillin-tazobactam (ZOSYN) IVPB 3.375 g        3.375 g 100 mL/hr over 30 Minutes Intravenous  Once 07/02/23 1301 07/02/23 1451        Subjective: Jiles Garter 7-8 L nasal cannula supplementation appreciated on today's examination; throughout the night with significant respiratory distress and desaturation process (given concern for underlying sleep apnea component).  No overt bleeding, no chest pain, no nausea, no  Objective: Vitals:   07/05/23 1400 07/05/23 1500 07/05/23 1600 07/05/23 1630  BP: 128/61     Pulse: (!) 104   99  Resp: 20 20 20  (!) 23  Temp:    98.1 F (36.7 C)  TempSrc:    Oral  SpO2: (!) 87%   90%  Weight:      Height:        Intake/Output Summary (Last 24 hours) at 07/05/2023 1804 Last data filed at 07/05/2023 1515 Gross per 24 hour  Intake --  Output 1395 ml  Net -1395 ml   Filed Weights   07/03/23 0404  07/04/23 0529 07/05/23 0400  Weight: 83.3 kg 79.6 kg 79.8 kg    Physical Exam General exam: Alert, awake, oriented x 3; experiencing significant desaturation process and tachypnea overnight.  Patient requiring the use of CPAP and around 7-8 L high flow nasal cannula supplementation. Respiratory system: Positive scattered rhonchi appreciated on exam; tachypneic with exertion.  No using accessory muscle. Cardiovascular system: No chest pain, no rubs, no gallops, no JVD on exam. Gastrointestinal system: Abdomen is nondistended, soft and nontender. No organomegaly or masses felt. Normal bowel sounds heard. Central nervous system:No focal neurological deficits. Extremities: No cyanosis, clubbing or edema. Skin: No petechiae. Psychiatry: Judgement and insight appear normal. Mood & affect appropriate.     Data Reviewed: I have personally reviewed following labs and imaging studies  CBC: Recent Labs  Lab  07/02/23 1333 07/02/23 1554 07/03/23 0508 07/04/23 0500  WBC 15.8*  --  13.5* 11.4*  NEUTROABS 13.4*  --   --   --   HGB 6.0* 6.3* 9.0* 8.9*  HCT 18.8* 19.6* 26.6* 27.1*  MCV 94.0  --  88.1 89.7  PLT 528*  --  460* 389   Basic Metabolic Panel: Recent Labs  Lab 07/02/23 1333 07/03/23 0508 07/04/23 0500  NA 134* 134* 136  K 4.0 3.5 3.6  CL 99 99 101  CO2 17* 19* 22  GLUCOSE 152* 122* 132*  BUN 45* 53* 43*  CREATININE 3.09* 3.30* 1.97*  CALCIUM 8.6* 8.3* 8.4*   GFR: Estimated Creatinine Clearance: 25.3 mL/min (A) (by C-G formula based on SCr of 1.97 mg/dL (H)).  Liver Function Tests: Recent Labs  Lab 07/02/23 1333 07/03/23 0508 07/04/23 0500  AST 149* 104* 44*  ALT 72* 64* 47*  ALKPHOS 169* 182* 268*  BILITOT 0.9 1.5* 1.1  PROT 7.0 7.0 6.8  ALBUMIN 2.2* 2.4* 2.1*   HbA1C: No results for input(s): "HGBA1C" in the last 72 hours.  Recent Results (from the past 240 hours)  MRSA Next Gen by PCR, Nasal     Status: None   Collection Time: 07/02/23  3:30 PM   Specimen: Nasal Mucosa; Nasal Swab  Result Value Ref Range Status   MRSA by PCR Next Gen NOT DETECTED NOT DETECTED Final    Comment: (NOTE) The GeneXpert MRSA Assay (FDA approved for NASAL specimens only), is one component of a comprehensive MRSA colonization surveillance program. It is not intended to diagnose MRSA infection nor to guide or monitor treatment for MRSA infections. Test performance is not FDA approved in patients less than 23 years old. Performed at [facility], [address], [place], [place] [address]     Radiology Studies: DG CHEST PORT 1 VIEW Result Date: 07/05/2023 CLINICAL DATA:  Shortness of breath. EXAM: PORTABLE CHEST 1 VIEW COMPARISON:  July 02, 2023. FINDINGS: Stable cardiomediastinal silhouette. Stable bilateral patchy opacities are noted consistent with multifocal pneumonia. Bony thorax is unremarkable. IMPRESSION: Stable bilateral lung opacities as noted above. Electronically  Signed   By: Lupita Raider M.D.   On: 07/05/2023 12:57   Scheduled Meds:  atorvastatin  20 mg Oral Daily   Chlorhexidine Gluconate Cloth  6 each Topical Q0600   dextromethorphan-guaiFENesin  1 tablet Oral BID   doxycycline  100 mg Oral Q12H   fesoterodine  4 mg Oral Daily   ipratropium-albuterol  3 mL Nebulization BID   mirabegron ER  50 mg Oral Daily   pantoprazole (PROTONIX) IV  40 mg Intravenous Q12H   sodium bicarbonate  650 mg Oral BID  sodium chloride flush  3 mL Intravenous Q12H   sodium chloride flush  3 mL Intravenous Q12H   venlafaxine XR  75 mg Oral Daily   Continuous Infusions:  ampicillin-sulbactam (UNASYN) IV 3 g (07/05/23 7564)    LOS: 3 days   Vassie Loll M.D on 07/05/2023 at 6:04 PM  Go to [email or web address] - for contact info  [facility] - Office  [phone]  If 7PM-7AM, please contact night-coverage [email or web address] 07/05/2023, 6:04 PM

## 2023-07-05 NOTE — Plan of Care (Signed)

## 2023-07-05 NOTE — Evaluation (Signed)
 Clinical/Bedside Swallow Evaluation Patient Details  Name: Sabrina Stein MRN: [id number] Date of Birth: 21-Sep-1950  Today's Date: 07/05/2023 Time: SLP Start Time (ACUTE ONLY): 1402 SLP Stop Time (ACUTE ONLY): 1426 SLP Time Calculation (min) (ACUTE ONLY): 24 min  Past Medical History:  Past Medical History:  Diagnosis Date   Anxiety    Arthritis    Depression    GERD (gastroesophageal reflux disease)    Hyperlipidemia    Past Surgical History:  Past Surgical History:  Procedure Laterality Date   APPENDECTOMY     CATARACT EXTRACTION Bilateral    CESAREAN SECTION     x2   COLONOSCOPY N/A 06/22/2023   Procedure: COLONOSCOPY;  Surgeon: Lanelle Bal, DO;  Location: [facility];  Service: Endoscopy;  Laterality: N/A;  130PM, OK RM 1   HPI:  73 y.o. female with medical history significant for recent diagnosis of colitis, depressive disorder with anxiety, HLD and GERD admitted on 07/02/2023 with acute hypoxic respiratory failure due to multifocal pneumonia in the setting of recurrent emesis with presumed aspiration, as well as acute symptomatic anemia. acute hypoxic respiratory failure--- CT chest with multifocal pneumonia  ---suspect aspiration pneumonia related especially given recurrent emesis prior to admission. GI following for possible EGD due to anemia, but unable to complete until respiratory status improves. BSE requested.    Assessment / Plan / Recommendation  Clinical Impression  Clinical swallow evaluation completed at bedside with Pt's daughter present. Pt denies any previous difficulty with oropharyngeal swallow, but does endorse "reflux" and states that after she eats a couple of bites, she "throws up". Pt and daughter indicate that she has "not been well" since February. Oral motor exam is WNL with the exception of mild xerostomia and upper/lower dentures. Pt assessed with ice chips, thin water via cup/straw and a small amount of puree and graham cracker as approved by RN  since Pt is on clears. Pt shows no overt signs or symptoms of aspiration and no reports of globus. Pt admitted after emesis episodes and reports h/o emesis from "reflux" prior to admission. SLP reviewed reflux and respiratory/swallow precautions to eat and drink slowly, swallow 2x for each bite/sip, sit upright for all PO and remain upright for 30+ minutes after meals. No further SLP services indicated at this time. SLP will sign off. Pt, family, and RN are in agreement with plan of care. GI is following and has EGD planned due to anemia (will send them a message regarding Pt reporting globus and reflux).  SLP Visit Diagnosis: Dysphagia, unspecified (R13.10)    Aspiration Risk  Mild aspiration risk (? post prandial aspiration)    Diet Recommendation Regular;Thin liquid    Liquid Administration via: Cup;Straw Medication Administration: Whole meds with liquid Supervision: Patient able to self feed Compensations: Small sips/bites;Slow rate;Multiple dry swallows after each bite/sip (due to current respiratory status) Postural Changes: Seated upright at 90 degrees;Remain upright for at least 30 minutes after po intake    Other  Recommendations Recommended Consults: Consider esophageal assessment (Pt likely to have EGD for anemia) Oral Care Recommendations: Oral care BID    Recommendations for follow up therapy are one component of a multi-disciplinary discharge planning process, led by the attending physician.  Recommendations may be updated based on patient status, additional functional criteria and insurance authorization.  Follow up Recommendations No SLP follow up      Assistance Recommended at Discharge    Functional Status Assessment Patient has had a recent decline in their functional status and  demonstrates the ability to make significant improvements in function in a reasonable and predictable amount of time.  Frequency and Duration            Prognosis Prognosis for improved  oropharyngeal function: Good      Swallow Study   General Date of Onset: 07/02/23 HPI: 73 y.o. female with medical history significant for recent diagnosis of colitis, depressive disorder with anxiety, HLD and GERD admitted on 07/02/2023 with acute hypoxic respiratory failure due to multifocal pneumonia in the setting of recurrent emesis with presumed aspiration, as well as acute symptomatic anemia. acute hypoxic respiratory failure--- CT chest with multifocal pneumonia  ---suspect aspiration pneumonia related especially given recurrent emesis prior to admission. GI following for possible EGD due to anemia, but unable to complete until respiratory status improves. BSE requested. Type of Study: Bedside Swallow Evaluation Previous Swallow Assessment: N/A Diet Prior to this Study: Clear liquid diet Temperature Spikes Noted: No Respiratory Status: Nasal cannula History of Recent Intubation: No Behavior/Cognition: Alert;Cooperative;Pleasant mood Oral Cavity Assessment: Within Functional Limits;Dry Oral Care Completed by SLP: Yes Oral Cavity - Dentition: Dentures, top;Dentures, bottom Vision: Functional for self-feeding Self-Feeding Abilities: Able to feed self Patient Positioning: Upright in bed Baseline Vocal Quality: Normal Volitional Cough: Strong Volitional Swallow: Able to elicit    Oral/Motor/Sensory Function Overall Oral Motor/Sensory Function: Within functional limits   Ice Chips Ice chips: Within functional limits Presentation: Spoon   Thin Liquid Thin Liquid: Within functional limits Presentation: Cup;Self Fed;Straw    Nectar Thick Nectar Thick Liquid: Not tested   Honey Thick Honey Thick Liquid: Not tested   Puree Puree: Within functional limits Presentation: Spoon;Self Fed   Solid     Solid: Within functional limits Presentation: Self Fed     Thank you,  Havery Moros, CCC-SLP [phone]  Halo Shevlin 07/05/2023,2:37 PM

## 2023-07-05 NOTE — Progress Notes (Signed)
 RT obtained ABG at 0940 and delivered sample to Lab at 0945.

## 2023-07-06 ENCOUNTER — Inpatient Hospital Stay (HOSPITAL_COMMUNITY)

## 2023-07-06 DIAGNOSIS — E785 Hyperlipidemia, unspecified: Secondary | ICD-10-CM

## 2023-07-06 DIAGNOSIS — I959 Hypotension, unspecified: Secondary | ICD-10-CM | POA: Diagnosis not present

## 2023-07-06 DIAGNOSIS — J189 Pneumonia, unspecified organism: Secondary | ICD-10-CM | POA: Diagnosis not present

## 2023-07-06 DIAGNOSIS — I10 Essential (primary) hypertension: Secondary | ICD-10-CM

## 2023-07-06 DIAGNOSIS — N179 Acute kidney failure, unspecified: Secondary | ICD-10-CM | POA: Diagnosis not present

## 2023-07-06 DIAGNOSIS — D649 Anemia, unspecified: Secondary | ICD-10-CM | POA: Diagnosis not present

## 2023-07-06 DIAGNOSIS — R7401 Elevation of levels of liver transaminase levels: Secondary | ICD-10-CM

## 2023-07-06 DIAGNOSIS — J9601 Acute respiratory failure with hypoxia: Secondary | ICD-10-CM | POA: Diagnosis not present

## 2023-07-06 LAB — BASIC METABOLIC PANEL WITH GFR
Anion gap: 11 (ref 5–15)
BUN: 30 mg/dL — ABNORMAL HIGH (ref 8–23)
CO2: 22 mmol/L (ref 22–32)
Calcium: 8.4 mg/dL — ABNORMAL LOW (ref 8.9–10.3)
Chloride: 103 mmol/L (ref 98–111)
Creatinine, Ser: 1.23 mg/dL — ABNORMAL HIGH (ref 0.44–1.00)
GFR, Estimated: 47 mL/min — ABNORMAL LOW (ref >60)
Glucose, Bld: 118 mg/dL — ABNORMAL HIGH (ref 70–99)
Potassium: 3.5 mmol/L (ref 3.5–5.1)
Sodium: 136 mmol/L (ref 135–145)

## 2023-07-06 LAB — BLOOD GAS, ARTERIAL
Acid-Base Excess: 3.6 mmol/L — ABNORMAL HIGH (ref 0.0–2.0)
Acid-Base Excess: 2.2 mmol/L — ABNORMAL HIGH (ref 0.0–2.0)
Bicarbonate: 26.5 mmol/L (ref 20.0–28.0)
Bicarbonate: 26.5 mmol/L (ref 20.0–28.0)
Drawn by: 22179
Drawn by: 35043
FIO2: 70 %
O2 Saturation: 95.1 %
O2 Saturation: 99.3 %
Patient temperature: 36.8
Patient temperature: 36.9
pCO2 arterial: 34 mmHg (ref 32–48)
pCO2 arterial: 39 mmHg (ref 32–48)
pH, Arterial: 7.5 — ABNORMAL HIGH (ref 7.35–7.45)
pH, Arterial: 7.44 (ref 7.35–7.45)
pO2, Arterial: 60 mmHg — ABNORMAL LOW (ref 83–108)
pO2, Arterial: 112 mmHg — ABNORMAL HIGH (ref 83–108)

## 2023-07-06 LAB — CBC
HCT: 26.8 % — ABNORMAL LOW (ref 36.0–46.0)
Hemoglobin: 8.2 g/dL — ABNORMAL LOW (ref 12.0–15.0)
MCH: 29.6 pg (ref 26.0–34.0)
MCHC: 30.6 g/dL (ref 30.0–36.0)
MCV: 96.8 fL (ref 80.0–100.0)
Platelets: 311 10*3/uL (ref 150–400)
RBC: 2.77 MIL/uL — ABNORMAL LOW (ref 3.87–5.11)
RDW: 15.9 % — ABNORMAL HIGH (ref 11.5–15.5)
WBC: 11.9 10*3/uL — ABNORMAL HIGH (ref 4.0–10.5)
nRBC: 0.5 % — ABNORMAL HIGH (ref 0.0–0.2)

## 2023-07-06 LAB — SARS CORONAVIRUS 2 BY RT PCR: SARS Coronavirus 2 by RT PCR: NEGATIVE

## 2023-07-06 LAB — GLUCOSE, CAPILLARY
Glucose-Capillary: 122 mg/dL — ABNORMAL HIGH (ref 70–99)
Glucose-Capillary: 167 mg/dL — ABNORMAL HIGH (ref 70–99)
Glucose-Capillary: 185 mg/dL — ABNORMAL HIGH (ref 70–99)

## 2023-07-06 MED ORDER — FUROSEMIDE 10 MG/ML IJ SOLN
40.0000 mg | Freq: Once | INTRAMUSCULAR | Status: AC
  Administered 2023-07-06: 40 mg via INTRAVENOUS
  Filled 2023-07-06: qty 4

## 2023-07-06 MED ORDER — MIDAZOLAM HCL 2 MG/2ML IJ SOLN
INTRAMUSCULAR | Status: AC
  Filled 2023-07-06: qty 2

## 2023-07-06 MED ORDER — FENTANYL CITRATE PF 50 MCG/ML IJ SOSY
25.0000 ug | PREFILLED_SYRINGE | INTRAMUSCULAR | Status: DC | PRN
  Administered 2023-07-06: 25 ug via INTRAVENOUS

## 2023-07-06 MED ORDER — FENTANYL 2500MCG IN NS 250ML (10MCG/ML) PREMIX INFUSION
INTRAVENOUS | Status: AC
  Administered 2023-07-06: 50 ug/h via INTRAVENOUS
  Filled 2023-07-06: qty 250

## 2023-07-06 MED ORDER — NOREPINEPHRINE 4 MG/250ML-% IV SOLN
INTRAVENOUS | Status: AC
Start: 2023-07-06 — End: 2023-07-06
  Administered 2023-07-06: 2 ug/min via INTRAVENOUS
  Filled 2023-07-06: qty 250

## 2023-07-06 MED ORDER — SODIUM CHLORIDE 0.9 % IV SOLN
3.0000 g | Freq: Four times a day (QID) | INTRAVENOUS | Status: DC
  Administered 2023-07-06: 3 g via INTRAVENOUS
  Filled 2023-07-06 (×7): qty 8

## 2023-07-06 MED ORDER — SODIUM CHLORIDE 0.9 % IV SOLN: 250.0000 mL | INTRAVENOUS | Status: AC

## 2023-07-06 MED ORDER — POLYETHYLENE GLYCOL 3350 17 G PO PACK: 17.0000 g | PACK | Freq: Every day | ORAL | Status: DC

## 2023-07-06 MED ORDER — MIDAZOLAM HCL 2 MG/2ML IJ SOLN: 1.0000 mg | INTRAMUSCULAR | Status: DC | PRN

## 2023-07-06 MED ORDER — PROPOFOL 1000 MG/100ML IV EMUL
0.0000 ug/kg/min | INTRAVENOUS | Status: DC
  Administered 2023-07-06: 50 ug/kg/min via INTRAVENOUS
  Administered 2023-07-07: 35 ug/kg/min via INTRAVENOUS
  Administered 2023-07-07: 50 ug/kg/min via INTRAVENOUS
  Administered 2023-07-07: 45 ug/kg/min via INTRAVENOUS
  Administered 2023-07-07 (×2): 50 ug/kg/min via INTRAVENOUS
  Administered 2023-07-07: 35 ug/kg/min via INTRAVENOUS
  Administered 2023-07-08 (×2): 40 ug/kg/min via INTRAVENOUS
  Administered 2023-07-08 (×2): 45 ug/kg/min via INTRAVENOUS
  Filled 2023-07-06 (×7): qty 100
  Filled 2023-07-06: qty 200

## 2023-07-06 MED ORDER — DOCUSATE SODIUM 50 MG/5ML PO LIQD
100.0000 mg | Freq: Two times a day (BID) | ORAL | Status: DC
Start: 2023-07-06 — End: 2023-07-06

## 2023-07-06 MED ORDER — POLYETHYLENE GLYCOL 3350 17 G PO PACK: 17.0000 g | PACK | Freq: Every day | ORAL | Status: DC | PRN

## 2023-07-06 MED ORDER — PROPOFOL 1000 MG/100ML IV EMUL
INTRAVENOUS | Status: AC
  Administered 2023-07-06: 5 ug/kg/min via INTRAVENOUS
  Filled 2023-07-06: qty 100

## 2023-07-06 MED ORDER — DOCUSATE SODIUM 50 MG/5ML PO LIQD: 100.0000 mg | Freq: Two times a day (BID) | ORAL | Status: DC

## 2023-07-06 MED ORDER — PIPERACILLIN-TAZOBACTAM 3.375 G IVPB
3.3750 g | Freq: Three times a day (TID) | INTRAVENOUS | Status: DC
  Administered 2023-07-06 – 2023-07-08 (×5): 3.375 g via INTRAVENOUS
  Filled 2023-07-06 (×4): qty 50

## 2023-07-06 MED ORDER — FENTANYL CITRATE PF 50 MCG/ML IJ SOSY: 25.0000 ug | PREFILLED_SYRINGE | INTRAMUSCULAR | Status: DC | PRN

## 2023-07-06 MED ORDER — MIDAZOLAM-SODIUM CHLORIDE 100-0.9 MG/100ML-% IV SOLN: 0.0000 mg/h | INTRAVENOUS | Status: DC

## 2023-07-06 MED ORDER — ROCURONIUM BROMIDE 10 MG/ML (PF) SYRINGE
PREFILLED_SYRINGE | INTRAVENOUS | Status: AC
  Filled 2023-07-06: qty 10

## 2023-07-06 MED ORDER — SODIUM CHLORIDE 0.9 % IV SOLN
100.0000 mg | Freq: Two times a day (BID) | INTRAVENOUS | Status: DC
  Administered 2023-07-06 – 2023-07-08 (×5): 100 mg via INTRAVENOUS
  Filled 2023-07-06 (×6): qty 100

## 2023-07-06 MED ORDER — FENTANYL CITRATE PF 50 MCG/ML IJ SOSY
PREFILLED_SYRINGE | INTRAMUSCULAR | Status: AC
Start: 2023-07-06 — End: 2023-07-07
  Filled 2023-07-06: qty 2

## 2023-07-06 MED ORDER — FENTANYL CITRATE PF 50 MCG/ML IJ SOSY
PREFILLED_SYRINGE | INTRAMUSCULAR | Status: AC
  Filled 2023-07-06: qty 1

## 2023-07-06 MED ORDER — ORAL CARE MOUTH RINSE: 15.0000 mL | OROMUCOSAL | Status: DC | PRN

## 2023-07-06 MED ORDER — POTASSIUM CHLORIDE CRYS ER 20 MEQ PO TBCR
40.0000 meq | EXTENDED_RELEASE_TABLET | Freq: Once | ORAL | Status: AC
  Administered 2023-07-06: 40 meq via ORAL
  Filled 2023-07-06: qty 2

## 2023-07-06 MED ORDER — METHYLPREDNISOLONE SODIUM SUCC 125 MG IJ SOLR
60.0000 mg | Freq: Two times a day (BID) | INTRAMUSCULAR | Status: DC
  Administered 2023-07-06 – 2023-07-07 (×3): 60 mg via INTRAVENOUS
  Filled 2023-07-06 (×3): qty 2

## 2023-07-06 MED ORDER — NOREPINEPHRINE 4 MG/250ML-% IV SOLN
2.0000 ug/min | INTRAVENOUS | Status: DC
  Administered 2023-07-07: 9 ug/min via INTRAVENOUS
  Administered 2023-07-07 (×2): 8 ug/min via INTRAVENOUS
  Administered 2023-07-07: 7 ug/min via INTRAVENOUS
  Administered 2023-07-08: 5 ug/min via INTRAVENOUS
  Filled 2023-07-06 (×4): qty 250

## 2023-07-06 MED ORDER — ORAL CARE MOUTH RINSE
15.0000 mL | OROMUCOSAL | Status: DC
  Administered 2023-07-07 – 2023-07-09 (×22): 15 mL via OROMUCOSAL

## 2023-07-06 MED ORDER — MIDAZOLAM HCL 2 MG/2ML IJ SOLN
1.0000 mg | INTRAMUSCULAR | Status: DC | PRN
  Administered 2023-07-07: 2 mg via INTRAVENOUS
  Filled 2023-07-06: qty 2

## 2023-07-06 MED ORDER — FENTANYL CITRATE (PF) 100 MCG/2ML IJ SOLN
INTRAMUSCULAR | Status: AC
  Filled 2023-07-06: qty 2

## 2023-07-06 MED ORDER — FENTANYL 2500MCG IN NS 250ML (10MCG/ML) PREMIX INFUSION
25.0000 ug/h | INTRAVENOUS | Status: DC
  Administered 2023-07-06: 100 ug/h via INTRAVENOUS
  Administered 2023-07-07 – 2023-07-08 (×3): 275 ug/h via INTRAVENOUS
  Administered 2023-07-08: 250 ug/h via INTRAVENOUS
  Filled 2023-07-06 (×5): qty 250

## 2023-07-06 MED ORDER — ETOMIDATE 2 MG/ML IV SOLN
INTRAVENOUS | Status: AC
Start: 2023-07-06 — End: 2023-07-06
  Administered 2023-07-06: 20 mg
  Filled 2023-07-06: qty 20

## 2023-07-06 MED ORDER — MIDAZOLAM HCL 2 MG/2ML IJ SOLN
INTRAMUSCULAR | Status: AC
  Administered 2023-07-06: 1 mg via INTRAVENOUS
  Filled 2023-07-06: qty 2

## 2023-07-06 MED ORDER — FENTANYL BOLUS VIA INFUSION
25.0000 ug | INTRAVENOUS | Status: DC | PRN
  Administered 2023-07-06 – 2023-07-07 (×2): 100 ug via INTRAVENOUS

## 2023-07-06 MED ORDER — SUCCINYLCHOLINE CHLORIDE 200 MG/10ML IV SOSY
PREFILLED_SYRINGE | INTRAVENOUS | Status: AC
  Administered 2023-07-06: 120 mg
  Filled 2023-07-06: qty 10

## 2023-07-06 MED ORDER — FENTANYL CITRATE PF 50 MCG/ML IJ SOSY: 25.0000 ug | PREFILLED_SYRINGE | Freq: Once | INTRAMUSCULAR | Status: DC

## 2023-07-06 MED ORDER — POLYETHYLENE GLYCOL 3350 17 G PO PACK
17.0000 g | PACK | Freq: Every day | ORAL | Status: DC
Start: 2023-07-06 — End: 2023-07-06

## 2023-07-06 MED ORDER — DOCUSATE SODIUM 50 MG/5ML PO LIQD: 100.0000 mg | Freq: Two times a day (BID) | ORAL | Status: DC | PRN

## 2023-07-06 MED ORDER — MIDAZOLAM BOLUS VIA INFUSION: 0.0000 mg | INTRAVENOUS | Status: DC | PRN

## 2023-07-06 MED ORDER — FAMOTIDINE 20 MG PO TABS: 20.0000 mg | ORAL_TABLET | Freq: Two times a day (BID) | ORAL | Status: DC

## 2023-07-06 MED ORDER — DOCUSATE SODIUM 100 MG PO CAPS: 100.0000 mg | ORAL_CAPSULE | Freq: Two times a day (BID) | ORAL | Status: DC | PRN

## 2023-07-06 NOTE — Progress Notes (Addendum)
 patient's jwellery ( bangles, rings) taken off by family and handed over to the daughter. RN Victorino Dike and RN Hailey at  bedside too.

## 2023-07-06 NOTE — Progress Notes (Signed)
 MD called to inform me that he increase her peep to 10.

## 2023-07-06 NOTE — Assessment & Plan Note (Addendum)
 Patient has features of ILD with worsening infiltrates.  Possible aspiration pneumonia. ILD flare, TRALI 04/10 Chest radiograph with persistent bilateral infiltrates.  02 saturation is 94% on 40 L/min per HFNC Fi02 70%   Plan to add systemic corticosteroids with methylprednisolone 60 mg IV bid One dose of furosemide  40 mg IV  Continue antibiotic therapy with Unasyn IV  Heated high flow nasal cannula to keep 02 saturation 88% or greater.  Airway clearing techniques with flutter valve and incentive spirometer.  Bipap at night.  Check echocardiogram.  If not improving, plan to transfer to [facility] ICU.

## 2023-07-06 NOTE — Progress Notes (Signed)
 Brief progress note.  Chart again reviewed and patient not seen.  Currently minute with acute anemia with initial hemoglobin of 6.  She has received multiple units of blood and hemoglobin has remained stable since then.  She has had downtrending transaminases this admission with elevated alk phos and elevated GGT.  Etiology of LFTs remains unclear however potential etiologies continue to include DILI/reactive inflammation given recent infection/ischemia/congestive hepatopathy.  Continues to not be optimized from respiratory standpoint.  Is on high flow nasal cannula 40 L and attempting to wean oxygen percentage, most recently at 70% with stable saturations.  Recommendations: EGD once cleared from respiratory standpoint, no increased work of breathing, no more than 2 L nasal cannula PPI twice daily Trend LFTs and INR Avoid NSAIDs Trend H/H, transfuse for hemoglobin less than 7 Continue to monitor for overt GI bleeding and worsening abdominal pain  Brooke Bonito, MSN, APRN, FNP-BC, AGACNP-BC [facility] at [employer]

## 2023-07-06 NOTE — Progress Notes (Signed)
 Patient was on 15L hiflo and respiratory called and now on heated hiflo due to O2 sat unable to stay >88. Patient denied any shortness of breath or chest discomfort. Patient had refused to use Bipap prior to heated hiflo being applied. Dr Ella Jubilee was made aware and in to assess patient. IV lasix, solumedrol and PO potassium given per orders. Patient remains alert and oriented x4. Urine output noted, (post lasix), to be dark tea colored and foamy. Dr Ella Jubilee made aware.

## 2023-07-06 NOTE — H&P (Signed)
 NAME:  Sabrina Stein, MRN:  [id number], DOB:  12-30-1950, LOS: 4 ADMISSION DATE:  07/02/2023, CONSULTATION DATE:  4/11 REFERRING MD:  Maud Deed , CHIEF COMPLAINT:  respiratory failure   History of Present Illness:  Sabrina Stein is a 73 y/o woman with a history of GERD, Depression/anxiety, former tobacco use who presented [facility] ED w/ hypoxic respiratory failure.  Patient underwent colonoscopy on 3/28 showing colitis. Started empirically on cipro/flagyl. Patient having persistent weakness, N/V, and abd pain, and now with sob.  Denies bloody stools or fever.  Came to [facility] on 4/7 for further eval. on arrival patient hypothermic 94.5 F with soft BP.  WBC 15.8.  Cultures obtained, given IV fluids, started on broad-spectrum antibiotics.  Patient also with hypoxia requiring nasal cannula.  CXR concerning for multifocal pneumonia.  Creat elevated 3.09 and elevated LFTs.  Hemoglobin down to 6 and Hemoccult negative.  Transfuse PRBC.  Started on Protonix.  GI consulted.  Also with elevated BNP and troponins.  Cardiology consulted suspect demand ischemia.  Echo showing EF 55 to 70%; grade 1 diastolic.  CT chest, abdomen, pelvis showing multifocal pneumonia.  Patient admitted to floors.  Throughout stay patient with increasing O2 requirements.  On 4/11 now on heated high flow 40L, 80%.  Started on IV steroids and given Lasix.  On Unasyn/doxy.  Patient looking more tired and decision made to proceed with intubation.  PCCM consulted for transfer, will admit to [facility].    Pertinent  Medical History  GERD HLD Depression  Significant Hospital Events: Including procedures, antibiotic start and stop dates in addition to other pertinent events   4/7 admitted to [facility] 4/11 worsening hypoxia requiring intubation; transferred to [facility]; PCCM consulted  Interim History / Subjective:  Intubated on 100% 5 peep sats 97% Sedate on prop/fent On 4 mcg levo  Objective   Blood pressure (!) 150/61, pulse 97, temperature 98.2 F  (36.8 C), temperature source Oral, resp. rate (!) 23, height 5\' 2"  (1.575 m), weight 82 kg, SpO2 (!) 88%.    Vent Mode: BIPAP FiO2 (%):  [60 %-100 %] 70 % Set Rate:  [15 bmp] 15 bmp PEEP:  [5 cmH20] 5 cmH20   Intake/Output Summary (Last 24 hours) at 07/06/2023 1851 Last data filed at 07/06/2023 1741 Gross per 24 hour  Intake 103 ml  Output 1500 ml  Net -1397 ml   Filed Weights   07/04/23 0529 07/05/23 0400 07/06/23 0420  Weight: 79.6 kg 79.8 kg 82 kg    Examination: General:  critically ill appearing on mech vent HEENT: MM pink/moist; ETT in place Neuro: sedate CV: s1s2, RRR, no m/r/g PULM:  dim BS bilaterally; on mech vent PRVC GI: soft, bsx4 active  Extremities: warm/dry, no edema  Skin: no rashes or lesions    Resolved Hospital Problem list     Assessment & Plan:   Acute respiratory failure with hypoxia Multifocal pneumonia: Possible aspiration Plan: -CXR on arrival -LTVV strategy with tidal volumes of 6-8 cc/kg ideal body weight -check ABG and adjust settings accordingly -Goal plateau pressures less than 30 and driving pressures less than 15 -Wean PEEP/FiO2 for SpO2 >92% -VAP bundle in place -Daily SAT and SBT -PAD protocol in place -wean sedation for RASS goal 0 to -1 - Change unaysn to Zosyn and cont doxy - Check tracheal aspirate, Legionella/strep, RVP, MRSA PCR, PCT - IV steroids - Given lasix today; monitor UOP  Acute anemia -s/p 2 units PRBC 4/7; fecal occult negative -hx of diclofenac use Plan: -cont  PPI BID -trend cbc -will likely need GI consult in am for possible EGD   Hypotension: likely sedation induced Plan: -wean sedation for RASS 0 to -1 -levo for map goal >65  AKI Plan: -Trend BMP / urinary output -Replace electrolytes as indicated -Avoid nephrotoxic agents, ensure adequate renal perfusion  Colitis -seen on colonoscopy on 3/28 -treated with cipro/flagyl outpt on 4/4 then on unasyn 4/7 on admission Plan: -zosyn as  above  HTN HLD Plan: -hold anti-htn meds -statin  Elevated LFTs Plan: -improving -cont to trend  Anxiety/depression Plan: -cont effexor -hold xanax   Best Practice (right click and "Reselect all SmartList Selections" daily)   Diet/type: NPO DVT prophylaxis SCD Pressure ulcer(s): N/A GI prophylaxis: PPI Lines: N/A Foley:  Yes, and it is still needed Code Status:  full code Last date of multidisciplinary goals of care discussion [4/11 update spouse Nadine Counts over phone]  Labs   CBC: Recent Labs  Lab 07/02/23 1333 07/02/23 1554 07/03/23 0508 07/04/23 0500 07/06/23 0504  WBC 15.8*  --  13.5* 11.4* 11.9*  NEUTROABS 13.4*  --   --   --   --   HGB 6.0* 6.3* 9.0* 8.9* 8.2*  HCT 18.8* 19.6* 26.6* 27.1* 26.8*  MCV 94.0  --  88.1 89.7 96.8  PLT 528*  --  460* 389 311    Basic Metabolic Panel: Recent Labs  Lab 07/02/23 1333 07/03/23 0508 07/04/23 0500 07/06/23 0504  NA 134* 134* 136 136  K 4.0 3.5 3.6 3.5  CL 99 99 101 103  CO2 17* 19* 22 22  GLUCOSE 152* 122* 132* 118*  BUN 45* 53* 43* 30*  CREATININE 3.09* 3.30* 1.97* 1.23*  CALCIUM 8.6* 8.3* 8.4* 8.4*   GFR: Estimated Creatinine Clearance: 41.1 mL/min (A) (by C-G formula based on SCr of 1.23 mg/dL (H)). Recent Labs  Lab 07/02/23 1333 07/02/23 1436 07/03/23 0508 07/04/23 0500 07/06/23 0504  PROCALCITON  --  3.44  --   --   --   WBC 15.8*  --  13.5* 11.4* 11.9*  LATICACIDVEN  --  2.0*  --   --   --     Liver Function Tests: Recent Labs  Lab 07/02/23 1333 07/03/23 0508 07/04/23 0500  AST 149* 104* 44*  ALT 72* 64* 47*  ALKPHOS 169* 182* 268*  BILITOT 0.9 1.5* 1.1  PROT 7.0 7.0 6.8  ALBUMIN 2.2* 2.4* 2.1*   No results for input(s): "LIPASE", "AMYLASE" in the last 168 hours. No results for input(s): "AMMONIA" in the last 168 hours.  ABG    Component Value Date/Time   PHART 7.5 (H) 07/06/2023 1800   PCO2ART 34 07/06/2023 1800   PO2ART 60 (L) 07/06/2023 1800   HCO3 26.5 07/06/2023 1800    O2SAT 95.1 07/06/2023 1800     Coagulation Profile: Recent Labs  Lab 07/03/23 0508 07/04/23 0500  INR 1.7* 1.9*    Cardiac Enzymes: No results for input(s): "CKTOTAL", "CKMB", "CKMBINDEX", "TROPONINI" in the last 168 hours.  HbA1C: HB A1C (BAYER DCA - WAIVED)  Date/Time Value Ref Range Status  07/31/2022 10:42 AM 5.7 (H) 4.8 - 5.6 % Final    Comment:             Prediabetes: 5.7 - 6.4          Diabetes: >6.4          Glycemic control for adults with diabetes: <7.0   01/30/2022 04:14 PM 5.8 (H) 4.8 - 5.6 % Final    Comment:  Prediabetes: 5.7 - 6.4          Diabetes: >6.4          Glycemic control for adults with diabetes: <7.0    Hgb A1c MFr Bld  Date/Time Value Ref Range Status  07/02/2023 02:36 PM 5.8 (H) 4.8 - 5.6 % Final    Comment:    (NOTE) Pre diabetes:          5.7%-6.4%  Diabetes:              >6.4%  Glycemic control for   <7.0% adults with diabetes     CBG: Recent Labs  Lab 07/04/23 2348 07/05/23 0623 07/05/23 1113 07/05/23 1838 07/06/23 1146  GLUCAP 121* 97 171* 109* 122*    Review of Systems:   Patient is intubated; therefore, history has been obtained from chart review.    Past Medical History:  She,  has a past medical history of Anxiety, Arthritis, Depression, GERD (gastroesophageal reflux disease), and Hyperlipidemia.   Surgical History:   Past Surgical History:  Procedure Laterality Date   APPENDECTOMY     CATARACT EXTRACTION Bilateral    CESAREAN SECTION     x2   COLONOSCOPY N/A 06/22/2023   Procedure: COLONOSCOPY;  Surgeon: Lanelle Bal, DO;  Location: [facility];  Service: Endoscopy;  Laterality: N/A;  130PM, OK RM 1     Social History:   reports that she quit smoking about 11 years ago. Her smoking use included cigarettes. She started smoking about 54 years ago. She has a 43 pack-year smoking history. She has never used smokeless tobacco. She reports current alcohol use. She reports current drug use.  Drug: Marijuana.   Family History:  Her family history includes Cancer in her father; Healthy in her daughter, sister, and son.   Allergies Allergies  Allergen Reactions   Codeine Nausea And Vomiting     Home Medications  Prior to Admission medications   Medication Sig Start Date End Date Taking? Authorizing Provider  acetaminophen (TYLENOL) 500 MG tablet Take 1,000 mg by mouth every 6 (six) hours as needed for mild pain (pain score 1-3).   Yes [provider]  ALPRAZolam (XANAX) 0.25 MG tablet Take 1 tablet (0.25 mg total) by mouth at bedtime as needed for anxiety. 01/09/23  Yes Hawks, Christy A, FNP  Ascorbic Acid (VITAMIN C) 1000 MG tablet Take 1,000 mg by mouth daily.   Yes [provider]  atorvastatin (LIPITOR) 20 MG tablet TAKE 1 TABLET BY MOUTH EVERY DAY 01/17/23  Yes Hawks, Christy A, FNP  Calcium Carbonate-Vit D-Min (CALCIUM 1200 PO) Take 1 tablet by mouth daily.   Yes [provider]  Cholecalciferol (VITAMIN D3) 1.25 MG (50000 UT) CAPS Take 1 capsule by mouth daily.   Yes [provider]  ciprofloxacin (CIPRO) 500 MG tablet Take 1 tablet (500 mg total) by mouth 2 (two) times daily for 10 days. 06/29/23 07/09/23 Yes Carver, Hennie Duos, DO  diclofenac (VOLTAREN) 75 MG EC tablet Take 1 tablet (75 mg total) by mouth 2 (two) times daily. 01/09/23  Yes Hawks, Christy A, FNP  dicyclomine (BENTYL) 20 MG tablet Take 1 tablet (20 mg total) by mouth 2 (two) times daily. 06/27/23 06/26/24 Yes Carver, Hennie Duos, DO  mirabegron ER (MYRBETRIQ) 50 MG TB24 tablet Take 1 tablet (50 mg total) by mouth daily. 07/31/22  Yes Hawks, Christy A, FNP  omeprazole (PRILOSEC) 40 MG capsule Take 1 capsule (40 mg total) by mouth daily. 05/15/23  Yes [place] Lighter, Dois Davenport, NP  ondansetron (ZOFRAN) 4 MG tablet Take 1 tablet (4 mg total) by mouth every 8 (eight) hours as needed for nausea. Patient taking differently: Take 4 mg by mouth every 8 (eight) hours as needed for nausea or  vomiting. 06/18/23  Yes Hawks, Christy A, FNP  tolterodine (DETROL LA) 4 MG 24 hr capsule Take 1 capsule (4 mg total) by mouth daily. 09/25/22  Yes Jerilee Field, MD  venlafaxine XR (EFFEXOR-XR) 75 MG 24 hr capsule Take 1 capsule (75 mg total) by mouth daily. 07/31/22  Yes Hawks, Christy A, FNP  zinc gluconate 50 MG tablet Take 50 mg by mouth daily.   Yes [provider]  polyethylene glycol-electrolytes (NULYTELY) 420 g solution Take 4,000 mLs by mouth once. 06/18/23   [provider]     Critical care time: 50 minutes       JD Daryel [facility] 07/06/2023, 8:11 PM  Please see [email or web address] for pager details.  From 7A-7P if no response, please call [phone]. After hours, please call [facility] [phone].

## 2023-07-06 NOTE — Assessment & Plan Note (Addendum)
 04/07 PRBC transfusion x2  Sp Hgb has been stable, no signs of active bleeding  Continue close follow up cell count.

## 2023-07-06 NOTE — Plan of Care (Signed)

## 2023-07-06 NOTE — Progress Notes (Signed)
 [facility]Link Physician-Brief Progress Note Patient Name: Sabrina Stein DOB: [birth year] MRN: [id number]   Date of Service  07/06/2023  HPI/Events of Note  Patient with multi-focal pneumonia, acute hypoxic respiratory failure requiring intubation and mechanical ventilation, colitis, suspected GI bleeding with acute blood loss anemia, and acute kidney injury transferred from [facility] hospital to [facility] for higher level of care.  eICU Interventions  New Patient Evaluation.        Migdalia Dk 07/06/2023, 11:53 PM

## 2023-07-06 NOTE — Progress Notes (Addendum)
 Worsening respiratory failure with increased work of breathing, she has failed non invasive mechanical ventilation.   Patient placed on invasive mechanical ventilation with improvement in her oxygenation.  Sedation with propofol. Radiograph post intubation, reviewed, will advance ET tube 1 cm in.  Plan to transfer to [facility] ICU.

## 2023-07-06 NOTE — Assessment & Plan Note (Signed)
 Continue with pantoprazole/.

## 2023-07-06 NOTE — Hospital Course (Addendum)
 Sabrina Stein was admitted to the hospital with the working diagnosis of aspiration pneumonia.   73 yo female with the past medical history of colitis, depression, GERD who presented with fatigue, vomiting, and generalized weakness. 3 days prior she was placed on ciprofloxacin and metronidazole for colitis. On her initial physical examination her blood pressure was 90/40, and she was hypoxic requiring supplemental 02 per [place] 5 L/min. Lungs with decreased breath sounds, and scattered rhonchi bilaterally, heart with S1 and S2 present and rhythmic with no gallops, abdomen with no distention,  positive tender to palpation, no lower extremity edema.   Patient was placed on IV antibiotic therapy for pneumonia.   04/11 patient with progressive and worsening respiratory failure, now up to heated high flow nasal cannula.

## 2023-07-06 NOTE — Progress Notes (Signed)
 Patient is responding to diuresis with increased urine output.  She continue to have dyspnea and high 02 requirements.   Plan to check chest radiograph and ABG.  With results will call ICU in [facility] with the intention to transfer to higher level of care.  Her daughter is at the bedside all questions were addressed.

## 2023-07-06 NOTE — Assessment & Plan Note (Signed)
 Renal function is improving with serum cr at 1.23 with K at 3,5 and serum bicarbonate at 22  Na 136   Plan for diuresis with furosemide 40 mg IV x1 Add oral Kcl to prevent hypokalemia.  Follow up renal function and electrolytes in am.  Avoid hypotension and nephrotoxic medications.

## 2023-07-06 NOTE — Progress Notes (Signed)
 Progress Note   Patient: Sabrina Stein ZOX:[id number] DOB: 15-May-1950 DOA: 07/02/2023     4 DOS: the patient was seen and examined on 07/06/2023   Brief hospital course: Mrs. Depaola was admitted to the hospital with the working diagnosis of aspiration pneumonia.   73 yo female with the past medical history of colitis, depression, GERD who presented with fatigue, vomiting, and generalized weakness. 3 days prior she was placed on ciprofloxacin and metronidazole for colitis. On her initial physical examination her blood pressure was 90/40, and she was hypoxic requiring supplemental 02 per [place] 5 L/min. Lungs with decreased breath sounds, and scattered rhonchi bilaterally, heart with S1 and S2 present and rhythmic with no gallops, abdomen with no distention,  positive tender to palpation, no lower extremity edema.   Patient was placed on IV antibiotic therapy for pneumonia.   04/11 patient with progressive and worsening respiratory failure, now up to heated high flow nasal cannula.   Assessment and Plan: * Acute anemia 04/07 PRBC transfusion x2  Sp Hgb has been stable, no signs of active bleeding  Continue close follow up cell count.   Acute hypoxemic respiratory failure (HCC) Patient has features of ILD with worsening infiltrates.  Possible aspiration pneumonia. ILD flare, TRALI 04/10 Chest radiograph with persistent bilateral infiltrates.  02 saturation is 94% on 40 L/min per HFNC Fi02 70%   Plan to add systemic corticosteroids with methylprednisolone 60 mg IV bid One dose of furosemide  40 mg IV  Continue antibiotic therapy with Unasyn IV  Heated high flow nasal cannula to keep 02 saturation 88% or greater.  Airway clearing techniques with flutter valve and incentive spirometer.  Bipap at night.  Check echocardiogram.  If not improving, plan to transfer to [facility] ICU.   AKI (acute kidney injury) (HCC) Renal function is improving with serum cr at 1.23 with K at 3,5 and serum bicarbonate at  22  Na 136   Plan for diuresis with furosemide 40 mg IV x1 Add oral Kcl to prevent hypokalemia.  Follow up renal function and electrolytes in am.  Avoid hypotension and nephrotoxic medications.   Essential hypertension Continue blood pressure monitoring.  Currently off antihypertensive medications.   Gastroesophageal reflux disease without esophagitis Continue with pantoprazole   Obesity (BMI 30-39.9) Calculated BMI is 33.0   Colitis Patient was treated with antibiotic therapy   Elevated liver enzymes due to hypoxemia.         Subjective: Patient continue to have dyspnea on exertion, no chest pain, no nausea or vomiting ,has been on Bipap at night   Physical Exam: Vitals:   07/06/23 0917 07/06/23 0918 07/06/23 1127 07/06/23 1137  BP:   (!) 120/51   Pulse:  (!) 104 87 96  Resp:  19 17 17   Temp:    98.8 F (37.1 C)  TempSrc:    Oral  SpO2: 95% 96% 94% 91%  Weight:      Height:       Neurology awake and alert, deconditioned ENT with mild pallor with no icterus Cardiovascular with S1 and S2 present and regular with no gallops, rubs or murmurs Respiratory with bilateral diffuse rales with no wheezing or rhonchi Abdomen with no distention  No lower extremity edema  Data Reviewed:    Family Communication: I spoke with patient's husband at the bedside, we talked in detail about patient's condition, plan of care and prognosis and all questions were addressed.   Disposition: Status is: Inpatient Remains inpatient appropriate because: critically ill.  On high flow oxygen requirements.  Critical care time 45 minutes.   Planned Discharge Destination: Home  Author: Coralie Keens, MD 07/06/2023 11:52 AM  For on call review [email or web address].

## 2023-07-06 NOTE — Progress Notes (Signed)
 Received report from [employer], RRT about patient being intubated shortly before shift change.  Tube was advance and is now at 24 Lip.  Called report to [place], RRT at [facility] covering 27M.  Family is at bedside with patient at this time and RN.  Patient has been started on pressors due to increased sedation for comfort while intubated.

## 2023-07-06 NOTE — Progress Notes (Signed)
 Wasted 200 mL of Fentanyl in stericycle with Sharen Heck RN.  Barbaraann Cao RN

## 2023-07-06 NOTE — Progress Notes (Signed)
 Received patient from [facility]. Their observation and mine is that the ET tube is 23cm at the lip. Will wait for follow up xray to determine if we need to advance tube. Patient is fighting vent. RN trying to get patient more comfortable with sedation.

## 2023-07-06 NOTE — ED Provider Notes (Signed)
 Procedure Name: Intubation Date/Time: 07/06/2023 6:56 PM  Performed by: Bethann Berkshire, MDPre-anesthesia Checklist: Patient identified, Patient being monitored, Emergency Drugs available, Timeout performed and Suction available Oxygen Delivery Method: Non-rebreather mask Preoxygenation: Pre-oxygenation with 100% oxygen Induction Type: Rapid sequence Ventilation: Mask ventilation without difficulty Laryngoscope Size: 1 Grade View: Grade I Tube size: 7.5 mm Number of attempts: 1 Placement Confirmation: ETT inserted through vocal cords under direct vision, CO2 detector and Breath sounds checked- equal and bilateral     Patient has multifocal pneumonia.  She needed to be intubated to transport to [facility].  The hospitalist asked me to intubate the patient.  She was intubated with a 7 and half tube on the first try without problems.  Bilateral breath sounds.  Tube was seen going through the cords   Bethann Berkshire, MD 07/06/23 1901

## 2023-07-06 NOTE — Assessment & Plan Note (Signed)
 Continue blood pressure monitoring.  Currently off antihypertensive medications.

## 2023-07-06 NOTE — Progress Notes (Signed)
 Patient restless, fighting ventilator. [facility] & this [employer], operational settling patient before transferring. Sedation titrated for optimal RASS score and NE for normotension. Dr. Ella Jubilee aware. New IV placed.

## 2023-07-06 NOTE — Assessment & Plan Note (Signed)
 Calculated BMI is 33.0

## 2023-07-06 NOTE — Assessment & Plan Note (Addendum)
 Patient was treated with antibiotic therapy   Elevated liver enzymes due to hypoxemia.

## 2023-07-06 NOTE — Progress Notes (Signed)
   PCCM transfer request     Sending physician: Arrien  Sending facility: [facility] ICU  Reason for transfer: respiratory failure worsening  Brief case summary: Admitted 4/7 with multilobar pneumonia, has had progressively worsening respiratory failure. On HHFNC 40L, 80%, now looking more tired. Didn't improve when she received lasix earlier. Worried she may need higher level of care and intubation. On unasyn & doxy with steroids.  Recommendations made prior to transfer: intubation  Transfer accepted: yes    Sabrina Stein 07/06/23 6:43 PM [place]  For contact information, see Amion. If no response to pager, please call PCCM consult pager. After hours, 7PM- 7AM, please call Elink.

## 2023-07-06 NOTE — Progress Notes (Signed)
 20mg  of etomidate & 120mg  of succs given. Patient intubated by Dr. Estell Harpin @ [phone] with a 7.5, 23cm @ the lip.

## 2023-07-07 DIAGNOSIS — J9601 Acute respiratory failure with hypoxia: Secondary | ICD-10-CM | POA: Diagnosis not present

## 2023-07-07 DIAGNOSIS — A419 Sepsis, unspecified organism: Secondary | ICD-10-CM | POA: Diagnosis not present

## 2023-07-07 DIAGNOSIS — J189 Pneumonia, unspecified organism: Secondary | ICD-10-CM | POA: Diagnosis not present

## 2023-07-07 DIAGNOSIS — D649 Anemia, unspecified: Secondary | ICD-10-CM | POA: Diagnosis not present

## 2023-07-07 LAB — GLUCOSE, CAPILLARY
Glucose-Capillary: 163 mg/dL — ABNORMAL HIGH (ref 70–99)
Glucose-Capillary: 155 mg/dL — ABNORMAL HIGH (ref 70–99)
Glucose-Capillary: 159 mg/dL — ABNORMAL HIGH (ref 70–99)
Glucose-Capillary: 127 mg/dL — ABNORMAL HIGH (ref 70–99)
Glucose-Capillary: 114 mg/dL — ABNORMAL HIGH (ref 70–99)
Glucose-Capillary: 149 mg/dL — ABNORMAL HIGH (ref 70–99)

## 2023-07-07 LAB — RESPIRATORY PANEL BY PCR

## 2023-07-07 LAB — CBC WITH DIFFERENTIAL/PLATELET
Abs Immature Granulocytes: 0.48 10*3/uL — ABNORMAL HIGH (ref 0.00–0.07)
Basophils Absolute: 0 10*3/uL (ref 0.0–0.1)
Basophils Relative: 0 %
Eosinophils Absolute: 0 10*3/uL (ref 0.0–0.5)
Eosinophils Relative: 0 %
HCT: 26.1 % — ABNORMAL LOW (ref 36.0–46.0)
Hemoglobin: 8.3 g/dL — ABNORMAL LOW (ref 12.0–15.0)
Immature Granulocytes: 3 %
Lymphocytes Relative: 7 %
Lymphs Abs: 1.3 10*3/uL (ref 0.7–4.0)
MCH: 30 pg (ref 26.0–34.0)
MCHC: 31.8 g/dL (ref 30.0–36.0)
MCV: 94.2 fL (ref 80.0–100.0)
Monocytes Absolute: 0.7 10*3/uL (ref 0.1–1.0)
Monocytes Relative: 4 %
Neutro Abs: 14.9 10*3/uL — ABNORMAL HIGH (ref 1.7–7.7)
Neutrophils Relative %: 86 %
Platelets: 445 10*3/uL — ABNORMAL HIGH (ref 150–400)
RBC: 2.77 MIL/uL — ABNORMAL LOW (ref 3.87–5.11)
RDW: 15.9 % — ABNORMAL HIGH (ref 11.5–15.5)
WBC: 17.5 10*3/uL — ABNORMAL HIGH (ref 4.0–10.5)
nRBC: 0.9 % — ABNORMAL HIGH (ref 0.0–0.2)

## 2023-07-07 LAB — COMPREHENSIVE METABOLIC PANEL WITH GFR
ALT: 43 U/L (ref 0–44)
AST: 55 U/L — ABNORMAL HIGH (ref 15–41)
Albumin: 1.8 g/dL — ABNORMAL LOW (ref 3.5–5.0)
Alkaline Phosphatase: 476 U/L — ABNORMAL HIGH (ref 38–126)
Anion gap: 15 (ref 5–15)
BUN: 37 mg/dL — ABNORMAL HIGH (ref 8–23)
CO2: 24 mmol/L (ref 22–32)
Calcium: 8.5 mg/dL — ABNORMAL LOW (ref 8.9–10.3)
Chloride: 98 mmol/L (ref 98–111)
Creatinine, Ser: 1.91 mg/dL — ABNORMAL HIGH (ref 0.44–1.00)
GFR, Estimated: 28 mL/min — ABNORMAL LOW (ref >60)
Glucose, Bld: 183 mg/dL — ABNORMAL HIGH (ref 70–99)
Potassium: 4.6 mmol/L (ref 3.5–5.1)
Sodium: 137 mmol/L (ref 135–145)
Total Bilirubin: 2.4 mg/dL — ABNORMAL HIGH (ref 0.0–1.2)
Total Protein: 6.8 g/dL (ref 6.5–8.1)

## 2023-07-07 LAB — POCT I-STAT 7, (LYTES, BLD GAS, ICA,H+H)
Acid-Base Excess: 2 mmol/L (ref 0.0–2.0)
Bicarbonate: 27.4 mmol/L (ref 20.0–28.0)
Calcium, Ion: 1.11 mmol/L — ABNORMAL LOW (ref 1.15–1.40)
HCT: 26 % — ABNORMAL LOW (ref 36.0–46.0)
Hemoglobin: 8.8 g/dL — ABNORMAL LOW (ref 12.0–15.0)
O2 Saturation: 100 %
Patient temperature: 97.7
Potassium: 3.9 mmol/L (ref 3.5–5.1)
Sodium: 137 mmol/L (ref 135–145)
TCO2: 29 mmol/L (ref 22–32)
pCO2 arterial: 46.9 mmHg (ref 32–48)
pH, Arterial: 7.371 (ref 7.35–7.45)
pO2, Arterial: 289 mmHg — ABNORMAL HIGH (ref 83–108)

## 2023-07-07 LAB — MRSA NEXT GEN BY PCR, NASAL: MRSA by PCR Next Gen: NOT DETECTED

## 2023-07-07 LAB — MAGNESIUM
Magnesium: 2 mg/dL (ref 1.7–2.4)
Magnesium: 2.1 mg/dL (ref 1.7–2.4)
Magnesium: 2.1 mg/dL (ref 1.7–2.4)

## 2023-07-07 LAB — TRIGLYCERIDES: Triglycerides: 160 mg/dL — ABNORMAL HIGH (ref <150)

## 2023-07-07 LAB — PROCALCITONIN: Procalcitonin: 2.34 ng/mL

## 2023-07-07 LAB — BRAIN NATRIURETIC PEPTIDE: B Natriuretic Peptide: 1037.9 pg/mL — ABNORMAL HIGH (ref 0.0–100.0)

## 2023-07-07 LAB — PHOSPHORUS
Phosphorus: 7.8 mg/dL — ABNORMAL HIGH (ref 2.5–4.6)
Phosphorus: 7.9 mg/dL — ABNORMAL HIGH (ref 2.5–4.6)

## 2023-07-07 LAB — STREP PNEUMONIAE URINARY ANTIGEN: Strep Pneumo Urinary Antigen: NEGATIVE

## 2023-07-07 MED ORDER — METHYLPREDNISOLONE SODIUM SUCC 40 MG IJ SOLR
40.0000 mg | Freq: Two times a day (BID) | INTRAMUSCULAR | Status: DC
  Administered 2023-07-07 – 2023-07-09 (×5): 40 mg via INTRAVENOUS
  Filled 2023-07-07 (×6): qty 1

## 2023-07-07 MED ORDER — LACTATED RINGERS IV BOLUS: 500.0000 mL | Freq: Once | INTRAVENOUS | Status: DC

## 2023-07-07 MED ORDER — THIAMINE MONONITRATE 100 MG PO TABS
100.0000 mg | ORAL_TABLET | Freq: Every day | ORAL | Status: DC
  Administered 2023-07-07 – 2023-07-08 (×2): 100 mg
  Filled 2023-07-07 (×2): qty 1

## 2023-07-07 MED ORDER — ONDANSETRON HCL 4 MG/2ML IJ SOLN
4.0000 mg | Freq: Four times a day (QID) | INTRAMUSCULAR | Status: DC | PRN
  Administered 2023-07-08 – 2023-07-10 (×6): 4 mg via INTRAVENOUS
  Filled 2023-07-07 (×7): qty 2

## 2023-07-07 MED ORDER — WHITE PETROLATUM EX OINT
TOPICAL_OINTMENT | CUTANEOUS | Status: DC | PRN
  Filled 2023-07-07: qty 28.35

## 2023-07-07 MED ORDER — ONDANSETRON HCL 4 MG PO TABS: 4.0000 mg | ORAL_TABLET | Freq: Four times a day (QID) | ORAL | Status: DC | PRN

## 2023-07-07 MED ORDER — PROSOURCE TF20 ENFIT COMPATIBL EN LIQD
60.0000 mL | Freq: Every day | ENTERAL | Status: DC
  Administered 2023-07-07: 60 mL
  Filled 2023-07-07: qty 60

## 2023-07-07 MED ORDER — INSULIN ASPART 100 UNIT/ML IJ SOLN
0.0000 [IU] | INTRAMUSCULAR | Status: DC
  Administered 2023-07-07: 2 [IU] via SUBCUTANEOUS
  Administered 2023-07-07: 1 [IU] via SUBCUTANEOUS
  Administered 2023-07-07: 2 [IU] via SUBCUTANEOUS
  Administered 2023-07-07: 1 [IU] via SUBCUTANEOUS
  Administered 2023-07-07: 2 [IU] via SUBCUTANEOUS
  Administered 2023-07-08 – 2023-07-10 (×4): 1 [IU] via SUBCUTANEOUS

## 2023-07-07 MED ORDER — VITAL AF 1.2 CAL PO LIQD
1000.0000 mL | ORAL | Status: DC
  Administered 2023-07-07 – 2023-07-08 (×2): 1000 mL

## 2023-07-07 MED ORDER — VENLAFAXINE HCL 37.5 MG PO TABS
37.5000 mg | ORAL_TABLET | Freq: Two times a day (BID) | ORAL | Status: DC
  Administered 2023-07-07 – 2023-07-08 (×2): 37.5 mg
  Filled 2023-07-07 (×4): qty 1

## 2023-07-07 MED ORDER — LACTATED RINGERS IV BOLUS
500.0000 mL | Freq: Once | INTRAVENOUS | Status: AC
  Administered 2023-07-07: 500 mL via INTRAVENOUS

## 2023-07-07 MED ORDER — ACETAMINOPHEN 325 MG PO TABS: 650.0000 mg | ORAL_TABLET | Freq: Four times a day (QID) | ORAL | Status: DC | PRN

## 2023-07-07 MED ORDER — VITAL HIGH PROTEIN PO LIQD
1000.0000 mL | ORAL | Status: DC
  Administered 2023-07-07: 1000 mL

## 2023-07-07 MED ORDER — VITAL AF 1.2 CAL PO LIQD
1000.0000 mL | ORAL | Status: DC
  Administered 2023-07-07: 1000 mL

## 2023-07-07 MED ORDER — CISATRACURIUM BESYLATE 20 MG/10ML IV SOLN
0.1000 mg/kg | Freq: Once | INTRAVENOUS | Status: AC | PRN
  Administered 2023-07-07: 8 mg via INTRAVENOUS
  Filled 2023-07-07: qty 10

## 2023-07-07 MED ORDER — CISATRACURIUM BESYLATE 20 MG/10ML IV SOLN
10.0000 mg | Freq: Once | INTRAVENOUS | Status: AC
  Administered 2023-07-07: 10 mg via INTRAVENOUS
  Filled 2023-07-07: qty 10

## 2023-07-07 MED ORDER — ATORVASTATIN CALCIUM 10 MG PO TABS
20.0000 mg | ORAL_TABLET | Freq: Every day | ORAL | Status: DC
  Administered 2023-07-08: 20 mg
  Filled 2023-07-07: qty 2

## 2023-07-07 MED ORDER — ACETAMINOPHEN 650 MG RE SUPP
650.0000 mg | Freq: Four times a day (QID) | RECTAL | Status: DC | PRN
  Administered 2023-07-09 – 2023-07-10 (×2): 650 mg via RECTAL
  Filled 2023-07-07 (×2): qty 1

## 2023-07-07 NOTE — Progress Notes (Addendum)
 Initial Nutrition Assessment  DOCUMENTATION CODES:   Not applicable  INTERVENTION:   Tube feeding via OG tube: Vital AF 1.2 at 20 ml/h, increase by 10 ml every 8 hours to goal rate of 60 ml/h (1440 ml per day) Provides 1728 kcal, 108 gm protein, 1100 ml free water daily  Monitor magnesium, potassium, and phosphorus BID for at least 3 days, MD to replete as needed, as pt is at risk for refeeding syndrome given recent weight loss and recent minimal intake. Add Thiamine 100 mg daily for 7 days.  NUTRITION DIAGNOSIS:   Inadequate oral intake related to inability to eat as evidenced by NPO status.  GOAL:   Patient will meet greater than or equal to 90% of their needs  MONITOR:   Vent status, TF tolerance  REASON FOR ASSESSMENT:   Consult Enteral/tube feeding initiation and management  ASSESSMENT:   73 yo female admitted to [facility] 4/7 with multifocal PNA. Transferred to [facility] 4/11 with hypoxic respiratory failure requiring intubation. PMH includes HLD, depression, GERD, anxiety, arthritis.  Received MD Consult for TF initiation and management. OG tube in place.    Patient is currently intubated on ventilator support. MV: 7.3 L/min Temp (24hrs), Avg:98.2 F (36.8 C), Min:97.7 F (36.5 C), Max:98.8 F (37.1 C)  Propofol: 19.7 ml/hr providing 520 kcal daily.  Labs reviewed.  CBG: [phone]  Medications reviewed and include novolog, solu-medrol, fentanyl, levophed, propofol.   Weight history reviewed. Patient with 7% weight loss over the past 2 months. Per chart review, patient was diagnosed with colitis after colonoscopy on 3/28. She had persistent weakness, N/V, abdominal pain until she came to [facility] on 4/7 for further evaluation. Patient is at increased nutrition risk, including risk of refeeding syndrome. She has only been on clear liquids since admission 5 days ago. Currently NPO.   NUTRITION - FOCUSED PHYSICAL EXAM:  Unable to complete  Diet Order:   Diet Order              Diet NPO time specified  Diet effective now                   EDUCATION NEEDS:   No education needs have been identified at this time  Skin:  Skin Assessment: Skin Integrity Issues: Skin Integrity Issues:: Stage III Stage III: coccyx  Last BM:  4/8 type 2  Height:   Ht Readings from Last 1 Encounters:  07/06/23 5\' 2"  (1.575 m)    Weight:   Wt Readings from Last 1 Encounters:  07/07/23 80.3 kg    Ideal Body Weight:  50 kg  BMI:  Body mass index is 32.38 kg/m.  Estimated Nutritional Needs:   Kcal:  1550-1750  Protein:  100-115 gm  Fluid:  >/= 1.6 L   Barnet Boots RD, LDN, CNSC Contact via secure chat. If unavailable, use group chat "RD Inpatient."

## 2023-07-07 NOTE — Progress Notes (Signed)
 NAME:  Myalynn Lingle, MRN:  [id number], DOB:  Feb 03, 1951, LOS: 5 ADMISSION DATE:  07/02/2023, CONSULTATION DATE:  4/11 REFERRING MD:  Velvet Gibbs , CHIEF COMPLAINT:  respiratory failure   History of Present Illness:  MS. Nottingham is a 73 y/o woman with a history of GERD, Depression/anxiety, former tobacco use who presented [facility] ED w/ hypoxic respiratory failure.  Patient underwent colonoscopy on 3/28 showing colitis. Started empirically on cipro/flagyl. Patient having persistent weakness, N/V, and abd pain, and now with sob.  Denies bloody stools or fever.  Came to [facility] on 4/7 for further eval. on arrival patient hypothermic 94.5 F with soft BP.  WBC 15.8.  Cultures obtained, given IV fluids, started on broad-spectrum antibiotics.  Patient also with hypoxia requiring nasal cannula.  CXR concerning for multifocal pneumonia.  Creat elevated 3.09 and elevated LFTs.  Hemoglobin down to 6 and Hemoccult negative.  Transfuse PRBC.  Started on Protonix.  GI consulted.  Also with elevated BNP and troponins.  Cardiology consulted suspect demand ischemia.  Echo showing EF 55 to 70%; grade 1 diastolic.  CT chest, abdomen, pelvis showing multifocal pneumonia.  Patient admitted to floors.  Throughout stay patient with increasing O2 requirements.  On 4/11 now on heated high flow 40L, 80%.  Started on IV steroids and given Lasix.  On Unasyn/doxy.  Patient looking more tired and decision made to proceed with intubation.  PCCM consulted for transfer, will admit to [facility].    Pertinent  Medical History  GERD HLD Depression  Significant Hospital Events: Including procedures, antibiotic start and stop dates in addition to other pertinent events   4/7 admitted to [facility] 4/11 worsening hypoxia requiring intubation; transferred to [facility]; PCCM consulted  Interim History / Subjective:  Vent weaned to FIO2 75% S/ nimbex x 2 for vent dyssynchrony overnight Objective   Blood pressure (!) 98/50, pulse 92, temperature 97.7 F (36.5  C), temperature source Oral, resp. rate (!) 36, height 5\' 2"  (1.575 m), weight 80.3 kg, SpO2 97%.    Vent Mode: PRVC FiO2 (%):  [60 %-100 %] 75 % Set Rate:  [18 bmp] 18 bmp Vt Set:  [400 mL-450 mL] 400 mL PEEP:  [5 cmH20-10 cmH20] 10 cmH20 Plateau Pressure:  [15 cmH20-17 cmH20] 15 cmH20   Intake/Output Summary (Last 24 hours) at 07/07/2023 0739 Last data filed at 07/07/2023 0600 Gross per 24 hour  Intake 1081.87 ml  Output 1550 ml  Net -468.13 ml   Filed Weights   07/06/23 0420 07/06/23 2215 07/07/23 0407  Weight: 82 kg 80.3 kg 80.3 kg   Physical Exam: General: Critically ill-appearing, sedated HENT: [place], AT, ETT in place Eyes: EOMI, no scleral icterus Respiratory: Diminished to auscultation bilaterally.  No crackles, wheezing or rales Cardiovascular: RRR, -M/R/G, no JVD GI: BS+, soft, nontender Extremities:-Edema,-tenderness Neuro: Sedated GU: Foley in place  Imaging, labs and test in EMR in the last 24 hours reviewed independently by me. Pertinent findings below: P:F ratio on ABG with improving oxygenation BUN/Cr 37/1.91, worsening BNP 1037 WBC 17.5 increasing CXR Bilateral interstitial opacities Resolved Hospital Problem list     Assessment & Plan:   Acute respiratory failure with hypoxia Multifocal pneumonia: Possible aspiration. Strep neg. MRSA neg Plan: -Full vent support -LTVV, 4-8cc/kg IBW with goal Pplat<30 and DP<15 -VAP bundle in place -Daily SAT and SBT when eligible -PAD protocol. RASS goal 0 to -1 -Zosyn and cont doxy -F/u tracheal aspirate, Legionella, RVP -BID DUonebs -IV steroids -Hold on further diuresis  Acute anemia -s/p 2  units PRBC 4/7; fecal occult negative -hx of diclofenac use Plan: -cont PPI BID -trend cbc. Hg stable -will likely need GI consult in am for possible EGD   Septic shock +/- sedation-related hypotension Plan: -wean sedation for RASS 0 to -1 -Wean levophed for map goal >65  AKI - initially 3.09 with improvement,  worsening last 24 hours Plan: -Trend BMP / urinary output -Replace electrolytes as indicated -Avoid nephrotoxic agents, ensure adequate renal perfusion  Colitis N/V/abdominal pain -seen on colonoscopy on 3/28 -treated with cipro/flagyl outpt on 4/4 then on unasyn 4/7 on admission Plan: -zosyn as above  HTN HLD Plan: -hold anti-htn meds -statin  Elevated LFTs Plan: -improving -cont to trend  Anxiety/depression Plan: -cont effexor -hold xanax   Best Practice (right click and "Reselect all SmartList Selections" daily)   Diet/type: tubefeeds DVT prophylaxis SCD Pressure ulcer(s): N/A GI prophylaxis: PPI Lines: N/A Foley:  Yes, and it is still needed Code Status:  full code Last date of multidisciplinary goals of care discussion [4/11 update spouse Darrow End over phone]  Critical care time: 45 minutes    The patient is critically ill with multiple organ systems failure and requires high complexity decision making for assessment and support, frequent evaluation and titration of therapies, application of advanced monitoring technologies and extensive interpretation of multiple databases.  Independent Critical Care Time: 45 Minutes.   Genetta Kenning, M.D. [facility] Pulmonary/Critical Care Medicine 07/07/2023 7:40 AM   Please see Amion for pager number to reach on-call Pulmonary and Critical Care Team.

## 2023-07-07 NOTE — Progress Notes (Addendum)
 Was advised by CCM to titrate sedation to vent synchrony. Depside maxing Prop and increasing Fent + Fent boluses, pt still frequently double stacking on the vent at a -4 RASS. Elink notified.  Versed push given without relief.   0240: 10mg  of Nimbex given. Pt synchronous on vent at this time.

## 2023-07-07 NOTE — Progress Notes (Signed)
 eLink Physician-Brief Progress Note Patient Name: Sabrina Stein DOB: 1950-12-03 MRN: [id number]   Date of Service  07/07/2023  HPI/Events of Note  Patient with oliguria, with an empty bladder on bladder scan. She is also still mildly dyssynchronous (significantly improved). Unfortunately she does not have a central line for CVP measurement access.  eICU Interventions  BNP ordered to try to assess volume status. One more PRN dose of Nimbex ordered.        Cesia Orf U Maurianna Benard 07/07/2023, 5:10 AM

## 2023-07-07 NOTE — Progress Notes (Signed)
 eLink Physician-Brief Progress Note Patient Name: Sabrina Stein DOB: August 26, 1950 MRN: [id number]   Date of Service  07/07/2023  HPI/Events of Note  Patient with ventilator dyssynchrony despite deep sedation with Fentanyl + Propofol gtt and a PRN dose of Versed.  eICU Interventions  Will give a 10 mg dose of iv Nimbex x 1 to try to interrupt and reset patient's respiratory drive.        Estefana Taylor U Loree Shehata 07/07/2023, 2:21 AM

## 2023-07-07 NOTE — Progress Notes (Signed)
 RT NOTE: patient does not meet SBT criteria for this AM due to PEEP, and FIO2, requirements.  Tolerating current ventilator settings well at this time.  Was able to wean FIO2 to 70% with sats maintaining at 95%.  Will continue to monitor and wean as tolerated.

## 2023-07-08 ENCOUNTER — Inpatient Hospital Stay (HOSPITAL_COMMUNITY)

## 2023-07-08 DIAGNOSIS — J189 Pneumonia, unspecified organism: Secondary | ICD-10-CM | POA: Diagnosis not present

## 2023-07-08 DIAGNOSIS — A419 Sepsis, unspecified organism: Secondary | ICD-10-CM | POA: Diagnosis not present

## 2023-07-08 DIAGNOSIS — D649 Anemia, unspecified: Secondary | ICD-10-CM | POA: Diagnosis not present

## 2023-07-08 DIAGNOSIS — J9601 Acute respiratory failure with hypoxia: Secondary | ICD-10-CM | POA: Diagnosis not present

## 2023-07-08 LAB — BASIC METABOLIC PANEL WITH GFR
Anion gap: 17 — ABNORMAL HIGH (ref 5–15)
BUN: 64 mg/dL — ABNORMAL HIGH (ref 8–23)
CO2: 21 mmol/L — ABNORMAL LOW (ref 22–32)
Calcium: 8.2 mg/dL — ABNORMAL LOW (ref 8.9–10.3)
Chloride: 97 mmol/L — ABNORMAL LOW (ref 98–111)
Creatinine, Ser: 3.22 mg/dL — ABNORMAL HIGH (ref 0.44–1.00)
GFR, Estimated: 15 mL/min — ABNORMAL LOW (ref >60)
Glucose, Bld: 134 mg/dL — ABNORMAL HIGH (ref 70–99)
Potassium: 4.3 mmol/L (ref 3.5–5.1)
Sodium: 135 mmol/L (ref 135–145)

## 2023-07-08 LAB — MAGNESIUM
Magnesium: 2.2 mg/dL (ref 1.7–2.4)
Magnesium: 2.1 mg/dL (ref 1.7–2.4)

## 2023-07-08 LAB — HEPATIC FUNCTION PANEL
ALT: 31 U/L (ref 0–44)
AST: 31 U/L (ref 15–41)
Albumin: 1.7 g/dL — ABNORMAL LOW (ref 3.5–5.0)
Alkaline Phosphatase: 297 U/L — ABNORMAL HIGH (ref 38–126)
Bilirubin, Direct: 1.6 mg/dL — ABNORMAL HIGH (ref 0.0–0.2)
Indirect Bilirubin: 1.1 mg/dL — ABNORMAL HIGH (ref 0.3–0.9)
Total Bilirubin: 2.7 mg/dL — ABNORMAL HIGH (ref 0.0–1.2)
Total Protein: 6.8 g/dL (ref 6.5–8.1)

## 2023-07-08 LAB — PHOSPHORUS
Phosphorus: 9.3 mg/dL — ABNORMAL HIGH (ref 2.5–4.6)
Phosphorus: 7.7 mg/dL — ABNORMAL HIGH (ref 2.5–4.6)

## 2023-07-08 LAB — GLUCOSE, CAPILLARY
Glucose-Capillary: 146 mg/dL — ABNORMAL HIGH (ref 70–99)
Glucose-Capillary: 126 mg/dL — ABNORMAL HIGH (ref 70–99)
Glucose-Capillary: 146 mg/dL — ABNORMAL HIGH (ref 70–99)
Glucose-Capillary: 119 mg/dL — ABNORMAL HIGH (ref 70–99)
Glucose-Capillary: 103 mg/dL — ABNORMAL HIGH (ref 70–99)
Glucose-Capillary: 98 mg/dL (ref 70–99)

## 2023-07-08 LAB — CBC
HCT: 23.6 % — ABNORMAL LOW (ref 36.0–46.0)
HCT: 22.3 % — ABNORMAL LOW (ref 36.0–46.0)
Hemoglobin: 7 g/dL — ABNORMAL LOW (ref 12.0–15.0)
Hemoglobin: 6.9 g/dL — CL (ref 12.0–15.0)
MCH: 29 pg (ref 26.0–34.0)
MCH: 29.6 pg (ref 26.0–34.0)
MCHC: 29.7 g/dL — ABNORMAL LOW (ref 30.0–36.0)
MCHC: 30.9 g/dL (ref 30.0–36.0)
MCV: 97.9 fL (ref 80.0–100.0)
MCV: 95.7 fL (ref 80.0–100.0)
Platelets: 465 10*3/uL — ABNORMAL HIGH (ref 150–400)
Platelets: 337 10*3/uL (ref 150–400)
RBC: 2.41 MIL/uL — ABNORMAL LOW (ref 3.87–5.11)
RBC: 2.33 MIL/uL — ABNORMAL LOW (ref 3.87–5.11)
RDW: 16.1 % — ABNORMAL HIGH (ref 11.5–15.5)
RDW: 16 % — ABNORMAL HIGH (ref 11.5–15.5)
WBC: 16.4 10*3/uL — ABNORMAL HIGH (ref 4.0–10.5)
WBC: 12.5 10*3/uL — ABNORMAL HIGH (ref 4.0–10.5)
nRBC: 0.7 % — ABNORMAL HIGH (ref 0.0–0.2)
nRBC: 0.6 % — ABNORMAL HIGH (ref 0.0–0.2)

## 2023-07-08 LAB — PREPARE RBC (CROSSMATCH)

## 2023-07-08 LAB — LEGIONELLA PNEUMOPHILA SEROGP 1 UR AG: L. pneumophila Serogp 1 Ur Ag: NEGATIVE

## 2023-07-08 MED ORDER — PROCHLORPERAZINE EDISYLATE 10 MG/2ML IJ SOLN
5.0000 mg | Freq: Once | INTRAMUSCULAR | Status: AC
  Administered 2023-07-08: 5 mg via INTRAVENOUS
  Filled 2023-07-08: qty 1

## 2023-07-08 MED ORDER — SODIUM CHLORIDE 0.9% IV SOLUTION: Freq: Once | INTRAVENOUS | Status: DC

## 2023-07-08 MED ORDER — LACTATED RINGERS IV SOLN: INTRAVENOUS | Status: DC

## 2023-07-08 MED ORDER — PIPERACILLIN-TAZOBACTAM IN DEX 2-0.25 GM/50ML IV SOLN
2.2500 g | Freq: Three times a day (TID) | INTRAVENOUS | Status: AC
  Administered 2023-07-08 – 2023-07-10 (×8): 2.25 g via INTRAVENOUS
  Filled 2023-07-08 (×8): qty 50

## 2023-07-08 MED ORDER — PROCHLORPERAZINE EDISYLATE 10 MG/2ML IJ SOLN
5.0000 mg | Freq: Once | INTRAMUSCULAR | Status: AC
  Administered 2023-07-09: 5 mg via INTRAVENOUS
  Filled 2023-07-08: qty 1

## 2023-07-08 MED ORDER — DEXMEDETOMIDINE HCL IN NACL 400 MCG/100ML IV SOLN
0.0000 ug/kg/h | INTRAVENOUS | Status: DC
  Administered 2023-07-08: 0.4 ug/kg/h via INTRAVENOUS
  Filled 2023-07-08: qty 100

## 2023-07-08 NOTE — Progress Notes (Signed)
 RT NOTE: patient does not meet SBT criteria at this time due to PEEP requirements.  Weaned PEEP from 10 to 8 and patient is tolerating well at this time.  Will continue to monitor.

## 2023-07-08 NOTE — Procedures (Signed)
 Extubation Procedure Note  Patient Details:   Name: Sabrina Stein DOB: [birth year] MRN: [id number]   Airway Documentation:    Vent end date: 07/08/23 Vent end time: 1530   Evaluation  O2 sats: currently acceptable Complications: No apparent complications Patient did tolerate procedure well. Bilateral Breath Sounds: Clear   Yes  Patient extubated per MD order.  Patient was first placed on 6L [place] however patient's sats dropped to low 80s.  Patient was placed on 10L [place] and is tolerating well at this time.  Positive cuff leak noted.  No evidence of stridor.  Patient able to speak post extubation.  Will continue to monitor.   Levell Reach 07/08/2023, 3:39 PM

## 2023-07-08 NOTE — Progress Notes (Signed)
 eLink Physician-Brief Progress Note Patient Name: Sabrina Stein DOB: 12/26/1950 MRN: [id number]   Date of Service  07/08/2023  HPI/Events of Note  E link asked to follow up on 6 pm cbc. Hb is 6.9/ It was 7 this AM. No distress on camera  eICU Interventions  1 unit RBC to be transfused RN to verify consent     Intervention Category Major Interventions: Hemorrhage - evaluation and management  Malina Geers G Andie Mungin 07/08/2023, 7:29 PM  Addendum at 330 am - Has had issues with nausea overnight. Given 2 doses of compazine. Last at around midnight, Was being turned just now and threw up bilious material. No bloody emesis. KUB done earlier also did not show anything acute (prior contrast in colon, maybe from CT that she had done earlier but I cannot pull up those images). Will give Zofran. No distress on camera. Completed her transfusion. Electrolytes to be checked this AM. Belly is soft and non tender when RN palpated

## 2023-07-08 NOTE — Progress Notes (Signed)
 NAME:  Sabrina Stein, MRN:  [id number], DOB:  [birth year], LOS: 6 ADMISSION DATE:  07/02/2023, CONSULTATION DATE:  4/11 REFERRING MD:  Velvet Gibbs , CHIEF COMPLAINT:  respiratory failure   History of Present Illness:  Sabrina Stein is a 73 y/o woman with a history of GERD, Depression/anxiety, former tobacco use who presented [facility] ED w/ hypoxic respiratory failure.  Patient underwent colonoscopy on 3/28 showing colitis. Started empirically on cipro/flagyl. Patient having persistent weakness, N/V, and abd pain, and now with sob.  Denies bloody stools or fever.  Came to [facility] on 4/7 for further eval. on arrival patient hypothermic 94.5 F with soft BP.  WBC 15.8.  Cultures obtained, given IV fluids, started on broad-spectrum antibiotics.  Patient also with hypoxia requiring nasal cannula.  CXR concerning for multifocal pneumonia.  Creat elevated 3.09 and elevated LFTs.  Hemoglobin down to 6 and Hemoccult negative.  Transfuse PRBC.  Started on Protonix.  GI consulted.  Also with elevated BNP and troponins.  Cardiology consulted suspect demand ischemia.  Echo showing EF 55 to 70%; grade 1 diastolic.  CT chest, abdomen, pelvis showing multifocal pneumonia.  Patient admitted to floors.  Throughout stay patient with increasing O2 requirements.  On 4/11 now on heated high flow 40L, 80%.  Started on IV steroids and given Lasix.  On Unasyn/doxy.  Patient looking more tired and decision made to proceed with intubation.  PCCM consulted for transfer, will admit to [facility].    Pertinent  Medical History  GERD HLD Depression  Significant Hospital Events: Including procedures, antibiotic start and stop dates in addition to other pertinent events   4/7 admitted to [facility] 4/11 worsening hypoxia requiring intubation; transferred to [facility]; PCCM consulted 4/12 Improved FIO2 and pressor requirement  Interim History / Subjective:   On FIO2 40-50% PEEP 10 Updated daughter at bedside Objective   Blood pressure (!) 94/56, pulse 82,  temperature 98 F (36.7 C), temperature source Axillary, resp. rate 18, height 5\' 2"  (1.575 m), weight 80.5 kg, SpO2 92%.    Vent Mode: PRVC FiO2 (%):  [40 %-70 %] 40 % Set Rate:  [18 bmp] 18 bmp Vt Set:  [400 mL] 400 mL PEEP:  [10 cmH20] 10 cmH20 Plateau Pressure:  [20 cmH20-24 cmH20] 20 cmH20   Intake/Output Summary (Last 24 hours) at 07/08/2023 0739 Last data filed at 07/08/2023 0600 Gross per 24 hour  Intake 4804.1 ml  Output 325 ml  Net 4479.1 ml   Filed Weights   07/06/23 2215 07/07/23 0407 07/08/23 0316  Weight: 80.3 kg 80.3 kg 80.5 kg   Physical Exam: General: Chronically ill-appearing, sedated HENT: Sabrina Stein, AT, ETT in place Eyes: EOMI, no scleral icterus Respiratory: Diminished but clear to auscultation bilaterally.  No crackles, wheezing or rales Cardiovascular: RRR, -M/R/G, no JVD GI: BS+, soft, nontender Extremities:-Edema,-tenderness Neuro: Sedated GU: Foley in place  Imaging, labs and test in EMR in the last 24 hours reviewed independently by me. Pertinent findings below:  4/12 BUN/Cr 37/1.91, worsening BNP 1037 WBC 17.5 increasing CXR Bilateral interstitial opacities  4/13 Not collected Resolved Hospital Problem list     Assessment & Plan:   Acute respiratory failure with hypoxia Multifocal pneumonia: Possible aspiration. Strep neg. MRSA neg, RVP neg Plan: -Full vent support -LTVV, 4-8cc/kg IBW with goal Pplat<30 and DP<15 -VAP bundle in place -Daily SAT and SBT when eligible -PAD protocol. RASS goal 0 to -1. Wean propofol -Zosyn and cont doxy -F/u tracheal aspirate, Legionella -BID DUonebs -IV steroids -Hold on further diuresis  Acute anemia -s/p 2 units PRBC 4/7; fecal occult negative -hx of diclofenac use Plan: -cont PPI BID -trend cbc. Hg stable -will likely need GI consult in am for possible EGD   Septic shock +/- sedation-related hypotension Plan: -S/p IVF resuscitation including 1L bolus 4/12 -wean sedation for RASS 0 to  -1 -Wean levophed for map goal >65  AKI - initially 3.09 with improvement, worsening. Decreased UOP in the last 24 hours Plan: -Trend BMP / urinary output -Replace electrolytes as indicated -Avoid nephrotoxic agents, ensure adequate renal perfusion  Colitis N/V/abdominal pain -seen on colonoscopy on 3/28 -treated with cipro/flagyl outpt on 4/4 then on unasyn 4/7 on admission Plan: -zosyn as above  HTN HLD Plan: -hold anti-htn meds -statin  Elevated LFTs Plan: -improving -cont to trend  Anxiety/depression Plan: -cont effexor -hold xanax   Best Practice (right click and "Reselect all SmartList Selections" daily)   Diet/type: tubefeeds DVT prophylaxis SCD Pressure ulcer(s): N/A GI prophylaxis: PPI Lines: N/A Foley:  Yes, and it is still needed Code Status:  full code Last date of multidisciplinary goals of care discussion [4/12] Confirmed code status with husband and daughter  Updated daughter at bedside 4/13  Critical care time: 38 minutes    The patient is critically ill with multiple organ systems failure and requires high complexity decision making for assessment and support, frequent evaluation and titration of therapies, application of advanced monitoring technologies and extensive interpretation of multiple databases.  Independent Critical Care Time: 38 Minutes.   Sabrina Stein, M.D. [facility] Pulmonary/Critical Care Medicine 07/08/2023 7:39 AM   Please see Amion for pager number to reach on-call Pulmonary and Critical Care Team.

## 2023-07-09 ENCOUNTER — Inpatient Hospital Stay (HOSPITAL_COMMUNITY)

## 2023-07-09 DIAGNOSIS — A419 Sepsis, unspecified organism: Secondary | ICD-10-CM | POA: Diagnosis not present

## 2023-07-09 DIAGNOSIS — J9601 Acute respiratory failure with hypoxia: Secondary | ICD-10-CM | POA: Diagnosis not present

## 2023-07-09 DIAGNOSIS — J189 Pneumonia, unspecified organism: Secondary | ICD-10-CM | POA: Diagnosis not present

## 2023-07-09 DIAGNOSIS — D649 Anemia, unspecified: Secondary | ICD-10-CM | POA: Diagnosis not present

## 2023-07-09 LAB — URINALYSIS, ROUTINE W REFLEX MICROSCOPIC
Bilirubin Urine: NEGATIVE
Glucose, UA: NEGATIVE mg/dL
Ketones, ur: NEGATIVE mg/dL
Leukocytes,Ua: NEGATIVE
Nitrite: NEGATIVE
Protein, ur: 100 mg/dL — AB
RBC / HPF: >50 RBC/hpf (ref 0–5)
Specific Gravity, Urine: 1.013 (ref 1.005–1.030)
pH: 5 (ref 5.0–8.0)

## 2023-07-09 LAB — HEPATIC FUNCTION PANEL
ALT: 28 U/L (ref 0–44)
AST: 37 U/L (ref 15–41)
Albumin: 1.8 g/dL — ABNORMAL LOW (ref 3.5–5.0)
Alkaline Phosphatase: 306 U/L — ABNORMAL HIGH (ref 38–126)
Bilirubin, Direct: 1.1 mg/dL — ABNORMAL HIGH (ref 0.0–0.2)
Indirect Bilirubin: 1 mg/dL — ABNORMAL HIGH (ref 0.3–0.9)
Total Bilirubin: 2.1 mg/dL — ABNORMAL HIGH (ref 0.0–1.2)
Total Protein: 6.5 g/dL (ref 6.5–8.1)

## 2023-07-09 LAB — BASIC METABOLIC PANEL WITH GFR
Anion gap: 20 — ABNORMAL HIGH (ref 5–15)
BUN: 96 mg/dL — ABNORMAL HIGH (ref 8–23)
CO2: 19 mmol/L — ABNORMAL LOW (ref 22–32)
Calcium: 8.2 mg/dL — ABNORMAL LOW (ref 8.9–10.3)
Chloride: 99 mmol/L (ref 98–111)
Creatinine, Ser: 4.04 mg/dL — ABNORMAL HIGH (ref 0.44–1.00)
GFR, Estimated: 11 mL/min — ABNORMAL LOW (ref >60)
Glucose, Bld: 90 mg/dL (ref 70–99)
Potassium: 4.1 mmol/L (ref 3.5–5.1)
Sodium: 138 mmol/L (ref 135–145)

## 2023-07-09 LAB — CBC
HCT: 25 % — ABNORMAL LOW (ref 36.0–46.0)
Hemoglobin: 8.1 g/dL — ABNORMAL LOW (ref 12.0–15.0)
MCH: 28.9 pg (ref 26.0–34.0)
MCHC: 32.4 g/dL (ref 30.0–36.0)
MCV: 89.3 fL (ref 80.0–100.0)
Platelets: 301 10*3/uL (ref 150–400)
RBC: 2.8 MIL/uL — ABNORMAL LOW (ref 3.87–5.11)
RDW: 16.4 % — ABNORMAL HIGH (ref 11.5–15.5)
WBC: 12.1 10*3/uL — ABNORMAL HIGH (ref 4.0–10.5)
nRBC: 0.5 % — ABNORMAL HIGH (ref 0.0–0.2)

## 2023-07-09 LAB — GLUCOSE, CAPILLARY
Glucose-Capillary: 86 mg/dL (ref 70–99)
Glucose-Capillary: 103 mg/dL — ABNORMAL HIGH (ref 70–99)
Glucose-Capillary: 83 mg/dL (ref 70–99)
Glucose-Capillary: 100 mg/dL — ABNORMAL HIGH (ref 70–99)
Glucose-Capillary: 110 mg/dL — ABNORMAL HIGH (ref 70–99)
Glucose-Capillary: 99 mg/dL (ref 70–99)

## 2023-07-09 LAB — CREATININE, URINE, RANDOM: Creatinine, Urine: 30 mg/dL

## 2023-07-09 LAB — SODIUM, URINE, RANDOM: Sodium, Ur: 66 mmol/L

## 2023-07-09 LAB — PHOSPHORUS: Phosphorus: 8.1 mg/dL — ABNORMAL HIGH (ref 2.5–4.6)

## 2023-07-09 LAB — MAGNESIUM: Magnesium: 2.2 mg/dL (ref 1.7–2.4)

## 2023-07-09 MED ORDER — MEDIHONEY WOUND/BURN DRESSING EX PSTE
1.0000 | PASTE | Freq: Every day | CUTANEOUS | Status: DC
  Filled 2023-07-09: qty 44

## 2023-07-09 MED ORDER — FUROSEMIDE 10 MG/ML IJ SOLN
40.0000 mg | Freq: Once | INTRAMUSCULAR | Status: AC
  Administered 2023-07-09: 40 mg via INTRAVENOUS
  Filled 2023-07-09: qty 4

## 2023-07-09 MED ORDER — AMLODIPINE BESYLATE 10 MG PO TABS
10.0000 mg | ORAL_TABLET | Freq: Every day | ORAL | Status: DC
Start: 2023-07-09 — End: 2023-07-09

## 2023-07-09 MED ORDER — ACETAMINOPHEN 10 MG/ML IV SOLN
1000.0000 mg | Freq: Once | INTRAVENOUS | Status: AC
  Administered 2023-07-10: 1000 mg via INTRAVENOUS
  Filled 2023-07-09: qty 100

## 2023-07-09 MED ORDER — BISACODYL 10 MG RE SUPP
10.0000 mg | Freq: Once | RECTAL | Status: AC
  Administered 2023-07-09: 10 mg via RECTAL
  Filled 2023-07-09: qty 1

## 2023-07-09 NOTE — TOC Initial Note (Signed)
 Transition of Care [facility]) - Initial/Assessment Note    Patient Details  Name: Sabrina Stein MRN: [id number] Date of Birth: [birth year]  [facility]) CM/SW Contact:    Juliane Och, LCSW Phone Number: 07/09/2023, 3:58 PM  Clinical Narrative:                  3:58 PM CSW introduced self and role to patient at bedside. Patient's spouse, Darrow End, and son, were also present at bedside. Patient consented CSW to speak in front of spouse and son. CSW informed patient and guests of therapy recommendation of IP Rehab admission. Patient and guests were agreeable to recommendation. Patient stated she resides with spouse and that adult son and daughter resides out of town. Patient stated friends live in the area that could assist spouse with patient home support and/or transportation upon discharge. Patient declined HH/DME/SNF history but expressed interest in hospital bed upon discharge.  Expected Discharge Plan: IP Rehab Facility Barriers to Discharge: Continued Medical Work up   Patient Goals and CMS Choice Patient states their goals for this hospitalization and ongoing recovery are:: IP Rehab          Expected Discharge Plan and Services     Post Acute Care Choice: IP Rehab Living arrangements for the past 2 months: Single Family Home                                      Prior Living Arrangements/Services Living arrangements for the past 2 months: Single Family Home Lives with:: Spouse Patient language and need for interpreter reviewed:: Yes Do you feel safe going back to the place where you live?: Yes            Criminal Activity/Legal Involvement Pertinent to Current Situation/Hospitalization: No - Comment as needed  Activities of Daily Living   ADL Screening (condition at time of admission) Independently performs ADLs?: Yes (appropriate for developmental age) Is the patient deaf or have difficulty hearing?: No Does the patient have difficulty seeing, even  when wearing glasses/contacts?: No Does the patient have difficulty concentrating, remembering, or making decisions?: No  Permission Sought/Granted Permission sought to share information with : Family Supports Permission granted to share information with : Yes, Verbal Permission Granted  Share Information with NAME: Rheagan Nayak  Permission granted to share info w AGENCY: [facility]  Permission granted to share info w Relationship: Spouse  Permission granted to share info w Contact Information: [phone]  Emotional Assessment Appearance:: Appears stated age Attitude/Demeanor/Rapport: Engaged Affect (typically observed): Accepting, Adaptable, Appropriate, Calm, Stable Orientation: : Oriented to Self, Oriented to Place, Oriented to  Time, Oriented to Situation Alcohol / Substance Use: Not Applicable Psych Involvement: No (comment)  Admission diagnosis:  Iron deficiency anemia due to chronic blood loss [D50.0] Systolic congestive heart failure, unspecified HF chronicity (HCC) [I50.20] Acute anemia [D64.9] Acute respiratory failure with hypoxemia (HCC) [J96.01] Patient Active Problem List   Diagnosis Date Noted   Acute hypoxemic respiratory failure (HCC) 07/06/2023   Essential hypertension 07/06/2023   Acute respiratory failure with hypoxemia (HCC) 07/06/2023   Acute anemia 07/02/2023   AKI (acute kidney injury) (HCC) 07/02/2023   Colitis 07/02/2023   Left upper quadrant abdominal pain 05/15/2023   Hepatic steatosis 11/03/2022   Mixed stress and urge urinary incontinence 07/26/2021   GAD (generalized anxiety disorder) 07/26/2021   Pain of left heel 09/08/2020   Osteoarthritis of multiple joints  04/08/2020   Overactive bladder 09/24/2019   Controlled substance agreement signed 09/24/2019   Gastroesophageal reflux disease without esophagitis 09/24/2019   Prediabetes 09/24/2019   Restless leg 02/12/2019   Mild episode of recurrent major depressive disorder (HCC) 08/07/2018   Primary  insomnia 06/05/2016   Mixed hyperlipidemia 06/05/2016   Obesity (BMI 30-39.9) 12/06/2015   PCP:  Yevette Hem, FNP Pharmacy:   [facility] [phone] - [place], [place] - [address] [place] [address] Phone: [phone] Fax: [phone]  [facility] [phone] - [place], [place] - [address] [place] [address] Phone: [phone] Fax: [phone]     Social Drivers of Health (SDOH) Social History: SDOH Screenings   Food Insecurity: No Food Insecurity (07/02/2023)  Housing: Low Risk  (07/02/2023)  Transportation Needs: No Transportation Needs (07/02/2023)  Utilities: Not At Risk (07/02/2023)  Alcohol Screen: Low Risk  (05/15/2023)  Depression (PHQ2-9): Medium Risk (11/03/2022)  Financial Resource Strain: Low Risk  (05/15/2023)  Physical Activity: Sufficiently Active (05/15/2023)  Social Connections: Moderately Integrated (07/02/2023)  Stress: No Stress Concern Present (05/15/2023)  Tobacco Use: Medium Risk (07/02/2023)   SDOH Interventions:     Readmission Risk Interventions    07/02/2023    6:36 PM  Readmission Risk Prevention Plan  Transportation Screening Complete  PCP or Specialist Appt within 5-7 Days Complete  Home Care Screening Complete  Medication Review (RN CM) Complete

## 2023-07-09 NOTE — Progress Notes (Signed)
 Patient placed on heated high flow nasal cannula due to oxygen sats of 87% on 15L [place].  Tolerating well at this time.  Will continue to monitor.

## 2023-07-09 NOTE — Progress Notes (Signed)
 During bedside swallow eval, pt immediately vomited bile, green fluids after taking 2 sips of water. Dr. Washington Hacker notified. Zofran given IV for nausea.

## 2023-07-09 NOTE — Progress Notes (Signed)
 NAME:  Makyra Corprew, MRN:  [id number], DOB:  02-05-1951, LOS: 7 ADMISSION DATE:  07/02/2023, CONSULTATION DATE:  4/11 REFERRING MD:  Maud Deed , CHIEF COMPLAINT:  respiratory failure   History of Present Illness:  MS. Strieter is a 73 y/o woman with a history of GERD, Depression/anxiety, former tobacco use who presented [facility] ED w/ hypoxic respiratory failure.  Patient underwent colonoscopy on 3/28 showing colitis. Started empirically on cipro/flagyl. Patient having persistent weakness, N/V, and abd pain, and now with sob.  Denies bloody stools or fever.  Came to [facility] on 4/7 for further eval. on arrival patient hypothermic 94.5 F with soft BP.  WBC 15.8.  Cultures obtained, given IV fluids, started on broad-spectrum antibiotics.  Patient also with hypoxia requiring nasal cannula.  CXR concerning for multifocal pneumonia.  Creat elevated 3.09 and elevated LFTs.  Hemoglobin down to 6 and Hemoccult negative.  Transfuse PRBC.  Started on Protonix.  GI consulted.  Also with elevated BNP and troponins.  Cardiology consulted suspect demand ischemia.  Echo showing EF 55 to 70%; grade 1 diastolic.  CT chest, abdomen, pelvis showing multifocal pneumonia.  Patient admitted to floors.  Throughout stay patient with increasing O2 requirements.  On 4/11 now on heated high flow 40L, 80%.  Started on IV steroids and given Lasix.  On Unasyn/doxy.  Patient looking more tired and decision made to proceed with intubation.  PCCM consulted for transfer, will admit to [facility].    Pertinent  Medical History  GERD HLD Depression  Significant Hospital Events: Including procedures, antibiotic start and stop dates in addition to other pertinent events   4/7 admitted to [facility] 4/11 worsening hypoxia requiring intubation; transferred to [facility]; PCCM consulted 4/12 Improved FIO2 and pressor requirement 4/13 Extubated  Interim History / Subjective:   Extubated Overnight received PRBC x 1 Had nausea, given compazine. KUB  ileus Reports flatuance Poor UOP despite IVF Objective   Blood pressure (!) 140/67, pulse 76, temperature 98 F (36.7 C), temperature source Oral, resp. rate 14, height 5\' 2"  (1.575 m), weight 80.5 kg, SpO2 92%.    Vent Mode: PRVC FiO2 (%):  [40 %] 40 % Set Rate:  [18 bmp] 18 bmp Vt Set:  [400 mL] 400 mL PEEP:  [5 cmH20-8 cmH20] 5 cmH20 Plateau Pressure:  [21 cmH20] 21 cmH20   Intake/Output Summary (Last 24 hours) at 07/09/2023 0732 Last data filed at 07/09/2023 0500 Gross per 24 hour  Intake 5282.26 ml  Output 495 ml  Net 4787.26 ml   Filed Weights   07/06/23 2215 07/07/23 0407 07/08/23 0316  Weight: 80.3 kg 80.3 kg 80.5 kg   Physical Exam: General: Elderly-appearing, no acute distress HENT: [place], AT, OP clear, MMM Eyes: EOMI, no scleral icterus Respiratory: Clear to auscultation bilaterally.  No crackles, wheezing or rales Cardiovascular: RRR, -M/R/G, no JVD GI: BS+, firm but soft, nontender Extremities:-Edema,-tenderness Neuro: AAO x4, CNII-XII grossly intact GU: Foley in place  Imaging, labs and test in EMR in the last 24 hours reviewed independently by me. Pertinent findings below:  4/13 BUN/Cr 64/3.22, worsening WBC 12.5 improved Hg 6.9  4/14 Not collected Resolved Hospital Problem list     Assessment & Plan:   Acute respiratory failure with hypoxia Multifocal pneumonia: Possible aspiration. Strep and legionella neg. MRSA neg, RVP neg Plan: -Extubated -Complete doxy x 7 days -Plan for Zosyn x 5 days (end 4/15) -F/u tracheal aspirate -BID DUonebs -IV steroids. Plan to titrate in the next 24-48 hours  Acute anemia -s/p 2  units PRBC 4/7; fecal occult negative -S/p 1 unit PRBC 4/13 -hx of diclofenac use Plan: -cont PPI BID -F/u post-transfusion H/H -will likely need GI consult if persistent anemia. Once hypoxemia improves, will re-engage consult team  Septic shock +/- sedation-related hypotension - resolved Plan: -S/p IVF resuscitation including 1L  bolus 4/12 -Off levophed for map goal >65 -Continue maintenance IVF  AKI - initially 3.09 with improvement, worsening. Poor UOP Plan: -IVF resuscitation -Trend BMP / urinary output -Replace electrolytes as indicated -Avoid nephrotoxic agents, ensure adequate renal perfusion  Colitis N/V/abdominal pain Ileus -seen on colonoscopy on 3/28 -treated with cipro/flagyl outpt on 4/4 then on unasyn 4/7 on admission Plan: -zosyn as above -Bowel regimen -PRN antiemetics  HTN HLD Plan: -Start amlodipine 10 mg daily -statin  Elevated LFTs Plan: -improving -cont to trend  Anxiety/depression Plan: -cont effexor -hold xanax   Best Practice (right click and "Reselect all SmartList Selections" daily)   Diet/type: NPO DVT prophylaxis SCD Pressure ulcer(s): N/A GI prophylaxis: PPI Lines: N/A Foley:  Yes, and it is still needed Code Status:  full code Last date of multidisciplinary goals of care discussion [4/12] Confirmed code status with husband and daughter  Updated patient and daughter at bedside 4/14  Critical care time: N/A minutes   Care Time: 59 min  Genetta Kenning, M.D. [facility] Pulmonary/Critical Care Medicine 07/09/2023 7:34 AM   See Tilford Foley for personal pager For hours between 7 PM to 7 AM, please call Elink for urgent questions

## 2023-07-09 NOTE — Evaluation (Signed)
 Physical Therapy Evaluation Patient Details Name: Sabrina Stein MRN: [id number] DOB: 11/29/1950 Today's Date: 07/09/2023  History of Present Illness  Patient is a 73 y/o female admitted to [facility] 07/02/23 with N/V, weakness and SOB with recent colonoscopy on 06/22/23.  Chest CT positive for multifocal pneumonia, and transferred to [facility] ICU after intubation on 07/06/23.  She required pressors and developed AKI with decreased UOP.  Patient extubated 07/08/23.  PMH positive for anxiety, arthritis, depression, GERD and HLD.  Clinical Impression  Patient presents with decreased mobility due to limited activity tolerance, decreased sitting balance, decreased strength x 4 extremities, and decreased cognition.  She was previously independent at home completing IADL's and active playing pickleball.  She needed +2 A for attempts to move up in bed along EOB in sitting and developed N&V after mobility.  She will benefit from skilled PT in the acute setting and from post-acute inpatient rehab (>3 hours/day) prior to d/c home with family support.       If plan is discharge home, recommend the following: Assistance with cooking/housework;Assist for transportation;A lot of help with bathing/dressing/bathroom;A lot of help with walking and/or transfers;Help with stairs or ramp for entrance   Can travel by private vehicle        Equipment Recommendations Other (comment) (TBA)  Recommendations for Other Services       Functional Status Assessment Patient has had a recent decline in their functional status and demonstrates the ability to make significant improvements in function in a reasonable and predictable amount of time.     Precautions / Restrictions Precautions Precautions: Fall Precaution/Restrictions Comments: MAP > 65      Mobility  Bed Mobility Overal bed mobility: Needs Assistance Bed Mobility: Sit to Supine, Sidelying to Sit   Sidelying to sit: HOB elevated, Used rails, Mod assist   Sit to  supine: +2 for safety/equipment, Mod assist   General bed mobility comments: assist to turn and scoot hips, lift trunk and guided legs off EOB, to supine son assisting for positioning, assisted legs into bed    Transfers Overall transfer level: Needs assistance   Transfers: Bed to chair/wheelchair/BSC       Squat pivot transfers: Mod assist, +2 physical assistance     General transfer comment: squat pivot along edge of bed to get closer to [facility] with son assisting with direction for moving bed pad as pt lifted to partial squat position x 3 reps    Ambulation/Gait               General Gait Details: unable  Stairs            Wheelchair Mobility     Tilt Bed    Modified Rankin (Stroke Patients Only)       Balance Overall balance assessment: Needs assistance Sitting-balance support: Feet supported Sitting balance-Leahy Scale: Poor Sitting balance - Comments: min A throughout for balance at EOB       Standing balance comment: unable to stand                             Pertinent Vitals/Pain Pain Assessment Pain Assessment: 0-10 Pain Score: 8  Pain Location: Generalized, bottom worse than everything else Pain Descriptors / Indicators: Sore Pain Intervention(s): Monitored during session, Repositioned    Home Living Family/patient expects to be discharged to:: Private residence Living Arrangements: Spouse/significant other Available Help at Discharge: Family Type of Home: House Home Access: Stairs to enter  Entrance Stairs-Number of Steps: 1 Alternate Level Stairs-Number of Steps: flight Home Layout: Two level;Bed/bath upstairs Home Equipment: None      Prior Function Prior Level of Function : Independent/Modified Independent                     Extremity/Trunk Assessment   Upper Extremity Assessment Upper Extremity Assessment: RUE deficits/detail;LUE deficits/detail RUE Deficits / Details: AAROM WFL, strength shoulder  flexion 2-/5, elbow flex 4-/5, extension 3+/5 RUE Sensation: WNL LUE Deficits / Details: AAROM WFL, strength shoulder flexion 3/5, elbow flex 4/5, extension 4-/5 LUE Sensation: WNL    Lower Extremity Assessment Lower Extremity Assessment: RLE deficits/detail;LLE deficits/detail RLE Deficits / Details: AAROM limited with nausea (did not flex to compress abdomen in supine); strength hip flexion 2-/5, knee extension 2+/5, ankle DF 3+/5 LLE Deficits / Details: AAROM limited with nausea (did not flex to compress abdomen in supine); strength hip flexion 2/5, knee extension 3-/5, ankle DF 4/5 LLE Sensation: WNL    Cervical / Trunk Assessment Cervical / Trunk Assessment: Other exceptions Cervical / Trunk Exceptions: flexed in sitting with core weakness  Communication   Communication Communication: No apparent difficulties    Cognition Arousal: Alert Behavior During Therapy: Anxious   PT - Cognitive impairments: Problem solving, Attention                       PT - Cognition Comments: slow processing, sustained attention Following commands: Intact       Cueing       General Comments General comments (skin integrity, edema, etc.): son and spouse present and supportive; BP maintained with MAP in 90's throughout; +N&V after back to supine with RN aware; on HFNC at 13LPM throughout session    Exercises     Assessment/Plan    PT Assessment Patient needs continued PT services  PT Problem List Decreased strength;Decreased mobility;Decreased activity tolerance;Decreased balance;Decreased knowledge of use of DME;Pain;Cardiopulmonary status limiting activity       PT Treatment Interventions DME instruction;Therapeutic exercise;Gait training;Balance training;Functional mobility training;Cognitive remediation;Therapeutic activities;Patient/family education    PT Goals (Current goals can be found in the Care Plan section)  Acute Rehab PT Goals Patient Stated Goal: to get  stronger, go home PT Goal Formulation: With patient/family Time For Goal Achievement: 07/23/23 Potential to Achieve Goals: Good    Frequency Min 2X/week     Co-evaluation               AM-PAC PT "6 Clicks" Mobility  Outcome Measure Help needed turning from your back to your side while in a flat bed without using bedrails?: A Lot Help needed moving from lying on your back to sitting on the side of a flat bed without using bedrails?: Total Help needed moving to and from a bed to a chair (including a wheelchair)?: Total Help needed standing up from a chair using your arms (e.g., wheelchair or bedside chair)?: Total Help needed to walk in hospital room?: Total Help needed climbing 3-5 steps with a railing? : Total 6 Click Score: 7    End of Session Equipment Utilized During Treatment: Gait belt;Oxygen Activity Tolerance: Treatment limited secondary to medical complications (Comment) Patient left: in bed;with call bell/phone within reach;with family/visitor present Nurse Communication: Other (comment) (pt with N&V after return to supine) PT Visit Diagnosis: Other abnormalities of gait and mobility (R26.89);Muscle weakness (generalized) (M62.81)    Time: 4098-1191 PT Time Calculation (min) (ACUTE ONLY): 26 min   Charges:  PT Evaluation $PT Eval High Complexity: 1 High PT Treatments $Therapeutic Activity: 8-22 mins PT General Charges $$ ACUTE PT VISIT: 1 Visit         Abigail Hoff, PT Acute Rehabilitation Services Office:[phone] 07/09/2023   Marley Simmers 07/09/2023, 2:18 PM

## 2023-07-09 NOTE — Consult Note (Addendum)
 WOC Nurse Consult Note: Reason for Consult: ? Stage 3 pressure injury sacrum  Wound type:  full thickness wound ? Etiology (appears to have healed scar above this area)  Pressure Injury POA: NA  Measurement: see nursing flowsheet  Wound bed: 50% yellow 50% pink  Drainage (amount, consistency, odor) see nursing flowsheet  Periwound: mild erythema  Dressing procedure/placement/frequency:  Cleanse sacral wound with Vashe wound cleanser Timm Foot [phone]), do not rinse and allow to air dry.  Cut a strip of silver hydrofiber Timm Foot [phone]) and using a Q tip applicator insert into wound bed daily leaving a significant portion hanging out of wound for easy removal.  Cover with dry gauze and silicone foam or ABD pad whichever is preferred.   POC discussed with bedside nurse.  WOC team will not follow.  Re-consult if further needs arise.   Thank you,    Ronni Colace MSN, RN-BC, [employer] [phone]

## 2023-07-09 NOTE — Progress Notes (Signed)
 PT Cancellation Note  Patient Details Name: Anett Ranker MRN: [id number] DOB: March 04, 1951   Cancelled Treatment:    Reason Eval/Treat Not Completed: Medical issues which prohibited therapy; patient with N&V this am and now getting nausea meds.  Will follow up later today.   Marley Simmers 07/09/2023, 11:03 AM Abigail Hoff, PT Acute Rehabilitation Services [phone] 07/09/2023

## 2023-07-09 NOTE — Progress Notes (Signed)
 Pt c/o increasing shortness of breath. O2 sats 85% on 15L HFNC. RT at bedside giving breathing treatment (see MAR). Pt also c/o of tightness in belly. Abdomen feels soft to palpation. Bowel sounds hypoactive. Dr. Washington Hacker paged. Awaiting response. Pt alert and oriented x 4. HR 78, RR 18, BP 140/63.

## 2023-07-09 NOTE — Progress Notes (Signed)
 eLink Physician-Brief Progress Note Patient Name: Lind Ausley DOB: February 23, 1951 MRN: [id number]   Date of Service  07/09/2023  HPI/Events of Note  Increasing O2 requirement now on HHFNC 50 L 85%, seen awake not in acute distress but complaining of 8/10 back pain  CXR with worsening infiltrates KUB with non obstructive bowel gas pattern  There was a concern that she may have aspirated during speech evaluation Crackles heard on bedside exam Fluid positive 5 liters since admission Creatinine increasing at 4 despite fluids  eICU Interventions  IVF discontinued earlier in the day Already on piperacillin tazobactam Will give a trial of furosemide 40 mg IV, may need more Allergy to codeine, ordered a one time dose of acetaminophen 1 g IV as she remains MPO Discussed radiographic findings and plans with daughter who was at bedside Discussed with bedside RT as well     Intervention Category Intermediate Interventions: Communication with other healthcare providers and/or family;Diagnostic test evaluation  Turner Gains 07/09/2023, 11:35 PM

## 2023-07-09 NOTE — Progress Notes (Signed)

## 2023-07-09 NOTE — Progress Notes (Signed)
 PCCM Progress Note  Called to bedside for worsening hypoxemia despite 15L O2. Will transition to [facility]. Could be considered for BiPAP however had nausea today.  Patient complained of difficulty taking a breath and worsening abdominal distension. Also continued to have nausea all day.  Discontinued IVF for possible development of pulmonary edema in setting of AKI  STAT CXR and KUB ordered.  Signed out for Elink to follow-up

## 2023-07-09 NOTE — Progress Notes (Signed)
 Nutrition Follow-up  DOCUMENTATION CODES:  Severe malnutrition in context of acute illness/injury  INTERVENTION:  Recommend initiation of TPN as pt has been without adequate nutrition 7 days this admission and weeks prior to admission. Pt meets criteria for malnutrition Management per pharmacy Once nutrition initiated, pt will be at risk for refeeding. Monitor magnesium and phosphorus daily x 3 days, MD to replete as needed, as pt is at risk for refeeding syndrome given malnutrition and prolonged poor PO intake. Thiamine 100mg  x 5 days  NUTRITION DIAGNOSIS:  Severe Malnutrition related to acute illness as evidenced by energy intake < or equal to 50% for > or equal to 1 month, moderate muscle depletion. - new dx established 4/14  GOAL:  Patient will meet greater than or equal to 90% of their needs - not progressing, NPO, no source of nutrition  MONITOR:  Diet advancement, Labs, I & O's, Weight trends  REASON FOR ASSESSMENT:  Consult Enteral/tube feeding initiation and management  ASSESSMENT:  73 yo female admitted to [facility] 4/7 with multifocal PNA. Transferred to [facility] 4/11 with hypoxic respiratory failure requiring intubation. PMH includes HLD, depression, GERD, anxiety, arthritis.  4/7 - presented to [facility] ED 4/10 - BSE, regular diet/thin 4/11 - transfer to [facility] ICU, intubated 4/12 - TF initiated via OGT 4/13 - extubated  Pt extubated 4/13 but with ongoing dry heaves, nausea, and abdominal pain. Still requiring oxygen. Currently NPO as she is unable to tolerate a diet.    Pt resting in bed at the time of assessment. Son and husband at bedside assist with nutrition hx. Pt reports feeling poorly today. Had dry heaves several times during interview. States antiemetics have not been effective.  Discussed recent intake and nutrition hx. Pt reports that for over a month now (~6-8 weeks per chart review and pt report), she has had ongoing issues with abdominal pain and nausea.  Husband states that in February she went to doctor as she had abdominal pain which was constant but had ebbs and flows in intensity. Later she developed nausea and vomiting which she went to ED for in March and was diagnosed with colitis. Pt reports that for the last 2 months or so appetite has been very poor and she has had very little energy. At baseline, pt is active. States she was playing pickleball and doing silver sneakers classes. Pt states that she has been able to tolerate liquids the last few weeks, but has been consuming primarily gatorade, sprite, and juices. Initially was drinking ensure supplements but has not had them in awhile as they became too thick for her to tolerate.   On exam, pt does have muscle and fat depletions present and weight loss is noted continually over the last 2 months. No intake since admission (7 days). Pt meets criteria for malnutrition.  Discussed with team, recommend initiation of TPN as it is highly unlikely pt will be able to meet her needs orally and has already been without adequate nutrition 7 days this admission and weeks prior. Pt will need a central line placed.    Admit weight: 83.3 kg ? Accuracy 4/9 Weight (likely first measured): 79.6 kg  Current weight: 80.5 kg UBW (per pt): 189 lb  6.7% weight loss noted over the last 2 months (2/18-4/13) which is significant. Has likely lost weight this admission but initial weight was copied forward.   Intake/Output Summary (Last 24 hours) at 07/09/2023 1555 Last data filed at 07/09/2023 1400 Gross per 24 hour  Intake 2343.82 ml  Output 655 ml  Net 1688.82 ml  Net IO Since Admission: 6,676.65 mL [07/09/23 1555]  Drains/Lines: UOP x 24 hours  Nutritionally Relevant Medications: Scheduled Meds:  insulin aspart  0-9 Units Subcutaneous Q4H   methylPREDNISolone   40 mg Intravenous Q12H   pantoprazole (PROTONIX) IV  40 mg Intravenous Q12H   Continuous Infusions:  lactated ringers 75 mL/hr at  07/09/23 1200   piperacillin-tazobactam (ZOSYN)  IV Stopped (07/09/23 0703)   PRN Meds: ondansetron   Labs Reviewed: BUN 96, creatinine 4.04 Phosphorus 8.1 CBG ranges from 83-146 mg/dL over the last 24 hours HgbA1c 5.8% (4/7)  NUTRITION - FOCUSED PHYSICAL EXAM: Flowsheet Row Most Recent Value  Orbital Region Mild depletion  Upper Arm Region No depletion  Thoracic and Lumbar Region No depletion  Buccal Region No depletion  Temple Region Mild depletion  Clavicle Bone Region Mild depletion  Clavicle and Acromion Bone Region Mild depletion  Scapular Bone Region No depletion  Dorsal Hand No depletion  Patellar Region Moderate depletion  Anterior Thigh Region Moderate depletion  Posterior Calf Region Moderate depletion  Edema (RD Assessment) Mild  Hair Reviewed  Eyes Reviewed  Mouth Reviewed  Skin Reviewed  Nails Reviewed   Diet Order:   Diet Order             Diet NPO time specified  Diet effective now                   EDUCATION NEEDS:  Education needs have been addressed  Skin:  Skin Assessment: Skin Integrity Issues: Skin Integrity Issues:: Stage III Stage III: coccyx (1 x 1 cm)  Last BM:  4/8 type 2  Height:  Ht Readings from Last 1 Encounters:  07/06/23 5\' 2"  (1.575 m)    Weight:  Wt Readings from Last 1 Encounters:  07/08/23 80.5 kg    Ideal Body Weight:  50 kg  BMI:  Body mass index is 32.46 kg/m.  Estimated Nutritional Needs:  Kcal:  1550-1750 Protein:  75-90g/d Fluid:  1.6-1.8L/d    Edwena Graham, RD, LDN Registered Dietitian II Please reach out via secure chat

## 2023-07-09 NOTE — Consult Note (Signed)
 Reason for Consult:AKI/CKD stage III Referring Physician: Washington Hacker, MD  Sabrina Stein is an 73 y.o. female with a PMH significant for GERD, HLD, anxiety/depression, arthritis, CKD stage IIIa, and recent diagnosis of colitis who presented to [facility] ED via EMS on 07/02/23 with 3 day history of N/V, weakness, and SOB.  She has been having ongoing abdominal pain for several months.  In the ED, temp 94.5, Bp 113/40, HR 87, SpO2 93%.  Labs were notable for WBC 15.8, Hgb 6, Na 134, Co2 17, BUN 45, Cr 3.09, alb 2.2, AST 149, ALT 72,  alk phos 169, BNP 1141, D-dimer 5.1, tropon I 230.  CT of ches revealed multifocal pneumonia and was admitted to [facility] for further treatment.  She had progressively worsening respiratory failure and was transferred to [facility] ICU after intubation on 07/06/23.  She developed shock requiring levophed but improved from respiratory standpoint and was extubated  yesterday.  We were consulted due to recurrent AKI/CKD stage III.  Her initial presentation with AKI was improving and Scr reached a nadir of 1.23 on 07/06/23.  Unfortunately she developed shock and required pressors and her Scr has continued to climb since and her UOP has dropped off. The trend in Scr is seen below.    She was weaned off of levophed yesterday evening. She has also had recurrent need for blood transfusions during her hospitalization.  She has family members at the bedside.  She reports that she does not feel well and continues to have nausea, abdominal pain, and SOB.  Trend in Creatinine: Creatinine, Ser  Date/Time Value Ref Range Status  07/09/2023 08:49 AM 4.04 (H) 0.44 - 1.00 mg/dL Final  40/98/1191 47:82 AM 3.22 (H) 0.44 - 1.00 mg/dL Final  95/62/1308 65:78 AM 1.91 (H) 0.44 - 1.00 mg/dL Final  46/96/2952 84:13 AM 1.23 (H) 0.44 - 1.00 mg/dL Final  24/40/1027 25:36 AM 1.97 (H) 0.44 - 1.00 mg/dL Final  64/40/3474 25:95 AM 3.30 (H) 0.44 - 1.00 mg/dL Final  63/87/5643 32:95 PM 3.09 (H) 0.44 - 1.00 mg/dL Final  18/84/1660  63:01 AM 1.07 (H) 0.44 - 1.00 mg/dL Final  60/12/9321 55:73 PM 1.44 (H) 0.57 - 1.00 mg/dL Final  22/04/5425 06:23 AM 1.47 (H) 0.57 - 1.00 mg/dL Final  76/28/3151 76:16 AM 1.22 (H) 0.57 - 1.00 mg/dL Final  07/37/1062 69:48 PM 1.13 (H) 0.57 - 1.00 mg/dL Final  54/62/7035 00:93 AM 0.91 0.57 - 1.00 mg/dL Final  81/82/9937 16:96 PM 0.74 0.57 - 1.00 mg/dL Final  78/93/8101 75:10 AM 0.97 0.57 - 1.00 mg/dL Final  25/85/2778 24:23 AM 0.89 0.57 - 1.00 mg/dL Final  53/61/4431 54:00 AM 0.88 0.57 - 1.00 mg/dL Final  86/76/1950 93:26 AM 0.89 0.57 - 1.00 mg/dL Final  71/24/5809 98:33 AM 0.90 0.57 - 1.00 mg/dL Final    PMH:   Past Medical History:  Diagnosis Date   Anxiety    Arthritis    Depression    GERD (gastroesophageal reflux disease)    Hyperlipidemia     PSH:   Past Surgical History:  Procedure Laterality Date   APPENDECTOMY     CATARACT EXTRACTION Bilateral    CESAREAN SECTION     x2   COLONOSCOPY N/A 06/22/2023   Procedure: COLONOSCOPY;  Surgeon: Vinetta Greening, DO;  Location: [facility]ITE;  Service: Endoscopy;  Laterality: N/A;  130PM, OK RM 1    Allergies:  Allergies  Allergen Reactions   Codeine Nausea And Vomiting    Medications:   Prior to Admission  medications   Medication Sig Start Date End Date Taking? Authorizing Provider  acetaminophen (TYLENOL) 500 MG tablet Take 1,000 mg by mouth every 6 (six) hours as needed for mild pain (pain score 1-3).   Yes [provider]  ALPRAZolam (XANAX) 0.25 MG tablet Take 1 tablet (0.25 mg total) by mouth at bedtime as needed for anxiety. 01/09/23  Yes Hawks, Christy A, FNP  Ascorbic Acid (VITAMIN C) 1000 MG tablet Take 1,000 mg by mouth daily.   Yes [provider]  atorvastatin (LIPITOR) 20 MG tablet TAKE 1 TABLET BY MOUTH EVERY DAY 01/17/23  Yes Hawks, Christy A, FNP  Calcium Carbonate-Vit D-Min (CALCIUM 1200 PO) Take 1 tablet by mouth daily.   Yes [provider]  Cholecalciferol (VITAMIN D3) 1.25  MG (50000 UT) CAPS Take 1 capsule by mouth daily.   Yes [provider]  ciprofloxacin (CIPRO) 500 MG tablet Take 1 tablet (500 mg total) by mouth 2 (two) times daily for 10 days. 06/29/23 07/09/23 Yes Carver, Hennie Duos, DO  diclofenac (VOLTAREN) 75 MG EC tablet Take 1 tablet (75 mg total) by mouth 2 (two) times daily. 01/09/23  Yes Hawks, Christy A, FNP  dicyclomine (BENTYL) 20 MG tablet Take 1 tablet (20 mg total) by mouth 2 (two) times daily. 06/27/23 06/26/24 Yes Carver, Hennie Duos, DO  mirabegron ER (MYRBETRIQ) 50 MG TB24 tablet Take 1 tablet (50 mg total) by mouth daily. 07/31/22  Yes Hawks, Christy A, FNP  omeprazole (PRILOSEC) 40 MG capsule Take 1 capsule (40 mg total) by mouth daily. 05/15/23  Yes [place] Lighter, Dois Davenport, NP  ondansetron (ZOFRAN) 4 MG tablet Take 1 tablet (4 mg total) by mouth every 8 (eight) hours as needed for nausea. Patient taking differently: Take 4 mg by mouth every 8 (eight) hours as needed for nausea or vomiting. 06/18/23  Yes Hawks, Christy A, FNP  tolterodine (DETROL LA) 4 MG 24 hr capsule Take 1 capsule (4 mg total) by mouth daily. 09/25/22  Yes Jerilee Field, MD  venlafaxine XR (EFFEXOR-XR) 75 MG 24 hr capsule Take 1 capsule (75 mg total) by mouth daily. 07/31/22  Yes Hawks, Christy A, FNP  zinc gluconate 50 MG tablet Take 50 mg by mouth daily.   Yes [provider]  polyethylene glycol-electrolytes (NULYTELY) 420 g solution Take 4,000 mLs by mouth once. 06/18/23   [provider]    Inpatient medications:  sodium chloride   Intravenous Once   Chlorhexidine Gluconate Cloth  6 each Topical Q0600   insulin aspart  0-9 Units Subcutaneous Q4H   ipratropium-albuterol  3 mL Nebulization BID   methylPREDNISolone (SOLU-MEDROL) injection  40 mg Intravenous Q12H   pantoprazole (PROTONIX) IV  40 mg Intravenous Q12H   sodium chloride flush  3 mL Intravenous Q12H    Discontinued Meds:   Medications Discontinued During This Encounter  Medication  Reason   pantoprazole (PROTONIX) injection 40 mg    ciprofloxacin (CIPRO) tablet 500 mg    metroNIDAZOLE (FLAGYL) tablet 500 mg    furosemide (LASIX) injection 40 mg    polyethylene glycol (MIRALAX / GLYCOLAX) packet 17 g    bisacodyl (DULCOLAX) suppository 10 mg    metroNIDAZOLE (FLAGYL) 500 MG tablet Patient Preference   ipratropium-albuterol (DUONEB) 0.5-2.5 (3) MG/3ML nebulizer solution 3 mL    fesoterodine (TOVIAZ) tablet 4 mg    sodium chloride flush (NS) 0.9 % injection 3 mL Duplicate   Ampicillin-Sulbactam (UNASYN) 3 g in sodium chloride 0.9 % 100 mL IVPB  hydrALAZINE (APRESOLINE) injection 10 mg    ALPRAZolam (XANAX) tablet 0.25 mg    mirabegron ER (MYRBETRIQ) tablet 50 mg    traZODone (DESYREL) tablet 50 mg    dextromethorphan-guaiFENesin (MUCINEX DM) 30-600 MG per 12 hr tablet 1 tablet    fentaNYL (SUBLIMAZE) injection 25 mcg    fentaNYL (SUBLIMAZE) injection 25-100 mcg    midazolam (VERSED) injection 1-2 mg    docusate (COLACE) 50 MG/5ML liquid 100 mg Duplicate   polyethylene glycol (MIRALAX / GLYCOLAX) packet 17 g    Ampicillin-Sulbactam (UNASYN) 3 g in sodium chloride 0.9 % 100 mL IVPB    midazolam (VERSED) 100 mg/100 mL (1 mg/mL) premix infusion    docusate sodium (COLACE) capsule 100 mg Entry Error   doxycycline (VIBRA-TABS) tablet 100 mg    famotidine (PEPCID) tablet 20 mg    polyethylene glycol (MIRALAX / GLYCOLAX) packet 17 g    docusate (COLACE) 50 MG/5ML liquid 100 mg    midazolam (VERSED) bolus via infusion 0-5 mg    methylPREDNISolone sodium succinate (SOLU-MEDROL) 125 mg/2 mL injection 60 mg    atorvastatin (LIPITOR) tablet 20 mg    acetaminophen (TYLENOL) tablet 650 mg    acetaminophen (TYLENOL) suppository 650 mg    ondansetron (ZOFRAN) tablet 4 mg    ondansetron (ZOFRAN) injection 4 mg    venlafaxine XR (EFFEXOR-XR) 24 hr capsule 75 mg    feeding supplement (VITAL HIGH PROTEIN) liquid 1,000 mL    feeding supplement (PROSource TF20) liquid 60 mL     feeding supplement (VITAL AF 1.2 CAL) liquid 1,000 mL    piperacillin-tazobactam (ZOSYN) IVPB 3.375 g    Oral care mouth rinse    propofol (DIPRIVAN) 1000 MG/100ML infusion    fentaNYL (SUBLIMAZE) injection 25 mcg    fentaNYL in NS (51mcg/ml) infusion-PREMIX    fentaNYL (SUBLIMAZE) bolus via infusion 25-100 mcg    midazolam (VERSED) injection 1-2 mg    dexmedetomidine (PRECEDEX) 400 MCG/100ML (4 mcg/mL) infusion    doxycycline (VIBRAMYCIN) 100 mg in sodium chloride 0.9 % 250 mL IVPB    lactated ringers bolus 500 mL    norepinephrine (LEVOPHED) 4mg  in (0.016 mg/mL) premix infusion    atorvastatin (LIPITOR) tablet 20 mg    venlafaxine (EFFEXOR) tablet 37.5 mg    thiamine (VITAMIN B1) tablet 100 mg    amLODipine (NORVASC) tablet 10 mg    polyethylene glycol (MIRALAX / GLYCOLAX) packet 17 g    docusate (COLACE) 50 MG/5ML liquid 100 mg    leptospermum manuka honey (MEDIHONEY) paste 1 Application     Social History:  reports that she quit smoking about 11 years ago. Her smoking use included cigarettes. She started smoking about 54 years ago. She has a 43 pack-year smoking history. She has never used smokeless tobacco. She reports current alcohol use. She reports current drug use. Drug: Marijuana.  Family History:   Family History  Problem Relation Age of Onset   Cancer Father        lung   Healthy Sister    Healthy Daughter    Healthy Son     Pertinent items are noted in HPI. Weight change:   Intake/Output Summary (Last 24 hours) at 07/09/2023 1140 Last data filed at 07/09/2023 1000 Gross per 24 hour  Intake 2960.8 ml  Output 575 ml  Net 2385.8 ml   BP (!) 144/72   Pulse 77   Temp 97.7 F (36.5 C) (Oral)   Resp 14   Ht 5'  2" (1.575 m)   Wt 80.5 kg   SpO2 94%   BMI 32.46 kg/m  Vitals:   07/09/23 0800 07/09/23 0951 07/09/23 1000 07/09/23 1115  BP: (!) 148/71 (!) 142/93 (!) 144/72   Pulse: 78 79 77   Resp: 14 16 14    Temp:    97.7 F (36.5 C)   TempSrc:    Oral  SpO2: 95% 93% 94%   Weight:      Height:         General appearance: cooperative, fatigued, and no distress Head: Normocephalic, without obvious abnormality, atraumatic Eyes: negative findings: lids and lashes normal, conjunctivae and sclerae normal, and corneas clear Resp: clear to auscultation bilaterally Cardio: regular rate and rhythm, S1, S2 normal, no murmur, click, rub or gallop GI: soft, non-tender; bowel sounds normal; no masses,  no organomegaly Extremities: extremities normal, atraumatic, no cyanosis or edema  Labs: Basic Metabolic Panel: Recent Labs  Lab 07/02/23 1333 07/03/23 0508 07/04/23 0500 07/06/23 0504 07/06/23 2341 07/07/23 0357 07/07/23 1247 07/07/23 1752 07/08/23 0816 07/08/23 1838 07/09/23 0849  NA 134* 134* 136 136 137 137  --   --  135  --  138  K 4.0 3.5 3.6 3.5 3.9 4.6  --   --  4.3  --  4.1  CL 99 99 101 103  --  98  --   --  97*  --  99  CO2 17* 19* 22 22  --  24  --   --  21*  --  19*  GLUCOSE 152* 122* 132* 118*  --  183*  --   --  134*  --  90  BUN 45* 53* 43* 30*  --  37*  --   --  64*  --  96*  CREATININE 3.09* 3.30* 1.97* 1.23*  --  1.91*  --   --  3.22*  --  4.04*  ALBUMIN 2.2* 2.4* 2.1*  --   --  1.8*  --   --  1.7*  --  1.8*  CALCIUM 8.6* 8.3* 8.4* 8.4*  --  8.5*  --   --  8.2*  --  8.2*  PHOS  --   --   --   --   --   --  7.8* 7.9* 9.3* 7.7* 8.1*   Liver Function Tests: Recent Labs  Lab 07/07/23 0357 07/08/23 0816 07/09/23 0849  AST 55* 31 37  ALT 43 31 28  ALKPHOS 476* 297* 306*  BILITOT 2.4* 2.7* 2.1*  PROT 6.8 6.8 6.5  ALBUMIN 1.8* 1.7* 1.8*   No results for input(s): "LIPASE", "AMYLASE" in the last 168 hours. No results for input(s): "AMMONIA" in the last 168 hours. CBC: Recent Labs  Lab 07/02/23 1333 07/02/23 1554 07/07/23 0357 07/08/23 0816 07/08/23 1838 07/09/23 0849  WBC 15.8*   < > 17.5* 16.4* 12.5* 12.1*  NEUTROABS 13.4*  --  14.9*  --   --   --   HGB 6.0*   < > 8.3* 7.0* 6.9* 8.1*   HCT 18.8*   < > 26.1* 23.6* 22.3* 25.0*  MCV 94.0   < > 94.2 97.9 95.7 89.3  PLT 528*   < > 445* 465* 337 301   < > = values in this interval not displayed.   PT/INR: @LABRCNTIP (inr:5) Cardiac Enzymes: )No results for input(s): "CKTOTAL", "CKMB", "CKMBINDEX", "TROPONINI" in the last 168 hours. CBG: Recent Labs  Lab 07/08/23 1910 07/08/23 2317 07/09/23 0320 07/09/23 0730 07/09/23 1112  GLUCAP  103* 98 86 103* 83    Iron Studies:  Recent Labs  Lab 07/02/23 1554  IRON 54  TIBC 203*  FERRITIN 1,203*    Xrays/Other Studies: DG Abd 1 View Result Date: 07/08/2023 CLINICAL DATA:  Follow-up ileus EXAM: ABDOMEN - 1 VIEW COMPARISON:  None Available. FINDINGS: Administered contrast now lies within the colon. No obstructive changes are seen. No free air is noted. IMPRESSION: Administered contrast now lies within the colon. No obstructive changes are noted. Electronically Signed   By: Violeta Grey M.D.   On: 07/08/2023 23:59     Assessment/Plan:  AKI/CKD Stage III- likely recurrent ischemic ATN.  Initially presented to [facility] and Scr was up to 3.3 due to volume depletion due to N/V/D and poor po intake.  That improved to Scr of 1.23 on 07/06/23.  The second episode of ischemic ATN was in the setting of shock and need for pressors further complicated by ABLA.  She has received blood transfusions and she is now off of pressors.  She initially had good UOP but over the past 3 days has become oliguric.  Agree with ongoing IVF"s for now and follow.  No urgent indication for dialysis and will continue to follow along.  Will recheck UA.  No need for renal US  as CT of abdomen and pelvis did not show obstruction.   Avoid nephrotoxic medications including NSAIDs and iodinated intravenous contrast exposure unless the latter is absolutely indicated.   Preferred narcotic agents for pain control are hydromorphone, fentanyl, and methadone. Morphine should not be used.  Avoid Baclofen and avoid oral sodium  phosphate and magnesium citrate based laxatives / bowel preps.  Continue strict Input and Output monitoring.  Will monitor the patient closely with you and intervene or adjust therapy as indicated by changes in clinical status/labs  Acute hypoxic respiratory failure due to multifocal pneumonia - extubated 07/08/23.  To complete IV zosyn tomorrow per PCCM. Septic shock - currently off of pressors since last night. Colitis - GI following.   HTN - was low, now improved.  Continue to follow.  Elevated LFT's - likely due to shock liver.  Improving.    Drexel Gentles A Linnie Delgrande 07/09/2023, 11:40 AM

## 2023-07-09 NOTE — Plan of Care (Signed)
  Problem: Education: Goal: Knowledge of General Education information will improve Description: Including pain rating scale, medication(s)/side effects and non-pharmacologic comfort measures Outcome: Progressing   Problem: Health Behavior/Discharge Planning: Goal: Ability to manage health-related needs will improve Outcome: Progressing   Problem: Clinical Measurements: Goal: Ability to maintain clinical measurements within normal limits will improve Outcome: Progressing Goal: Will remain free from infection Outcome: Progressing Goal: Cardiovascular complication will be avoided Outcome: Progressing   Problem: Activity: Goal: Risk for activity intolerance will decrease Outcome: Progressing   Problem: Coping: Goal: Level of anxiety will decrease Outcome: Progressing   Problem: Elimination: Goal: Will not experience complications related to bowel motility Outcome: Progressing Goal: Will not experience complications related to urinary retention Outcome: Progressing   Problem: Pain Managment: Goal: General experience of comfort will improve and/or be controlled Outcome: Progressing   Problem: Safety: Goal: Ability to remain free from injury will improve Outcome: Progressing   Problem: Skin Integrity: Goal: Risk for impaired skin integrity will decrease Outcome: Progressing   Problem: Activity: Goal: Ability to tolerate increased activity will improve Outcome: Progressing   Problem: Respiratory: Goal: Ability to maintain a clear airway and adequate ventilation will improve Outcome: Progressing   Problem: Role Relationship: Goal: Method of communication will improve Outcome: Progressing   Problem: Education: Goal: Ability to describe self-care measures that may prevent or decrease complications (Diabetes Survival Skills Education) will improve Outcome: Progressing Goal: Individualized Educational Video(s) Outcome: Progressing   Problem: Coping: Goal: Ability to  adjust to condition or change in health will improve Outcome: Progressing   Problem: Fluid Volume: Goal: Ability to maintain a balanced intake and output will improve Outcome: Progressing   Problem: Health Behavior/Discharge Planning: Goal: Ability to identify and utilize available resources and services will improve Outcome: Progressing Goal: Ability to manage health-related needs will improve Outcome: Progressing   Problem: Metabolic: Goal: Ability to maintain appropriate glucose levels will improve Outcome: Progressing   Problem: Nutritional: Goal: Maintenance of adequate nutrition will improve Outcome: Progressing Goal: Progress toward achieving an optimal weight will improve Outcome: Progressing   Problem: Skin Integrity: Goal: Risk for impaired skin integrity will decrease Outcome: Progressing   Problem: Tissue Perfusion: Goal: Adequacy of tissue perfusion will improve Outcome: Progressing

## 2023-07-10 ENCOUNTER — Inpatient Hospital Stay (HOSPITAL_COMMUNITY)

## 2023-07-10 DIAGNOSIS — J189 Pneumonia, unspecified organism: Secondary | ICD-10-CM | POA: Diagnosis not present

## 2023-07-10 DIAGNOSIS — E43 Unspecified severe protein-calorie malnutrition: Secondary | ICD-10-CM | POA: Insufficient documentation

## 2023-07-10 DIAGNOSIS — J9601 Acute respiratory failure with hypoxia: Secondary | ICD-10-CM | POA: Diagnosis not present

## 2023-07-10 DIAGNOSIS — D649 Anemia, unspecified: Secondary | ICD-10-CM | POA: Diagnosis not present

## 2023-07-10 DIAGNOSIS — N179 Acute kidney failure, unspecified: Secondary | ICD-10-CM | POA: Diagnosis not present

## 2023-07-10 LAB — POCT I-STAT 7, (LYTES, BLD GAS, ICA,H+H)
Acid-base deficit: 8 mmol/L — ABNORMAL HIGH (ref 0.0–2.0)
Acid-base deficit: 8 mmol/L — ABNORMAL HIGH (ref 0.0–2.0)
Bicarbonate: 17.8 mmol/L — ABNORMAL LOW (ref 20.0–28.0)
Bicarbonate: 20.2 mmol/L (ref 20.0–28.0)
Calcium, Ion: 1.09 mmol/L — ABNORMAL LOW (ref 1.15–1.40)
Calcium, Ion: 1.11 mmol/L — ABNORMAL LOW (ref 1.15–1.40)
HCT: 25 % — ABNORMAL LOW (ref 36.0–46.0)
HCT: 31 % — ABNORMAL LOW (ref 36.0–46.0)
Hemoglobin: 10.5 g/dL — ABNORMAL LOW (ref 12.0–15.0)
Hemoglobin: 8.5 g/dL — ABNORMAL LOW (ref 12.0–15.0)
O2 Saturation: 100 %
O2 Saturation: 98 %
Patient temperature: 97.7
Patient temperature: 98.8
Potassium: 4.5 mmol/L (ref 3.5–5.1)
Potassium: 4.6 mmol/L (ref 3.5–5.1)
Sodium: 136 mmol/L (ref 135–145)
Sodium: 137 mmol/L (ref 135–145)
TCO2: 19 mmol/L — ABNORMAL LOW (ref 22–32)
TCO2: 22 mmol/L (ref 22–32)
pCO2 arterial: 36.2 mmHg (ref 32–48)
pCO2 arterial: 54.4 mmHg — ABNORMAL HIGH (ref 32–48)
pH, Arterial: 7.178 — CL (ref 7.35–7.45)
pH, Arterial: 7.298 — ABNORMAL LOW (ref 7.35–7.45)
pO2, Arterial: 126 mmHg — ABNORMAL HIGH (ref 83–108)
pO2, Arterial: 269 mmHg — ABNORMAL HIGH (ref 83–108)

## 2023-07-10 LAB — BPAM RBC
Blood Product Expiration Date: 202505072359
ISSUE DATE / TIME: 202504140010
Unit Type and Rh: 202505072359
Unit Type and Rh: 5100

## 2023-07-10 LAB — BASIC METABOLIC PANEL WITH GFR
Anion gap: 24 — ABNORMAL HIGH (ref 5–15)
BUN: 121 mg/dL — ABNORMAL HIGH (ref 8–23)
CO2: 15 mmol/L — ABNORMAL LOW (ref 22–32)
Calcium: 8.4 mg/dL — ABNORMAL LOW (ref 8.9–10.3)
Chloride: 98 mmol/L (ref 98–111)
Creatinine, Ser: 4.6 mg/dL — ABNORMAL HIGH (ref 0.44–1.00)
GFR, Estimated: 10 mL/min — ABNORMAL LOW (ref >60)
Glucose, Bld: 103 mg/dL — ABNORMAL HIGH (ref 70–99)
Potassium: 4.5 mmol/L (ref 3.5–5.1)
Sodium: 137 mmol/L (ref 135–145)

## 2023-07-10 LAB — CBC
HCT: 25.6 % — ABNORMAL LOW (ref 36.0–46.0)
Hemoglobin: 8.3 g/dL — ABNORMAL LOW (ref 12.0–15.0)
MCH: 29 pg (ref 26.0–34.0)
MCHC: 32.4 g/dL (ref 30.0–36.0)
MCV: 89.5 fL (ref 80.0–100.0)
Platelets: 303 10*3/uL (ref 150–400)
RBC: 2.86 MIL/uL — ABNORMAL LOW (ref 3.87–5.11)
RDW: 16.4 % — ABNORMAL HIGH (ref 11.5–15.5)
WBC: 14.6 10*3/uL — ABNORMAL HIGH (ref 4.0–10.5)
nRBC: 0.3 % — ABNORMAL HIGH (ref 0.0–0.2)

## 2023-07-10 LAB — CULTURE, RESPIRATORY W GRAM STAIN

## 2023-07-10 LAB — GLUCOSE, CAPILLARY
Glucose-Capillary: 121 mg/dL — ABNORMAL HIGH (ref 70–99)
Glucose-Capillary: 108 mg/dL — ABNORMAL HIGH (ref 70–99)
Glucose-Capillary: 92 mg/dL (ref 70–99)
Glucose-Capillary: 141 mg/dL — ABNORMAL HIGH (ref 70–99)
Glucose-Capillary: 156 mg/dL — ABNORMAL HIGH (ref 70–99)
Glucose-Capillary: 131 mg/dL — ABNORMAL HIGH (ref 70–99)

## 2023-07-10 LAB — TYPE AND SCREEN
ABO/RH(D): O POS
Antibody Screen: NEGATIVE
Unit division: 0

## 2023-07-10 LAB — HEPATITIS B SURFACE ANTIGEN: Hepatitis B Surface Ag: NONREACTIVE

## 2023-07-10 LAB — PHOSPHORUS: Phosphorus: 9 mg/dL — ABNORMAL HIGH (ref 2.5–4.6)

## 2023-07-10 MED ORDER — LORAZEPAM 2 MG/ML IJ SOLN
1.0000 mg | Freq: Once | INTRAMUSCULAR | Status: AC | PRN
  Administered 2023-07-10: 1 mg via INTRAVENOUS
  Filled 2023-07-10: qty 1

## 2023-07-10 MED ORDER — ORAL CARE MOUTH RINSE
15.0000 mL | OROMUCOSAL | Status: DC
  Administered 2023-07-10 – 2023-07-14 (×37): 15 mL via OROMUCOSAL

## 2023-07-10 MED ORDER — ROCURONIUM BROMIDE 10 MG/ML (PF) SYRINGE
PREFILLED_SYRINGE | INTRAVENOUS | Status: AC
Start: 2023-07-10 — End: 2023-07-10
  Filled 2023-07-10: qty 10

## 2023-07-10 MED ORDER — HYDROMORPHONE HCL 1 MG/ML IJ SOLN
0.5000 mg | Freq: Once | INTRAMUSCULAR | Status: AC
  Administered 2023-07-10: 0.5 mg via INTRAVENOUS
  Filled 2023-07-10: qty 0.5

## 2023-07-10 MED ORDER — MIDAZOLAM HCL 2 MG/2ML IJ SOLN
2.0000 mg | Freq: Once | INTRAMUSCULAR | Status: AC
  Administered 2023-07-10: 2 mg via INTRAVENOUS

## 2023-07-10 MED ORDER — KETAMINE HCL 50 MG/5ML IJ SOSY
PREFILLED_SYRINGE | INTRAMUSCULAR | Status: AC
  Filled 2023-07-10: qty 5

## 2023-07-10 MED ORDER — MIDAZOLAM HCL 2 MG/2ML IJ SOLN
INTRAMUSCULAR | Status: AC
  Filled 2023-07-10: qty 2

## 2023-07-10 MED ORDER — FENTANYL CITRATE PF 50 MCG/ML IJ SOSY: 100.0000 ug | PREFILLED_SYRINGE | Freq: Once | INTRAMUSCULAR | Status: DC

## 2023-07-10 MED ORDER — ROCURONIUM BROMIDE 10 MG/ML (PF) SYRINGE
PREFILLED_SYRINGE | INTRAVENOUS | Status: AC
  Filled 2023-07-10: qty 10

## 2023-07-10 MED ORDER — FENTANYL CITRATE PF 50 MCG/ML IJ SOSY: 25.0000 ug | PREFILLED_SYRINGE | Freq: Once | INTRAMUSCULAR | Status: AC

## 2023-07-10 MED ORDER — ROCURONIUM BROMIDE 10 MG/ML (PF) SYRINGE
100.0000 mg | PREFILLED_SYRINGE | Freq: Once | INTRAVENOUS | Status: AC
  Administered 2023-07-10: 100 mg via INTRAVENOUS

## 2023-07-10 MED ORDER — LIDOCAINE 5 % EX PTCH
1.0000 | MEDICATED_PATCH | CUTANEOUS | Status: DC
  Administered 2023-07-10 – 2023-07-13 (×4): 1 via TRANSDERMAL
  Filled 2023-07-10 (×4): qty 1

## 2023-07-10 MED ORDER — ETOMIDATE 2 MG/ML IV SOLN: 20.0000 mg | Freq: Once | INTRAVENOUS | Status: AC

## 2023-07-10 MED ORDER — FENTANYL BOLUS VIA INFUSION
25.0000 ug | INTRAVENOUS | Status: DC | PRN
  Administered 2023-07-10: 100 ug via INTRAVENOUS
  Administered 2023-07-10: 25 ug via INTRAVENOUS
  Administered 2023-07-10: 50 ug via INTRAVENOUS
  Administered 2023-07-10: 25 ug via INTRAVENOUS
  Administered 2023-07-10 – 2023-07-12 (×3): 50 ug via INTRAVENOUS
  Administered 2023-07-14: 100 ug via INTRAVENOUS
  Administered 2023-07-14: 25 ug via INTRAVENOUS

## 2023-07-10 MED ORDER — FENTANYL CITRATE PF 50 MCG/ML IJ SOSY
PREFILLED_SYRINGE | INTRAMUSCULAR | Status: AC
  Filled 2023-07-10: qty 1

## 2023-07-10 MED ORDER — HEPARIN SODIUM (PORCINE) 5000 UNIT/ML IJ SOLN
5000.0000 [IU] | Freq: Three times a day (TID) | INTRAMUSCULAR | Status: DC
  Administered 2023-07-10: 5000 [IU] via SUBCUTANEOUS
  Filled 2023-07-10: qty 1

## 2023-07-10 MED ORDER — ETOMIDATE 2 MG/ML IV SOLN
INTRAVENOUS | Status: AC
Start: 2023-07-10 — End: 2023-07-10
  Administered 2023-07-10: 20 mg via INTRAVENOUS
  Filled 2023-07-10: qty 20

## 2023-07-10 MED ORDER — POLYETHYLENE GLYCOL 3350 17 G PO PACK
17.0000 g | PACK | Freq: Every day | ORAL | Status: DC
  Administered 2023-07-11 – 2023-07-12 (×2): 17 g
  Filled 2023-07-10 (×2): qty 1

## 2023-07-10 MED ORDER — NOREPINEPHRINE 4 MG/250ML-% IV SOLN
0.0000 ug/min | INTRAVENOUS | Status: DC
  Administered 2023-07-10: 2 ug/min via INTRAVENOUS
  Administered 2023-07-11: 14 ug/min via INTRAVENOUS
  Filled 2023-07-10 (×2): qty 250

## 2023-07-10 MED ORDER — INSULIN ASPART 100 UNIT/ML IJ SOLN: 0.0000 [IU] | Freq: Three times a day (TID) | INTRAMUSCULAR | Status: DC

## 2023-07-10 MED ORDER — DOCUSATE SODIUM 50 MG/5ML PO LIQD
100.0000 mg | Freq: Two times a day (BID) | ORAL | Status: DC
  Administered 2023-07-10 – 2023-07-11 (×3): 100 mg
  Filled 2023-07-10 (×3): qty 10

## 2023-07-10 MED ORDER — FENTANYL 2500MCG IN NS 250ML (10MCG/ML) PREMIX INFUSION
25.0000 ug/h | INTRAVENOUS | Status: DC
  Administered 2023-07-10: 25 ug/h via INTRAVENOUS
  Administered 2023-07-11 – 2023-07-12 (×2): 100 ug/h via INTRAVENOUS
  Administered 2023-07-14: 25 ug/h via INTRAVENOUS
  Filled 2023-07-10 (×4): qty 250

## 2023-07-10 MED ORDER — FENTANYL BOLUS VIA INFUSION
100.0000 ug | Freq: Once | INTRAVENOUS | Status: AC
  Administered 2023-07-10: 100 ug via INTRAVENOUS
  Filled 2023-07-10: qty 100

## 2023-07-10 MED ORDER — FUROSEMIDE 10 MG/ML IJ SOLN
40.0000 mg | Freq: Once | INTRAMUSCULAR | Status: AC
  Administered 2023-07-10: 40 mg via INTRAVENOUS
  Filled 2023-07-10: qty 4

## 2023-07-10 MED ORDER — CHLORHEXIDINE GLUCONATE CLOTH 2 % EX PADS
6.0000 | MEDICATED_PAD | Freq: Every day | CUTANEOUS | Status: DC
  Administered 2023-07-11: 6 via TOPICAL

## 2023-07-10 MED ORDER — INSULIN ASPART 100 UNIT/ML IJ SOLN: 0.0000 [IU] | Freq: Every day | INTRAMUSCULAR | Status: DC

## 2023-07-10 MED ORDER — HEPARIN SODIUM (PORCINE) 5000 UNIT/ML IJ SOLN
5000.0000 [IU] | Freq: Three times a day (TID) | INTRAMUSCULAR | Status: DC
  Administered 2023-07-10 – 2023-07-11 (×2): 5000 [IU] via SUBCUTANEOUS
  Filled 2023-07-10 (×2): qty 1

## 2023-07-10 MED ORDER — HYDRALAZINE HCL 20 MG/ML IJ SOLN
10.0000 mg | INTRAMUSCULAR | Status: DC | PRN
  Administered 2023-07-10: 10 mg via INTRAVENOUS
  Filled 2023-07-10: qty 1

## 2023-07-10 MED ORDER — FENTANYL CITRATE PF 50 MCG/ML IJ SOSY
100.0000 ug | PREFILLED_SYRINGE | Freq: Once | INTRAMUSCULAR | Status: AC
  Administered 2023-07-10: 50 ug via INTRAVENOUS

## 2023-07-10 MED ORDER — FENTANYL CITRATE PF 50 MCG/ML IJ SOSY
50.0000 ug | PREFILLED_SYRINGE | Freq: Once | INTRAMUSCULAR | Status: AC
  Administered 2023-07-10: 50 ug via INTRAVENOUS

## 2023-07-10 MED ORDER — FENTANYL CITRATE PF 50 MCG/ML IJ SOSY
PREFILLED_SYRINGE | INTRAMUSCULAR | Status: AC
  Administered 2023-07-10: 50 ug
  Filled 2023-07-10: qty 2

## 2023-07-10 MED ORDER — PROPOFOL 1000 MG/100ML IV EMUL
0.0000 ug/kg/min | INTRAVENOUS | Status: DC
Start: 2023-07-10 — End: 2023-07-14
  Administered 2023-07-10: 10 ug/kg/min via INTRAVENOUS
  Administered 2023-07-10 – 2023-07-11 (×4): 20 ug/kg/min via INTRAVENOUS
  Administered 2023-07-12: 30 ug/kg/min via INTRAVENOUS
  Administered 2023-07-12 – 2023-07-13 (×3): 20 ug/kg/min via INTRAVENOUS
  Filled 2023-07-10 (×9): qty 100

## 2023-07-10 MED ORDER — ORAL CARE MOUTH RINSE: 15.0000 mL | OROMUCOSAL | Status: DC | PRN

## 2023-07-10 MED ORDER — ETOMIDATE 2 MG/ML IV SOLN: 20.0000 mg | INTRAVENOUS | Status: AC

## 2023-07-10 NOTE — Progress Notes (Signed)
 eLink Physician-Brief Progress Note Patient Name: Sabrina Stein DOB: 07/31/1950 MRN: [id number]   Date of Service  07/10/2023  HPI/Events of Note  Reportedly stil with 10/10 pain that is now generalized  She is now asleep. KPad just applied about 30 minutes ago  eICU Interventions  Spoke with BSRN, no intervention needed at this time     Intervention Category Intermediate Interventions: Pain - evaluation and management  Turner Gains 07/10/2023, 3:35 AM

## 2023-07-10 NOTE — Procedures (Signed)
 Intubation Procedure Note  Sabrina Stein  [id number]  Oct 09, 1950  Date:07/10/23  Time:2:26 PM   Provider Performing:Tryton Bodi Genetta Kenning, MD    Procedure: Intubation (31500)  Indication(s) Respiratory Failure  Consent Risks of the procedure as well as the alternatives and risks of each were explained to the patient and/or caregiver.  Consent for the procedure was obtained and is signed in the bedside chart   Anesthesia Etomidate, Versed, Fentanyl, and Rocuronium   Time Out Verified patient identification, verified procedure, site/side was marked, verified correct patient position, special equipment/implants available, medications/allergies/relevant history reviewed, required imaging and test results available.   Sterile Technique Usual hand hygeine, masks, and gloves were used   Procedure Description Patient positioned in bed supine.  Sedation given as noted above.  Patient was intubated with endotracheal tube using Glidescope.  View was Grade 1 full glottis .  Number of attempts was 1.  Colorimetric CO2 detector was consistent with tracheal placement.   Complications/Tolerance Posterior abrasion peri-intubation with bleed. Bloody secretions suctioned per bronchoscopy note. No evidence of active bleeding. Glidescope inserted and re-evaluated and abrasion with no further active bleeding. Chest X-ray is ordered to verify placement.   EBL 10 cc   Specimen(s) None

## 2023-07-10 NOTE — Progress Notes (Signed)
 OT Cancellation Note  Patient Details Name: Sabrina Stein MRN: [id number] DOB: Aug 10, 1950   Cancelled Treatment:    Reason Eval/Treat Not Completed: Patient not medically ready Discussed with nursing. Hypoxemia overnight, now on HHFNC and not yet ready for OT eval. Will follow up at a later date.  Brittany Osier 07/10/2023, 10:44 AM

## 2023-07-10 NOTE — Progress Notes (Signed)
 eLink Physician-Brief Progress Note Patient Name: Sabrina Stein DOB: 1950/11/07 MRN: [id number]   Date of Service  07/10/2023  HPI/Events of Note  Notified that patient is still complaining of generalized pain reportedly 12/10 BSRN reports there may be a component of anxiety Seen asleep SBP 170 She does take Xanax at home  eICU Interventions  Ordered a trial of prn ativan 1 mg     Intervention Category Intermediate Interventions: Pain - evaluation and management Minor Interventions: Agitation / anxiety - evaluation and management  Turner Gains 07/10/2023, 5:26 AM

## 2023-07-10 NOTE — Progress Notes (Addendum)
 Patient ID: Sabrina Stein, female   DOB: September 02, 1950, 73 y.o.   MRN: [id number] S: She had worsening hypoxemia overnight.  Given IV lasix and had 600 mL UOP. O:BP (!) 165/67   Pulse 74   Temp 98.1 F (36.7 C) (Oral)   Resp 17   Ht 5\' 2"  (1.575 m)   Wt 83.5 kg   SpO2 92%   BMI 33.67 kg/m   Intake/Output Summary (Last 24 hours) at 07/10/2023 0858 Last data filed at 07/10/2023 0600 Gross per 24 hour  Intake 899.41 ml  Output 880 ml  Net 19.41 ml   Intake/Output: I/O last 3 completed shifts: In: 2535.4 [I.V.:1709.4; Blood:326; IV Piggyback:500] Out: 1105 [Urine:1105]  Intake/Output this shift:  No intake/output data recorded. Weight change:  Gen: ill-appearing, fatigued.  CVS: RRR Resp: bilateral crackles Abd: +BS, soft, NT/ND Ext: trace pretibial edema bilaterally  Recent Labs  Lab 07/04/23 0500 07/06/23 0504 07/06/23 2341 07/07/23 0357 07/07/23 1247 07/07/23 1752 07/08/23 0816 07/08/23 1838 07/09/23 0849 07/10/23 0228  NA 136 136 137 137  --   --  135  --  138 137  K 3.6 3.5 3.9 4.6  --   --  4.3  --  4.1 4.5  CL 101 103  --  98  --   --  97*  --  99 98  CO2 22 22  --  24  --   --  21*  --  19* 15*  GLUCOSE 132* 118*  --  183*  --   --  134*  --  90 103*  BUN 43* 30*  --  37*  --   --  64*  --  96* 121*  CREATININE 1.97* 1.23*  --  1.91*  --   --  3.22*  --  4.04* 4.60*  ALBUMIN 2.1*  --   --  1.8*  --   --  1.7*  --  1.8*  --   CALCIUM 8.4* 8.4*  --  8.5*  --   --  8.2*  --  8.2* 8.4*  PHOS  --   --   --   --  7.8* 7.9* 9.3* 7.7* 8.1* 9.0*  AST 44*  --   --  55*  --   --  31  --  37  --   ALT 47*  --   --  43  --   --  31  --  28  --    Liver Function Tests: Recent Labs  Lab 07/07/23 0357 07/08/23 0816 07/09/23 0849  AST 55* 31 37  ALT 43 31 28  ALKPHOS 476* 297* 306*  BILITOT 2.4* 2.7* 2.1*  PROT 6.8 6.8 6.5  ALBUMIN 1.8* 1.7* 1.8*   No results for input(s): "LIPASE", "AMYLASE" in the last 168 hours. No results for input(s): "AMMONIA" in the last 168  hours. CBC: Recent Labs  Lab 07/07/23 0357 07/08/23 0816 07/08/23 1838 07/09/23 0849 07/10/23 0228  WBC 17.5* 16.4* 12.5* 12.1* 14.6*  NEUTROABS 14.9*  --   --   --   --   HGB 8.3* 7.0* 6.9* 8.1* 8.3*  HCT 26.1* 23.6* 22.3* 25.0* 25.6*  MCV 94.2 97.9 95.7 89.3 89.5  PLT 445* 465* 337 301 303   Cardiac Enzymes: No results for input(s): "CKTOTAL", "CKMB", "CKMBINDEX", "TROPONINI" in the last 168 hours. CBG: Recent Labs  Lab 07/09/23 1458 07/09/23 1908 07/09/23 2321 07/10/23 0253 07/10/23 0710  GLUCAP 100* 110* 99 121* 108*    Iron Studies:  No results for input(s): "IRON", "TIBC", "TRANSFERRIN", "FERRITIN" in the last 72 hours. Studies/Results: DG Abd 1 View Result Date: 07/09/2023 CLINICAL DATA:  Abdominal distension, pain EXAM: ABDOMEN - 1 VIEW COMPARISON:  None Available. FINDINGS: Nonobstructive bowel gas pattern. No organomegaly or free air. Severe diffuse bilateral airspace disease noted. IMPRESSION: No evidence of bowel obstruction or free air. Electronically Signed   By: Janeece Mechanic M.D.   On: 07/09/2023 21:02   DG CHEST PORT 1 VIEW Result Date: 07/09/2023 CLINICAL DATA:  Hypoxia EXAM: PORTABLE CHEST 1 VIEW COMPARISON:  07/06/2023 FINDINGS: Severe diffuse bilateral airspace disease, worsening since prior study. Heart borderline in size. Mediastinal contours within normal limits. No visible effusions. No acute bony abnormality. IMPRESSION: Worsening severe diffuse bilateral airspace disease. Electronically Signed   By: Janeece Mechanic M.D.   On: 07/09/2023 21:02   DG Abd 1 View Result Date: 07/08/2023 CLINICAL DATA:  Follow-up ileus EXAM: ABDOMEN - 1 VIEW COMPARISON:  None Available. FINDINGS: Administered contrast now lies within the colon. No obstructive changes are seen. No free air is noted. IMPRESSION: Administered contrast now lies within the colon. No obstructive changes are noted. Electronically Signed   By: Violeta Grey M.D.   On: 07/08/2023 23:59    sodium chloride    Intravenous Once   Chlorhexidine Gluconate Cloth  6 each Topical Q0600   heparin injection (subcutaneous)  5,000 Units Subcutaneous Q8H   insulin aspart  0-9 Units Subcutaneous Q4H   ipratropium-albuterol  3 mL Nebulization BID   lidocaine  1 patch Transdermal Q24H   pantoprazole (PROTONIX) IV  40 mg Intravenous Q12H   sodium chloride flush  3 mL Intravenous Q12H    BMET    Component Value Date/Time   NA 137 07/10/2023 0228   NA 138 11/08/2022 1336   K 4.5 07/10/2023 0228   CL 98 07/10/2023 0228   CO2 15 (L) 07/10/2023 0228   GLUCOSE 103 (H) 07/10/2023 0228   BUN 121 (H) 07/10/2023 0228   BUN 23 11/08/2022 1336   CREATININE 4.60 (H) 07/10/2023 0228   CALCIUM 8.4 (L) 07/10/2023 0228   GFRNONAA 10 (L) 07/10/2023 0228   GFRAA 96 04/08/2020 1630   CBC    Component Value Date/Time   WBC 14.6 (H) 07/10/2023 0228   RBC 2.86 (L) 07/10/2023 0228   HGB 8.3 (L) 07/10/2023 0228   HGB 11.8 11/03/2022 0940   HCT 25.6 (L) 07/10/2023 0228   HCT 34.6 11/03/2022 0940   PLT 303 07/10/2023 0228   PLT 330 11/03/2022 0940   MCV 89.5 07/10/2023 0228   MCV 94 11/03/2022 0940   MCH 29.0 07/10/2023 0228   MCHC 32.4 07/10/2023 0228   RDW 16.4 (H) 07/10/2023 0228   RDW 12.9 11/03/2022 0940   LYMPHSABS 1.3 07/07/2023 0357   LYMPHSABS 1.4 11/03/2022 0940   MONOABS 0.7 07/07/2023 0357   EOSABS 0.0 07/07/2023 0357   EOSABS 0.1 11/03/2022 0940   BASOSABS 0.0 07/07/2023 0357   BASOSABS 0.1 11/03/2022 0940   Assessment/Plan:  AKI/CKD Stage III- likely recurrent ischemic ATN.  Initially presented to [facility] and Scr was up to 3.3 due to volume depletion due to N/V/D and poor po intake.  That improved to Scr of 1.23 on 07/06/23.  The second episode of ischemic ATN was in the setting of shock and need for pressors further complicated by ABLA.  She has received blood transfusions and she is now off of pressors.  She initially had good UOP but over the  past 3 days has become oliguric.  Agree with ongoing  IVF"s for now and follow.  No urgent indication for dialysis at this time, however if her respiratory/volume status worsen, she may need to start HD in the next 24-48 hours.  Will continue to follow along.  No need for renal US  as CT of abdomen and pelvis did not show obstruction.   Avoid nephrotoxic medications including NSAIDs and iodinated intravenous contrast exposure unless the latter is absolutely indicated.   Preferred narcotic agents for pain control are hydromorphone, fentanyl, and methadone. Morphine should not be used.  Avoid Baclofen and avoid oral sodium phosphate and magnesium citrate based laxatives / bowel preps.  Continue strict Input and Output monitoring.  Will monitor the patient closely with you and intervene or adjust therapy as indicated by changes in clinical status/labs  Acute hypoxic respiratory failure due to multifocal pneumonia - extubated 07/08/23.  To complete IV zosyn today per PCCM.  Agree with IV lasix and follow diuresis. Septic shock - currently off of pressors since last night. Colitis - GI following.   HTN - was low, now improved.  Continue to follow.  Elevated LFT's - likely due to shock liver.  Improving.    Discussed case with Dr. Washington Hacker.  Apparently her oxygenation is worsening and has not really responded to IV lasix.  Will plan to re-intubate per Dr. Washington Hacker and will start a trial of dialysis to help with volume status and hypoxia.   Benjamin Brands, MD [employer] [phone]

## 2023-07-10 NOTE — Progress Notes (Addendum)
 eLink Physician-Brief Progress Note Patient Name: Sabrina Stein DOB: Jul 09, 1950 MRN: [id number]   Date of Service  07/10/2023  HPI/Events of Note  73 y/o woman with a history of GERD, Depression/anxiety, former tobacco use who presented [facility] ED w/ hypoxic respiratory failure.   Last gas with pH 7.1 8/54/126    eICU Interventions  Although she is adequately oxygenating, her driving pressures are greater than 15, favor increasing PEEP to 15 and rate to 28.  Able to titrate down FiO2.  Repeat gas at midnight.   2200 -maintain current RASS goal, do not titrate sedation to vent dyssynchrony.  Add vasopressors as needed for relative hypotension..  NG tube okay to use  Intervention Category Intermediate Interventions: Respiratory distress - evaluation and management  Jeremy Ditullio 07/10/2023, 9:42 PM

## 2023-07-10 NOTE — Progress Notes (Signed)
 NAME:  Sabrina Stein, MRN:  [id number], DOB:  [birth year], LOS: 8 ADMISSION DATE:  07/02/2023, CONSULTATION DATE:  4/11 REFERRING MD:  Maud Deed , CHIEF COMPLAINT:  respiratory failure   History of Present Illness:  Sabrina Stein is a 73 y/o woman with a history of GERD, Depression/anxiety, former tobacco use who presented [facility] ED w/ hypoxic respiratory failure.  Patient underwent colonoscopy on 3/28 showing colitis. Started empirically on cipro/flagyl. Patient having persistent weakness, N/V, and abd pain, and now with sob.  Denies bloody stools or fever.  Came to [facility] on 4/7 for further eval. on arrival patient hypothermic 94.5 F with soft BP.  WBC 15.8.  Cultures obtained, given IV fluids, started on broad-spectrum antibiotics.  Patient also with hypoxia requiring nasal cannula.  CXR concerning for multifocal pneumonia.  Creat elevated 3.09 and elevated LFTs.  Hemoglobin down to 6 and Hemoccult negative.  Transfuse PRBC.  Started on Protonix.  GI consulted.  Also with elevated BNP and troponins.  Cardiology consulted suspect demand ischemia.  Echo showing EF 55 to 70%; grade 1 diastolic.  CT chest, abdomen, pelvis showing multifocal pneumonia.  Patient admitted to floors.  Throughout stay patient with increasing O2 requirements.  On 4/11 now on heated high flow 40L, 80%.  Started on IV steroids and given Lasix.  On Unasyn/doxy.  Patient looking more tired and decision made to proceed with intubation.  PCCM consulted for transfer, will admit to [facility].    Pertinent  Medical History  GERD HLD Depression  Significant Hospital Events: Including procedures, antibiotic start and stop dates in addition to other pertinent events   4/7 admitted to [facility] 4/11 worsening hypoxia requiring intubation; transferred to [facility]; PCCM consulted 4/12 Improved FIO2 and pressor requirement 4/13 Extubated 4/14 Worsening hypoxemia and renal failure overnight  Interim History / Subjective:   Worsening hypoxemia overnight.  On [facility]HFNC FIO2 85% Reports diffuse general back/abdominal pain  Objective   Blood pressure (!) 168/69, pulse 74, temperature 98.1 F (36.7 C), temperature source Oral, resp. rate 17, height 5\' 2"  (1.575 m), weight 83.5 kg, SpO2 94%.    FiO2 (%):  [84 %-85 %] 85 %   Intake/Output Summary (Last 24 hours) at 07/10/2023 0821 Last data filed at 07/10/2023 0600 Gross per 24 hour  Intake 899.41 ml  Output 880 ml  Net 19.41 ml   Filed Weights   07/07/23 0407 07/08/23 0316 07/10/23 0500  Weight: 80.3 kg 80.5 kg 83.5 kg   Physical Exam: General: Elderly-appearing, no acute distress HENT: [place], AT, HHFNC Eyes: EOMI, no scleral icterus Respiratory: Bilateral crackles Cardiovascular: RRR, -M/R/G, no JVD GI: BS+, soft, nontender Extremities:-Edema,-tenderness Neuro: AAO x4, CNII-XII grossly intact GU: Foley in place   Imaging, labs and test in EMR in the last 24 hours reviewed independently by me. Pertinent findings below:  4/15 WBC 14.6 Hg 8.3 stable Cr 121/4.60 CO2 15 Resolved Hospital Problem list   Septic shock +/- sedation-related hypotension  Assessment & Plan:   Acute respiratory failure with hypoxia Multifocal pneumonia: Possible aspiration. Strep and legionella neg. MRSA neg, RVP neg Plan: -Extubated -Complete doxy x 7 days -Plan for Zosyn x 5 days (end 4/15) -F/u tracheal aspirate -BID Duonebs -DC steroids  Acute anemia - stable for last 48 hours -s/p 2 units PRBC 4/7; fecal occult negative -S/p 1 unit PRBC 4/13 -hx of diclofenac use Plan: -cont PPI BID -will likely need GI consult if persistent anemia. Once hypoxemia improves, will re-engage consult team  AKI - worsening Plan: -Appreciate  Nephrology input. Agree with diuresis. May need dialysis -Diurese -Trend BMP / urinary output -Replace electrolytes as indicated -Avoid nephrotoxic agents, ensure adequate renal perfusion  Colitis N/V/abdominal pain Ileus -seen on colonoscopy on 3/28 -treated with  cipro/flagyl outpt on 4/4 then on unasyn 4/7 on admission Plan: -zosyn as above -Bowel regimen -PRN antiemetics  Back pain - IV dilaudid once - Lidoderm patch  HTN HLD Plan: -PRN hydralazine -statin  Elevated LFTs Plan: -improving -cont to trend  Anxiety/depression Plan: -cont effexor -hold xanax   Best Practice (right click and "Reselect all SmartList Selections" daily)   Diet/type: NPO DVT prophylaxis prophylactic heparin  Start 4/15 Pressure ulcer(s): N/A GI prophylaxis: PPI Lines: N/A Foley:  Yes, and it is still needed Code Status:  full code Last date of multidisciplinary goals of care discussion [4/12] Confirmed code status with husband and daughter  Updated daughter 4/15  Critical care time: 46 minutes    The patient is critically ill with multiple organ systems failure and requires high complexity decision making for assessment and support, frequent evaluation and titration of therapies, application of advanced monitoring technologies and extensive interpretation of multiple databases.  Independent Critical Care Time: 46 Minutes.   Sabrina Stein, M.D. [facility] Pulmonary/Critical Care Medicine 07/10/2023 8:22 AM   Please see Amion for pager number to reach on-call Pulmonary and Critical Care Team.

## 2023-07-10 NOTE — Progress Notes (Signed)
 Placed by Parke Boll, RN; air bolus audible over abdomen by Parke Boll, RN and Burke Carolus, RN

## 2023-07-10 NOTE — Progress Notes (Signed)
 eLink Physician-Brief Progress Note Patient Name: Sabrina Stein DOB: 04/09/1950 MRN: [id number]   Date of Service  07/10/2023  HPI/Events of Note  Still with back pain despite IV Tylenol K Pad suggested Unable to give NSAID due to nephrotoxicity Has reactions to codeine  eICU Interventions  Trial of K Pad ordered     Intervention Category Intermediate Interventions: Pain - evaluation and management  Turner Gains 07/10/2023, 12:55 AM

## 2023-07-10 NOTE — Progress Notes (Signed)
 Post intubation ABG results obtained on ventilator settings of VT: 400, RR: 22, FIO2: 100%, and PEEP: 12.  Results given to MD.  Increased RR to 25.  Will continue to monitor.     Latest Reference Range & Units 07/10/23 15:28  Sample type  ARTERIAL  pH, Arterial 7.35 - 7.45  7.178 (LL)  pCO2 arterial 32 - 48 mmHg 54.4 (H)  pO2, Arterial 83 - 108 mmHg 126 (H)  TCO2 22 - 32 mmol/L 22  Acid-base deficit 0.0 - 2.0 mmol/L 8.0 (H)  Bicarbonate 20.0 - 28.0 mmol/L 20.2  O2 Saturation % 98  Patient temperature  98.8 F  Collection site  RADIAL, ALLEN'S TEST ACCEPTABLE

## 2023-07-10 NOTE — Procedures (Signed)
 Central Venous Catheter Insertion Procedure Note  Sabrina Stein  [id number]  1950/05/30  Date:07/10/23  Time:2:52 PM   Provider Performing:Dellanira Dillow R Simuel Stebner   Procedure: Insertion of Non-tunneled Central Venous Catheter(36556)with US  guidance (72536)    Indication(s) Hemodialysis  Consent Unable to obtain consent due to emergent nature of procedure.  Anesthesia Topical only with 1% lidocaine   Timeout Verified patient identification, verified procedure, site/side was marked, verified correct patient position, special equipment/implants available, medications/allergies/relevant history reviewed, required imaging and test results available.  Sterile Technique Maximal sterile technique including full sterile barrier drape, hand hygiene, sterile gown, sterile gloves, mask, hair covering, sterile ultrasound probe cover (if used).  Procedure Description Area of catheter insertion was cleaned with chlorhexidine and draped in sterile fashion.   With real-time ultrasound guidance a HD catheter was placed into the right internal jugular vein.  Nonpulsatile blood flow and easy flushing noted in all ports.  The catheter was sutured in place and sterile dressing applied.  Complications/Tolerance None; patient tolerated the procedure well. Chest X-ray is ordered to verify placement for internal jugular or subclavian cannulation.  Chest x-ray is not ordered for femoral cannulation.  EBL Minimal  Specimen(s) None  Sabrina Stein Sabrina Waymire, PA-C

## 2023-07-11 ENCOUNTER — Inpatient Hospital Stay (HOSPITAL_COMMUNITY)

## 2023-07-11 DIAGNOSIS — J189 Pneumonia, unspecified organism: Secondary | ICD-10-CM | POA: Diagnosis not present

## 2023-07-11 DIAGNOSIS — N179 Acute kidney failure, unspecified: Secondary | ICD-10-CM | POA: Diagnosis not present

## 2023-07-11 DIAGNOSIS — J9601 Acute respiratory failure with hypoxia: Secondary | ICD-10-CM | POA: Diagnosis not present

## 2023-07-11 DIAGNOSIS — D649 Anemia, unspecified: Secondary | ICD-10-CM | POA: Diagnosis not present

## 2023-07-11 LAB — RENAL FUNCTION PANEL
Albumin: 1.7 g/dL — ABNORMAL LOW (ref 3.5–5.0)
Albumin: 1.7 g/dL — ABNORMAL LOW (ref 3.5–5.0)
Anion gap: 24 — ABNORMAL HIGH (ref 5–15)
Anion gap: 18 — ABNORMAL HIGH (ref 5–15)
BUN: 152 mg/dL — ABNORMAL HIGH (ref 8–23)
BUN: 77 mg/dL — ABNORMAL HIGH (ref 8–23)
CO2: 16 mmol/L — ABNORMAL LOW (ref 22–32)
CO2: 22 mmol/L (ref 22–32)
Calcium: 8.9 mg/dL (ref 8.9–10.3)
Calcium: 8.5 mg/dL — ABNORMAL LOW (ref 8.9–10.3)
Chloride: 99 mmol/L (ref 98–111)
Chloride: 96 mmol/L — ABNORMAL LOW (ref 98–111)
Creatinine, Ser: 5.48 mg/dL — ABNORMAL HIGH (ref 0.44–1.00)
Creatinine, Ser: 2.94 mg/dL — ABNORMAL HIGH (ref 0.44–1.00)
GFR, Estimated: 8 mL/min — ABNORMAL LOW (ref >60)
GFR, Estimated: 16 mL/min — ABNORMAL LOW (ref >60)
Glucose, Bld: 139 mg/dL — ABNORMAL HIGH (ref 70–99)
Glucose, Bld: 119 mg/dL — ABNORMAL HIGH (ref 70–99)
Phosphorus: 11.9 mg/dL — ABNORMAL HIGH (ref 2.5–4.6)
Phosphorus: 6 mg/dL — ABNORMAL HIGH (ref 2.5–4.6)
Potassium: 4.9 mmol/L (ref 3.5–5.1)
Potassium: 4.1 mmol/L (ref 3.5–5.1)
Sodium: 139 mmol/L (ref 135–145)
Sodium: 136 mmol/L (ref 135–145)

## 2023-07-11 LAB — BLOOD GAS, ARTERIAL
Acid-base deficit: 5.6 mmol/L — ABNORMAL HIGH (ref 0.0–2.0)
Bicarbonate: 19.3 mmol/L — ABNORMAL LOW (ref 20.0–28.0)
O2 Saturation: 98.6 %
Patient temperature: 36.4
pCO2 arterial: 34 mmHg (ref 32–48)
pH, Arterial: 7.36 (ref 7.35–7.45)
pO2, Arterial: 96 mmHg (ref 83–108)

## 2023-07-11 LAB — CBC
HCT: 24.2 % — ABNORMAL LOW (ref 36.0–46.0)
Hemoglobin: 8 g/dL — ABNORMAL LOW (ref 12.0–15.0)
MCH: 28.9 pg (ref 26.0–34.0)
MCHC: 33.1 g/dL (ref 30.0–36.0)
MCV: 87.4 fL (ref 80.0–100.0)
Platelets: 393 10*3/uL (ref 150–400)
RBC: 2.77 MIL/uL — ABNORMAL LOW (ref 3.87–5.11)
RDW: 16 % — ABNORMAL HIGH (ref 11.5–15.5)
WBC: 21.3 10*3/uL — ABNORMAL HIGH (ref 4.0–10.5)
nRBC: 0.6 % — ABNORMAL HIGH (ref 0.0–0.2)

## 2023-07-11 LAB — GLUCOSE, CAPILLARY
Glucose-Capillary: 110 mg/dL — ABNORMAL HIGH (ref 70–99)
Glucose-Capillary: 110 mg/dL — ABNORMAL HIGH (ref 70–99)
Glucose-Capillary: 118 mg/dL — ABNORMAL HIGH (ref 70–99)
Glucose-Capillary: 133 mg/dL — ABNORMAL HIGH (ref 70–99)
Glucose-Capillary: 134 mg/dL — ABNORMAL HIGH (ref 70–99)
Glucose-Capillary: 150 mg/dL — ABNORMAL HIGH (ref 70–99)

## 2023-07-11 LAB — LACTIC ACID, PLASMA
Lactic Acid, Venous: 1.6 mmol/L (ref 0.5–1.9)
Lactic Acid, Venous: 1.2 mmol/L (ref 0.5–1.9)

## 2023-07-11 LAB — HEPATIC FUNCTION PANEL
ALT: 20 U/L (ref 0–44)
AST: 27 U/L (ref 15–41)
Albumin: 1.8 g/dL — ABNORMAL LOW (ref 3.5–5.0)
Alkaline Phosphatase: 206 U/L — ABNORMAL HIGH (ref 38–126)
Bilirubin, Direct: 1 mg/dL — ABNORMAL HIGH (ref 0.0–0.2)
Indirect Bilirubin: 1.5 mg/dL — ABNORMAL HIGH (ref 0.3–0.9)
Total Bilirubin: 2.5 mg/dL — ABNORMAL HIGH (ref 0.0–1.2)
Total Protein: 6.3 g/dL — ABNORMAL LOW (ref 6.5–8.1)

## 2023-07-11 LAB — PHOSPHORUS
Phosphorus: 5.9 mg/dL — ABNORMAL HIGH (ref 2.5–4.6)
Phosphorus: 6 mg/dL — ABNORMAL HIGH (ref 2.5–4.6)

## 2023-07-11 LAB — MAGNESIUM
Magnesium: 2 mg/dL (ref 1.7–2.4)
Magnesium: 2.3 mg/dL (ref 1.7–2.4)

## 2023-07-11 LAB — HEPATITIS B SURFACE ANTIBODY, QUANTITATIVE: Hep B S AB Quant (Post): <3.5 m[IU]/mL — ABNORMAL LOW

## 2023-07-11 LAB — TRIGLYCERIDES: Triglycerides: 288 mg/dL — ABNORMAL HIGH (ref <150)

## 2023-07-11 MED ORDER — INSULIN ASPART 100 UNIT/ML IJ SOLN
0.0000 [IU] | INTRAMUSCULAR | Status: DC
  Administered 2023-07-12 – 2023-07-13 (×5): 1 [IU] via SUBCUTANEOUS

## 2023-07-11 MED ORDER — PIPERACILLIN-TAZOBACTAM 3.375 G IVPB
3.3750 g | Freq: Four times a day (QID) | INTRAVENOUS | Status: DC
Start: 2023-07-11 — End: 2023-07-14
  Administered 2023-07-11 – 2023-07-13 (×11): 3.375 g via INTRAVENOUS
  Filled 2023-07-11 (×10): qty 50

## 2023-07-11 MED ORDER — PRISMASOL BGK 4/2.5 32-4-2.5 MEQ/L EC SOLN: Status: DC

## 2023-07-11 MED ORDER — HEPARIN SODIUM (PORCINE) 1000 UNIT/ML IJ SOLN
2400.0000 [IU] | Freq: Once | INTRAMUSCULAR | Status: AC
  Administered 2023-07-11: 2400 [IU]

## 2023-07-11 MED ORDER — PIPERACILLIN-TAZOBACTAM IN DEX 2-0.25 GM/50ML IV SOLN
2.2500 g | Freq: Three times a day (TID) | INTRAVENOUS | Status: DC
  Filled 2023-07-11 (×2): qty 50

## 2023-07-11 MED ORDER — THIAMINE MONONITRATE 100 MG PO TABS
100.0000 mg | ORAL_TABLET | Freq: Every day | ORAL | Status: DC
  Administered 2023-07-11 – 2023-07-13 (×3): 100 mg
  Filled 2023-07-11 (×3): qty 1

## 2023-07-11 MED ORDER — HEPARIN (PORCINE) 2000 UNITS/L FOR CRRT: INTRAVENOUS_CENTRAL | Status: DC | PRN

## 2023-07-11 MED ORDER — PROSOURCE TF20 ENFIT COMPATIBL EN LIQD
60.0000 mL | Freq: Two times a day (BID) | ENTERAL | Status: DC
  Administered 2023-07-11 – 2023-07-13 (×5): 60 mL
  Filled 2023-07-11 (×4): qty 60

## 2023-07-11 MED ORDER — PROSOURCE TF20 ENFIT COMPATIBL EN LIQD
60.0000 mL | Freq: Every day | ENTERAL | Status: DC
  Filled 2023-07-11: qty 60

## 2023-07-11 MED ORDER — VITAL 1.5 CAL PO LIQD
1000.0000 mL | ORAL | Status: DC
  Administered 2023-07-11 – 2023-07-12 (×2): 1000 mL

## 2023-07-11 MED ORDER — SODIUM CHLORIDE 0.9 % IV SOLN
500.0000 [IU]/h | INTRAVENOUS | Status: DC
  Administered 2023-07-11 – 2023-07-14 (×5): 500 [IU]/h via INTRAVENOUS_CENTRAL
  Filled 2023-07-11 (×2): qty 2
  Filled 2023-07-11: qty 10000
  Filled 2023-07-11 (×2): qty 2

## 2023-07-11 MED ORDER — ADULT MULTIVITAMIN W/MINERALS CH
1.0000 | ORAL_TABLET | Freq: Every day | ORAL | Status: DC
  Administered 2023-07-11 – 2023-07-13 (×3): 1
  Filled 2023-07-11 (×3): qty 1

## 2023-07-11 MED ORDER — NOREPINEPHRINE 16 MG/250ML-% IV SOLN
0.0000 ug/min | INTRAVENOUS | Status: DC
  Administered 2023-07-11: 14 ug/min via INTRAVENOUS
  Administered 2023-07-13: 2 ug/min via INTRAVENOUS
  Administered 2023-07-14: 20 ug/min via INTRAVENOUS
  Filled 2023-07-11 (×2): qty 250

## 2023-07-11 MED ORDER — VASOPRESSIN 20 UNITS/100 ML INFUSION FOR SHOCK
0.0000 [IU]/min | INTRAVENOUS | Status: DC
  Administered 2023-07-11 (×2): 0.03 [IU]/min via INTRAVENOUS
  Filled 2023-07-11 (×3): qty 100

## 2023-07-11 MED ORDER — VITAL HIGH PROTEIN PO LIQD: 1000.0000 mL | ORAL | Status: DC

## 2023-07-11 MED ORDER — HEPARIN SODIUM (PORCINE) 1000 UNIT/ML DIALYSIS
1000.0000 [IU] | INTRAMUSCULAR | Status: DC | PRN
  Administered 2023-07-13: 2400 [IU] via INTRAVENOUS_CENTRAL
  Filled 2023-07-11: qty 3
  Filled 2023-07-11: qty 6

## 2023-07-11 NOTE — Progress Notes (Signed)
 Nutrition Follow-up  DOCUMENTATION CODES:  Severe malnutrition in context of acute illness/injury  INTERVENTION:  Initiate tube feeding via NGT: Vital 1.5 at 40 ml/h (960 ml per day) Start at 48mL/h and advance by 10mL q24 hours to goal Prosource TF20 60 ml BID Provides 1600 kcal, 105 gm protein, 733 ml free water daily Once nutrition initiated, pt will be at risk for refeeding. Monitor magnesium and phosphorus daily x 3 days, MD to replete as needed, as pt is at risk for refeeding syndrome given malnutrition and prolonged poor PO intake. Thiamine 100mg  x 7 days Although pt is currently volume overloaded, now on CRRT. Would still recommend consideration of TPN as pt has been without adequate nutrition x9 days this admission and weeks prior to admission. Pt at high risk for not tolerating enteral feeds and meets criteria for malnutrition   NUTRITION DIAGNOSIS:  Severe Malnutrition related to acute illness as evidenced by energy intake < or equal to 50% for > or equal to 1 month, moderate muscle depletion. - remains applicable  GOAL:  Patient will meet greater than or equal to 90% of their needs - progressing  MONITOR:  Diet advancement, Labs, I & O's, Weight trends  REASON FOR ASSESSMENT:  Consult Enteral/tube feeding initiation and management, Assessment of nutrition requirement/status  ASSESSMENT:  73 yo female admitted to [facility] 4/7 with multifocal PNA. Transferred to [facility] 4/11 with hypoxic respiratory failure requiring intubation. PMH includes HLD, depression, GERD, anxiety, arthritis.  4/7 - presented to [facility] ED 4/10 - BSE, regular diet/thin 4/11 - transfer to [facility] ICU, intubated 4/12 - TF initiated via OGT 4/13 - extubated 4/15 - reintubated   Patient is currently intubated on ventilator support. NGT placed and imaging shows likely in the duodenum. Consult received for enteral feeds. Will initiate cautiously as pt's blood pressure has fluctuated this AM and is  requiring pressor support. Increases this AM likely due to HD, pt now has been transitioned to CRRT. Pt also at high risk for intolerance due to her ongoing issues with nausea and vomiting which have not been well controlled.   Pt has had received adequate nutrition x9 days this admission and poor PO has been ongoing for weeks. Pt is at high risk for refeeding, added thiamine x 7 days and will initiate feeds slowly and increase q24 hours. Pt would still benefit from TPN is assist in meeting needs until it is demonstrated that enteral feeds will be tolerated.   MV: 11.7 L/min Temp (24hrs), Avg:97.7 F (36.5 C), Min:96.4 F (35.8 C), Max:98.8 F (37.1 C) MAP (cuff):  Propofol: 10.02 ml/hr (265 kcal/d)  Admit weight: 83.3 kg ? Accuracy 4/9 Weight (likely first measured): 79.6 kg  Current weight: 83.5 kg UBW (per pt): 189 lb  6.7% weight loss noted over the last 2 months (2/18-4/13) which is significant. Has likely lost weight this admission but initial weight was copied forward.    Intake/Output Summary (Last 24 hours) at 07/11/2023 1043 Last data filed at 07/11/2023 1000 Gross per 24 hour  Intake 868.37 ml  Output 1677 ml  Net -808.63 ml  Net IO Since Admission: 5,277.28 mL [07/11/23 1043]  Drains/Lines: Temporary HD Catheter, Right IJ NGT, likely post-pyloric  UOP x 24 hours  Nutritionally Relevant Medications: Scheduled Meds:  docusate  100 mg Per Tube BID   PROSource TF20  60 mL Per Tube Daily   VITAL HIGH PROTEIN  1,000 mL Per Tube Q24H   insulin aspart  0-5  Units Subcutaneous QHS   insulin aspart  0-6 Units Subcutaneous TID WC   pantoprazole IV  40 mg Intravenous Q12H   polyethylene glycol  17 g Per Tube Daily   Continuous Infusions:  norepinephrine (LEVOPHED) Adult infusion 5 mcg/min (07/11/23 0600)   piperacillin-tazobactam (ZOSYN)  IV     propofol (DIPRIVAN) infusion 20 mcg/kg/min (07/11/23 0619)   vasopressin     PRN Meds: ondansetron  Labs  Reviewed: BUN 152, creatinine 5.48 Phosphorus 11.9 CBG ranges from 92-156 mg/dL over the last 24 hours HgbA1c 5.8% (4/7)  NUTRITION - FOCUSED PHYSICAL EXAM: Flowsheet Row Most Recent Value  Orbital Region Mild depletion  Upper Arm Region No depletion  Thoracic and Lumbar Region No depletion  Buccal Region No depletion  Temple Region Mild depletion  Clavicle Bone Region Mild depletion  Clavicle and Acromion Bone Region Mild depletion  Scapular Bone Region No depletion  Dorsal Hand No depletion  Patellar Region Moderate depletion  Anterior Thigh Region Moderate depletion  Posterior Calf Region Moderate depletion  Edema (RD Assessment) Mild  Hair Reviewed  Eyes Reviewed  Mouth Reviewed  Skin Reviewed  Nails Reviewed   Diet Order:   Diet Order             Diet NPO time specified  Diet effective now                   EDUCATION NEEDS:  Education needs have been addressed  Skin:  Skin Assessment: Skin Integrity Issues: Skin Integrity Issues:: Stage III Stage III: coccyx (1x1 cm)  Last BM:  4/15 - type 6  Height:  Ht Readings from Last 1 Encounters:  07/10/23 5\' 2"  (1.575 m)    Weight:  Wt Readings from Last 1 Encounters:  07/10/23 83.5 kg    Ideal Body Weight:  50 kg  BMI:  Body mass index is 33.67 kg/m.  Estimated Nutritional Needs:  Kcal:  1600-1800 kcal/d Protein:  100-125 g/d Fluid:  1.6-1.8L/d    Edwena Graham, RD, LDN Registered Dietitian II Please reach out via secure chat

## 2023-07-11 NOTE — Progress Notes (Cosign Needed Addendum)
 NAME:  Sabrina Stein, MRN:  [id number], DOB:  [birth year], LOS: 11 ADMISSION DATE:  07/02/2023, CONSULTATION DATE:  4/11 REFERRING MD:  Velvet Gibbs , CHIEF COMPLAINT:  respiratory failure   History of Present Illness:  Sabrina Stein is a 73 y/o woman with a history of GERD, Depression/anxiety, former tobacco use who presented [facility] ED w/ hypoxic respiratory failure.  Patient underwent colonoscopy on 3/28 showing colitis. Started empirically on cipro /flagyl . Patient having persistent weakness, N/V, and abd pain, and now with sob.  Denies bloody stools or fever.  Came to [facility] on 4/7 for further eval. on arrival patient hypothermic 94.5 F with soft BP.  WBC 15.8.  Cultures obtained, given IV fluids, started on broad-spectrum antibiotics.  Patient also with hypoxia requiring nasal cannula.  CXR concerning for multifocal pneumonia.  Creat elevated 3.09 and elevated LFTs.  Hemoglobin down to 6 and Hemoccult negative.  Transfuse PRBC.  Started on Protonix .  GI consulted.  Also with elevated BNP and troponins.  Cardiology consulted suspect demand ischemia.  Echo showing EF 55 to 70%; grade 1 diastolic.  CT chest, abdomen, pelvis showing multifocal pneumonia.  Patient admitted to floors.  Throughout stay patient with increasing O2 requirements.  On 4/11 now on heated high flow 40L, 80%.  Started on IV steroids and given Lasix .  On Unasyn /doxy.  Patient looking more tired and decision made to proceed with intubation.  PCCM consulted for transfer, will admit to [facility].    Pertinent  Medical History  GERD HLD Depression  Significant Hospital Events: Including procedures, antibiotic start and stop dates in addition to other pertinent events   4/7 admitted to [facility] 4/11 worsening hypoxia requiring intubation; transferred to [facility]; PCCM consulted 4/12 Improved FIO2 and pressor requirement 4/13 Extubated 4/14 Worsening hypoxemia and renal failure overnight 4/15 Intubated for increased O2 requirement and started on  dialysis for suspected volume overload in setting of renal failure  Interim History / Subjective:  Remains on the vent,Cr is improving on CRRT   Objective   Blood pressure 132/73, pulse 96, temperature 98.1 F (36.7 C), temperature source Axillary, resp. rate (!) 26, height 5\' 2"  (1.575 m), weight 81.3 kg, SpO2 96%.    Vent Mode: PRVC FiO2 (%):  [40 %-50 %] 40 % Set Rate:  [28 bmp] 28 bmp Vt Set:  [400 mL] 400 mL PEEP:  [8 cmH20-10 cmH20] 8 cmH20 Plateau Pressure:  [25 cmH20] 25 cmH20   Intake/Output Summary (Last 24 hours) at 07/13/2023 0717 Last data filed at 07/13/2023 0600 Gross per 24 hour  Intake 1472.08 ml  Output 4862 ml  Net -3389.92 ml   Filed Weights   07/08/23 0316 07/10/23 0500 07/12/23 0500  Weight: 80.5 kg 83.5 kg 81.3 kg   Physical Exam: General: Mechanically ventilated  HENT: [place], AT, ETT in place Eyes: EOMI, no scleral icterus Respiratory: Improved crackles  Cardiovascular: RRR, -M/R/G, no JVD GI: BS+, soft, nontender Extremities:-Edema,-tenderness Neuro: Sedated, CNII-XII grossly intact GU: Foley in place  Imaging, labs and test in EMR in the last 24 hours reviewed independently by me. Pertinent findings below:  WBC 28 < 23 CO2 16 BUN/Ccr 33/1.4 improved  Hg 8.5  stable   Resolved Hospital Problem list   Septic shock +/- sedation-related hypotension  Assessment & Plan:   Acute respiratory failure with hypoxia 2/2 pneumonia and pulmonary edema Multifocal pneumonia: Possible aspiration. Strep and legionella neg. MRSA neg, RVP neg Pneumonia initially improved however worsening pulmonary edema in setting of renal failure Intubated 4/11>4/13. Reintubated 4/15.  Plan: - Continue Full vent support, SBT, Wean when appropriate  -BID Duonebs -LTVV, 4-8cc/kg IBW with goal Pplat<30 and DP<15 -VAP and PAD protocol for RASS -1  Septic shock +/- sedation related, dialysis related hypotension - On Levo 4 - WBC 28 < 23,however Afebrile . I wonder if there  is an ongoing active infection processes despite finishing a 7 day course  of Zosyn  and Doxy verses reactive. Not on steroids.  - Trend WBC - F/U on Blood Cx and initiate antibiotics if needed  Acute anemia - Hgb stable at 8.5 today. No signs of active bleeding - Trend CBC - Transfuse with a goal hgb > 7  AKI, on dialysis Plan: - Serum Cr of 1.4 <<1.92 - Continue CRRT - Appreciate Dr Wardell Guys assistance   Colitis N/V/abdominal pain Ileus -seen on colonoscopy on 3/28 -treated with cipro /flagyl  outpt on 4/4 then on unasyn  4/7 on admission - Continue to monitor  Back pain - Lidoderm  patch  HTN HLD Plan: -PRN hydralazine  -statin  Elevated LFTs Plan: -improving -cont to trend  Anxiety/depression Plan: -cont effexor  -hold xanax    Best Practice (right click and "Reselect all SmartList Selections" daily)   Diet/type: tubefeeds DVT prophylaxis prophylactic heparin   Start 4/15 Pressure ulcer(s): N/A GI prophylaxis: PPI Lines: N/A Foley:  Yes, and it is still needed Code Status:  full code Last date of multidisciplinary goals of care discussion [4/15] Confirmed code status with husband and daughter   Critical care time:   The patient is critically ill with multiple organ systems failure and requires high complexity decision making for assessment and support, frequent evaluation and titration of therapies, application of advanced monitoring technologies and extensive interpretation of multiple databases.  Independent Critical Care Time: 35 Minutes.   Richrd Char MD 07/13/2023, 7:17 AM    Please see Amion for pager number to reach on-call Pulmonary and Critical Care Team.

## 2023-07-11 NOTE — Procedures (Signed)
 Received patient in bed at bedside. Informed consent signed and in chart.   TX duration:2.5 hours  Patient had trouble with low BP.  Responds to voice-intubated. Hand-off given to patient's nurse.   Access used: right cath Access issues: none  Total UF removed: 800 ml  Clover Dao, RN [facility] Dialysis [facility]

## 2023-07-11 NOTE — Procedures (Signed)
 Central Venous Catheter Insertion Procedure Note  Sabrina Stein  [id number]  1950/10/25  Date:07/11/23  Time:9:50 AM   Provider Performing:Mykaylah Ballman Tita Form Felipe Horton   Procedure: Insertion of Non-tunneled Central Venous Catheter(36556) with US  guidance (02725)   Indication(s) Medication administration  Consent Risks of the procedure as well as the alternatives and risks of each were explained to the patient and/or caregiver.  Consent for the procedure was obtained and is signed in the bedside chart  Anesthesia Topical only with 1% lidocaine   Timeout Verified patient identification, verified procedure, site/side was marked, verified correct patient position, special equipment/implants available, medications/allergies/relevant history reviewed, required imaging and test results available.  Sterile Technique Maximal sterile technique including full sterile barrier drape, hand hygiene, sterile gown, sterile gloves, mask, hair covering, sterile ultrasound probe cover (if used).  Procedure Description Area of catheter insertion was cleaned with chlorhexidine and draped in sterile fashion.  With real-time ultrasound guidance a central venous catheter was placed into the left internal jugular vein. Nonpulsatile blood flow and easy flushing noted in all ports.  The catheter was sutured in place and sterile dressing applied.  Complications/Tolerance None; patient tolerated the procedure well. Chest X-ray is ordered to verify placement for internal jugular or subclavian cannulation.   Chest x-ray is not ordered for femoral cannulation.  EBL Minimal  Specimen(s) None

## 2023-07-11 NOTE — TOC Progression Note (Signed)
 Transition of Care [facility]) - Progression Note    Patient Details  Name: Sabrina Stein MRN: [id number] Date of Birth: 09-10-1950  Transition of Care [facility]) CM/SW Contact  Juliane Och, LCSW Phone Number: 07/11/2023, 11:25 AM  Clinical Narrative:     11:25 AM Per progressions, patient has been recently re intubated and started CRRT as patient is unable to tolerate HD. Patient has an NG tube but is not currently receiving tube feeds.  Expected Discharge Plan: IP Rehab Facility Barriers to Discharge: Continued Medical Work up  Expected Discharge Plan and Services     Post Acute Care Choice: IP Rehab Living arrangements for the past 2 months: Single Family Home                                       Social Determinants of Health (SDOH) Interventions SDOH Screenings   Food Insecurity: No Food Insecurity (07/02/2023)  Housing: Low Risk  (07/02/2023)  Transportation Needs: No Transportation Needs (07/02/2023)  Utilities: Not At Risk (07/02/2023)  Alcohol Screen: Low Risk  (05/15/2023)  Depression (PHQ2-9): Medium Risk (11/03/2022)  Financial Resource Strain: Low Risk  (05/15/2023)  Physical Activity: Sufficiently Active (05/15/2023)  Social Connections: Moderately Integrated (07/02/2023)  Stress: No Stress Concern Present (05/15/2023)  Tobacco Use: Medium Risk (07/02/2023)    Readmission Risk Interventions    07/02/2023    6:36 PM  Readmission Risk Prevention Plan  Transportation Screening Complete  PCP or Specialist Appt within 5-7 Days Complete  Home Care Screening Complete  Medication Review (RN CM) Complete

## 2023-07-11 NOTE — Progress Notes (Signed)
 PT Cancellation Note  Patient Details Name: Sabrina Stein MRN: [id number] DOB: 06-Apr-1950   Cancelled Treatment:    Reason Eval/Treat Not Completed: (P) Medical issues which prohibited therapy (Patient intubated and requiring BP medications due to persistent low blood pressure, also started CRRT.) Will continue efforts per PT plan of care as schedule permits once medically appropriate.   Sabrina Stein M Sabrina Stein 07/11/2023, 8:10 AM

## 2023-07-11 NOTE — Progress Notes (Signed)
 OT Cancellation Note  Patient Details Name: Sabrina Stein MRN: [id number] DOB: 28-Jun-1950   Cancelled Treatment:    Reason Eval/Treat Not Completed: Patient not medically ready;Medical issues which prohibited therapy Patient remains intubated and requiring BP medications due to persistent low blood pressure. OT will follow back when patient is more medically appropriate and able to participate in OT evaluation.   Mollie Anger E. [place] Huberty, OTR/L Acute Rehabilitation Services [phone]   Vincent Greek 07/11/2023, 7:56 AM

## 2023-07-11 NOTE — Progress Notes (Signed)
 Patient ID: Sabrina Stein, female   DOB: [birth year], 73 y.o.   MRN: [id number] S: Had worsening oxygenation yesterday and was re-intubated.  Attempted IHD early this am with drops in her Bp and need to start levophed.  Discussed with Dr. Washington Hacker and family and will proceed with CRRT. O:BP (!) 89/58   Pulse 80   Temp (!) 96.4 F (35.8 C) (Axillary)   Resp (!) 22   Ht 5\' 2"  (1.575 m)   Wt 83.5 kg   SpO2 97%   BMI 33.67 kg/m   Intake/Output Summary (Last 24 hours) at 07/11/2023 0842 Last data filed at 07/11/2023 0600 Gross per 24 hour  Intake 597.03 ml  Output 1142 ml  Net -544.97 ml   Intake/Output: I/O last 3 completed shifts: In: 697 [I.V.:497; IV Piggyback:200] Out: 1767 [Urine:1665; Emesis/NG output:100; Stool:2]  Intake/Output this shift:  No intake/output data recorded. Weight change:  Gen: intubated and sedated CVS: RRR Resp: ventilated BS bilaterally Abd: +BS, soft, NT/ND Ext: trace edema  Recent Labs  Lab 07/06/23 0504 07/06/23 2341 07/07/23 0357 07/07/23 1247 07/07/23 1752 07/08/23 0816 07/08/23 1838 07/09/23 0849 07/10/23 0228 07/10/23 1528 07/10/23 2351 07/11/23 0301  NA 136   < > 137  --   --  135  --  138 137 136 137 139  K 3.5   < > 4.6  --   --  4.3  --  4.1 4.5 4.5 4.6 4.9  CL 103  --  98  --   --  97*  --  99 98  --   --  99  CO2 22  --  24  --   --  21*  --  19* 15*  --   --  16*  GLUCOSE 118*  --  183*  --   --  134*  --  90 103*  --   --  139*  BUN 30*  --  37*  --   --  64*  --  96* 121*  --   --  152*  CREATININE 1.23*  --  1.91*  --   --  3.22*  --  4.04* 4.60*  --   --  5.48*  ALBUMIN  --   --  1.8*  --   --  1.7*  --  1.8*  --   --   --  1.7*  CALCIUM 8.4*  --  8.5*  --   --  8.2*  --  8.2* 8.4*  --   --  8.9  PHOS  --   --   --  7.8* 7.9* 9.3* 7.7* 8.1* 9.0*  --   --  11.9*  AST  --   --  55*  --   --  31  --  37  --   --   --   --   ALT  --   --  43  --   --  31  --  28  --   --   --   --    < > = values in this interval not displayed.    Liver Function Tests: Recent Labs  Lab 07/07/23 0357 07/08/23 0816 07/09/23 0849 07/11/23 0301  AST 55* 31 37  --   ALT 43 31 28  --   ALKPHOS 476* 297* 306*  --   BILITOT 2.4* 2.7* 2.1*  --   PROT 6.8 6.8 6.5  --   ALBUMIN 1.8* 1.7* 1.8* 1.7*   No  results for input(s): "LIPASE", "AMYLASE" in the last 168 hours. No results for input(s): "AMMONIA" in the last 168 hours. CBC: Recent Labs  Lab 07/07/23 0357 07/08/23 0816 07/08/23 1838 07/09/23 0849 07/10/23 0228 07/10/23 1528 07/10/23 2351 07/11/23 0301  WBC 17.5* 16.4* 12.5* 12.1* 14.6*  --   --  21.3*  NEUTROABS 14.9*  --   --   --   --   --   --   --   HGB 8.3* 7.0* 6.9* 8.1* 8.3* 8.5* 10.5* 8.0*  HCT 26.1* 23.6* 22.3* 25.0* 25.6* 25.0* 31.0* 24.2*  MCV 94.2 97.9 95.7 89.3 89.5  --   --  87.4  PLT 445* 465* 337 301 303  --   --  393   Cardiac Enzymes: No results for input(s): "CKTOTAL", "CKMB", "CKMBINDEX", "TROPONINI" in the last 168 hours. CBG: Recent Labs  Lab 07/10/23 1522 07/10/23 1918 07/10/23 2214 07/11/23 0307 07/11/23 0713  GLUCAP 141* 156* 131* 134* 110*    Iron Studies: No results for input(s): "IRON", "TIBC", "TRANSFERRIN", "FERRITIN" in the last 72 hours. Studies/Results: DG Abd Portable 1V Result Date: 07/10/2023 CLINICAL DATA:  NGT placement. EXAM: PORTABLE ABDOMEN - 1 VIEW COMPARISON:  07/09/2023. FINDINGS: Limited image including upper and demonstrates NG tube with the tip superimposed with the gastric antrum or duodenum in the right mid abdomen. IMPRESSION: NG tube as above. Electronically Signed   By: Sydell Eva M.D.   On: 07/10/2023 20:36   DG CHEST PORT 1 VIEW Result Date: 07/10/2023 CLINICAL DATA:  Status post bronchoscopy. EXAM: PORTABLE CHEST 1 VIEW COMPARISON:  Chest x-ray 07/09/2023. FINDINGS: Endotracheal tube tip is 4.6 cm above the carina. Right-sided central venous catheter tip ends in the SVC. Diffuse bilateral airspace opacities have not significantly changed. There is no  pleural effusion or pneumothorax. The heart is mildly enlarged. The osseous structures are stable. IMPRESSION: 1. Endotracheal tube tip is 4.6 cm above the carina. 2. Stable diffuse bilateral airspace opacities. Electronically Signed   By: Tyron Gallon M.D.   On: 07/10/2023 18:50   DG Abd 1 View Result Date: 07/09/2023 CLINICAL DATA:  Abdominal distension, pain EXAM: ABDOMEN - 1 VIEW COMPARISON:  None Available. FINDINGS: Nonobstructive bowel gas pattern. No organomegaly or free air. Severe diffuse bilateral airspace disease noted. IMPRESSION: No evidence of bowel obstruction or free air. Electronically Signed   By: Janeece Mechanic M.D.   On: 07/09/2023 21:02   DG CHEST PORT 1 VIEW Result Date: 07/09/2023 CLINICAL DATA:  Hypoxia EXAM: PORTABLE CHEST 1 VIEW COMPARISON:  07/06/2023 FINDINGS: Severe diffuse bilateral airspace disease, worsening since prior study. Heart borderline in size. Mediastinal contours within normal limits. No visible effusions. No acute bony abnormality. IMPRESSION: Worsening severe diffuse bilateral airspace disease. Electronically Signed   By: Janeece Mechanic M.D.   On: 07/09/2023 21:02    sodium chloride   Intravenous Once   Chlorhexidine Gluconate Cloth  6 each Topical Q0600   Chlorhexidine Gluconate Cloth  6 each Topical Q0600   docusate  100 mg Per Tube BID   feeding supplement (PROSource TF20)  60 mL Per Tube Daily   feeding supplement (VITAL HIGH PROTEIN)  1,000 mL Per Tube Q24H   heparin injection (subcutaneous)  5,000 Units Subcutaneous Q8H   insulin aspart  0-5 Units Subcutaneous QHS   insulin aspart  0-6 Units Subcutaneous TID WC   ipratropium-albuterol  3 mL Nebulization BID   lidocaine  1 patch Transdermal Q24H   mouth rinse  15 mL Mouth Rinse  Q2H   pantoprazole (PROTONIX) IV  40 mg Intravenous Q12H   polyethylene glycol  17 g Per Tube Daily   sodium chloride flush  3 mL Intravenous Q12H    BMET    Component Value Date/Time   NA 139 07/11/2023 0301   NA 138  11/08/2022 1336   K 4.9 07/11/2023 0301   CL 99 07/11/2023 0301   CO2 16 (L) 07/11/2023 0301   GLUCOSE 139 (H) 07/11/2023 0301   BUN 152 (H) 07/11/2023 0301   BUN 23 11/08/2022 1336   CREATININE 5.48 (H) 07/11/2023 0301   CALCIUM 8.9 07/11/2023 0301   GFRNONAA 8 (L) 07/11/2023 0301   GFRAA 96 04/08/2020 1630   CBC    Component Value Date/Time   WBC 21.3 (H) 07/11/2023 0301   RBC 2.77 (L) 07/11/2023 0301   HGB 8.0 (L) 07/11/2023 0301   HGB 11.8 11/03/2022 0940   HCT 24.2 (L) 07/11/2023 0301   HCT 34.6 11/03/2022 0940   PLT 393 07/11/2023 0301   PLT 330 11/03/2022 0940   MCV 87.4 07/11/2023 0301   MCV 94 11/03/2022 0940   MCH 28.9 07/11/2023 0301   MCHC 33.1 07/11/2023 0301   RDW 16.0 (H) 07/11/2023 0301   RDW 12.9 11/03/2022 0940   LYMPHSABS 1.3 07/07/2023 0357   LYMPHSABS 1.4 11/03/2022 0940   MONOABS 0.7 07/07/2023 0357   EOSABS 0.0 07/07/2023 0357   EOSABS 0.1 11/03/2022 0940   BASOSABS 0.0 07/07/2023 0357   BASOSABS 0.1 11/03/2022 0940    Assessment/Plan:  AKI/CKD Stage III- likely recurrent ischemic ATN.  Initially presented to [facility] and Scr was up to 3.3 due to volume depletion due to N/V/D and poor po intake.    That improved to Scr of 1.23 on 07/06/23.  The second episode of ischemic ATN was in the setting of shock and need for pressors further complicated by ABLA.  She has received blood transfusions and she was off of pressors.  No need for renal US as CT of abdomen and pelvis did not show obstruction.  She initially had good UOP but over the past 3 days has become oliguric.  Not really responding to IV lasix and attempted IHD early this morning but became hypotensive and had to start levophed.  I discussed the case with Dr. Everardo All and her daughter, both agree with transitioning to CRRT to help with volume status and oxygenation.  Avoid nephrotoxic medications including NSAIDs and iodinated intravenous contrast exposure unless the latter is absolutely indicated.    Preferred narcotic agents for pain control are hydromorphone, fentanyl, and methadone. Morphine should not be used.  Avoid Baclofen and avoid oral sodium phosphate and magnesium citrate based laxatives / bowel preps.  Continue strict Input and Output monitoring.  Will monitor the patient closely with you and intervene or adjust therapy as indicated by changes in clinical status/labs  Acute hypoxic respiratory failure due to multifocal pneumonia - extubated 07/08/23 and re-intubated on 07/10/23.  Completed IV zosyn 07/10/23 per PCCM.  Now with respiratory acidosis and ongoing hypoxia.  Volume likely playing a role in the hypoxia.  Will start CRRT and UF as tolerated.  Only able to UF 800 mL with IHD. Septic shock - was off of pressors for a day, but needed to resume after attempting IHD.  Now back on levophed.   Colitis - GI following.   HTN - was low, now improved.  Continue to follow.  Elevated LFT's - likely due to shock liver.  Improving.  Benjamin Brands, MD [facility]

## 2023-07-12 DIAGNOSIS — J810 Acute pulmonary edema: Secondary | ICD-10-CM | POA: Diagnosis not present

## 2023-07-12 DIAGNOSIS — D649 Anemia, unspecified: Secondary | ICD-10-CM | POA: Diagnosis not present

## 2023-07-12 DIAGNOSIS — J189 Pneumonia, unspecified organism: Secondary | ICD-10-CM | POA: Diagnosis not present

## 2023-07-12 DIAGNOSIS — J9601 Acute respiratory failure with hypoxia: Secondary | ICD-10-CM | POA: Diagnosis not present

## 2023-07-12 DIAGNOSIS — D72829 Elevated white blood cell count, unspecified: Secondary | ICD-10-CM

## 2023-07-12 LAB — GLUCOSE, CAPILLARY
Glucose-Capillary: 120 mg/dL — ABNORMAL HIGH (ref 70–99)
Glucose-Capillary: 143 mg/dL — ABNORMAL HIGH (ref 70–99)
Glucose-Capillary: 153 mg/dL — ABNORMAL HIGH (ref 70–99)
Glucose-Capillary: 146 mg/dL — ABNORMAL HIGH (ref 70–99)
Glucose-Capillary: 138 mg/dL — ABNORMAL HIGH (ref 70–99)
Glucose-Capillary: 140 mg/dL — ABNORMAL HIGH (ref 70–99)

## 2023-07-12 LAB — CBC
HCT: 22.8 % — ABNORMAL LOW (ref 36.0–46.0)
Hemoglobin: 7.5 g/dL — ABNORMAL LOW (ref 12.0–15.0)
MCH: 29 pg (ref 26.0–34.0)
MCHC: 32.9 g/dL (ref 30.0–36.0)
MCV: 88 fL (ref 80.0–100.0)
Platelets: 425 10*3/uL — ABNORMAL HIGH (ref 150–400)
RBC: 2.59 MIL/uL — ABNORMAL LOW (ref 3.87–5.11)
RDW: 16.1 % — ABNORMAL HIGH (ref 11.5–15.5)
WBC: 23.7 10*3/uL — ABNORMAL HIGH (ref 4.0–10.5)
nRBC: 1.5 % — ABNORMAL HIGH (ref 0.0–0.2)

## 2023-07-12 LAB — APTT: aPTT: 44 s — ABNORMAL HIGH (ref 24–36)

## 2023-07-12 LAB — RENAL FUNCTION PANEL
Albumin: 1.8 g/dL — ABNORMAL LOW (ref 3.5–5.0)
Albumin: 1.8 g/dL — ABNORMAL LOW (ref 3.5–5.0)
Anion gap: 11 (ref 5–15)
Anion gap: 13 (ref 5–15)
BUN: 50 mg/dL — ABNORMAL HIGH (ref 8–23)
BUN: 42 mg/dL — ABNORMAL HIGH (ref 8–23)
CO2: 24 mmol/L (ref 22–32)
CO2: 25 mmol/L (ref 22–32)
Calcium: 8.6 mg/dL — ABNORMAL LOW (ref 8.9–10.3)
Calcium: 8.5 mg/dL — ABNORMAL LOW (ref 8.9–10.3)
Chloride: 102 mmol/L (ref 98–111)
Chloride: 100 mmol/L (ref 98–111)
Creatinine, Ser: 1.92 mg/dL — ABNORMAL HIGH (ref 0.44–1.00)
Creatinine, Ser: 1.46 mg/dL — ABNORMAL HIGH (ref 0.44–1.00)
GFR, Estimated: 27 mL/min — ABNORMAL LOW (ref >60)
GFR, Estimated: 38 mL/min — ABNORMAL LOW (ref >60)
Glucose, Bld: 133 mg/dL — ABNORMAL HIGH (ref 70–99)
Glucose, Bld: 149 mg/dL — ABNORMAL HIGH (ref 70–99)
Phosphorus: 4.1 mg/dL (ref 2.5–4.6)
Phosphorus: 3.1 mg/dL (ref 2.5–4.6)
Potassium: 4.3 mmol/L (ref 3.5–5.1)
Potassium: 4.4 mmol/L (ref 3.5–5.1)
Sodium: 137 mmol/L (ref 135–145)
Sodium: 138 mmol/L (ref 135–145)

## 2023-07-12 LAB — MAGNESIUM
Magnesium: 2.6 mg/dL — ABNORMAL HIGH (ref 1.7–2.4)
Magnesium: 2.8 mg/dL — ABNORMAL HIGH (ref 1.7–2.4)

## 2023-07-12 LAB — PHOSPHORUS: Phosphorus: 3.1 mg/dL (ref 2.5–4.6)

## 2023-07-12 NOTE — Procedures (Signed)
 I was present at this session of CRRT. I have reviewed the session itself and made appropriate changes.  She is tolerating CRRT well and has been weaned off of pressors.  Able to UF 150 mL/hr.  Continue with current prescription.   Vital signs in last 24 hours:  Temp:  [98.1 F (36.7 C)-98.7 F (37.1 C)] 98.7 F (37.1 C) (04/17 0729) Pulse Rate:  [70-92] 89 (04/17 0758) Resp:  [14-34] 34 (04/17 0758) BP: (85-145)/(52-80) 110/64 (04/17 0758) SpO2:  [93 %-98 %] 98 % (04/17 0758) FiO2 (%):  [50 %] 50 % (04/17 0758) Weight:  [81.3 kg] 81.3 kg (04/17 0500) Weight change:  Filed Weights   07/08/23 0316 07/10/23 0500 07/12/23 0500  Weight: 80.5 kg 83.5 kg 81.3 kg    Recent Labs  Lab 07/12/23 0500  NA 137  K 4.3  CL 102  CO2 24  GLUCOSE 133*  BUN 50*  CREATININE 1.92*  CALCIUM 8.6*  PHOS 4.1    Recent Labs  Lab 07/07/23 0357 07/08/23 0816 07/10/23 0228 07/10/23 1528 07/10/23 2351 07/11/23 0301 07/12/23 0500  WBC 17.5*   < > 14.6*  --   --  21.3* 23.7*  NEUTROABS 14.9*  --   --   --   --   --   --   HGB 8.3*   < > 8.3*   < > 10.5* 8.0* 7.5*  HCT 26.1*   < > 25.6*   < > 31.0* 24.2* 22.8*  MCV 94.2   < > 89.5  --   --  87.4 88.0  PLT 445*   < > 303  --   --  393 425*   < > = values in this interval not displayed.    Scheduled Meds:  sodium chloride   Intravenous Once   Chlorhexidine Gluconate Cloth  6 each Topical Q0600   Chlorhexidine Gluconate Cloth  6 each Topical Q0600   docusate  100 mg Per Tube BID   feeding supplement (PROSource TF20)  60 mL Per Tube BID   insulin aspart  0-6 Units Subcutaneous Q4H   ipratropium-albuterol  3 mL Nebulization BID   lidocaine  1 patch Transdermal Q24H   multivitamin with minerals  1 tablet Per Tube Daily   mouth rinse  15 mL Mouth Rinse Q2H   pantoprazole (PROTONIX) IV  40 mg Intravenous Q12H   polyethylene glycol  17 g Per Tube Daily   sodium chloride flush  3 mL Intravenous Q12H   thiamine  100 mg Per Tube Daily    Continuous Infusions:  feeding supplement (VITAL 1.5 CAL) 20 mL/hr at 07/12/23 0700   fentaNYL infusion INTRAVENOUS 100 mcg/hr (07/12/23 0700)   heparin 10,000 units/ 20 mL infusion syringe 500 Units/hr (07/12/23 0728)   norepinephrine (LEVOPHED) Adult infusion Stopped (07/12/23 0626)   piperacillin-tazobactam (ZOSYN)  IV Stopped (07/12/23 0545)   prismasol BGK 4/2.5 400 mL/hr at 07/12/23 0217   prismasol BGK 4/2.5 400 mL/hr at 07/12/23 0220   prismasol BGK 4/2.5 1,500 mL/hr at 07/12/23 4782   propofol (DIPRIVAN) infusion 30 mcg/kg/min (07/12/23 0700)   PRN Meds:.acetaminophen **OR** acetaminophen, albuterol, fentaNYL, heparin, heparin, hydrALAZINE, mouth rinse, polyvinyl alcohol, sodium chloride flush, white petrolatum   Irena Cords,  MD 07/12/2023, 8:50 AM

## 2023-07-12 NOTE — Progress Notes (Signed)
 OT Cancellation Note  Patient Details Name: Palmina Clodfelter MRN: [id number] DOB: Jan 25, 1951   Cancelled Treatment:    Reason Eval/Treat Not Completed: Patient not medically ready;Medical issues which prohibited therapy remains intubated, sedated and initiated CRRT. OT will sign off and await new orders when medically stable for therapies.   Eulalie Speights 07/12/2023, 6:55 AM

## 2023-07-12 NOTE — Progress Notes (Signed)
 NAME:  Sabrina Stein, MRN:  [id number], DOB:  [birth year], LOS: 10 ADMISSION DATE:  07/02/2023, CONSULTATION DATE:  4/11 REFERRING MD:  Maud Deed , CHIEF COMPLAINT:  respiratory failure   History of Present Illness:  Sabrina Stein is a 72 y/o woman with a history of GERD, Depression/anxiety, former tobacco use who presented [facility] ED w/ hypoxic respiratory failure.  Patient underwent colonoscopy on 3/28 showing colitis. Started empirically on cipro/flagyl. Patient having persistent weakness, N/V, and abd pain, and now with sob.  Denies bloody stools or fever.  Came to [facility] on 4/7 for further eval. on arrival patient hypothermic 94.5 F with soft BP.  WBC 15.8.  Cultures obtained, given IV fluids, started on broad-spectrum antibiotics.  Patient also with hypoxia requiring nasal cannula.  CXR concerning for multifocal pneumonia.  Creat elevated 3.09 and elevated LFTs.  Hemoglobin down to 6 and Hemoccult negative.  Transfuse PRBC.  Started on Protonix.  GI consulted.  Also with elevated BNP and troponins.  Cardiology consulted suspect demand ischemia.  Echo showing EF 55 to 70%; grade 1 diastolic.  CT chest, abdomen, pelvis showing multifocal pneumonia.  Patient admitted to floors.  Throughout stay patient with increasing O2 requirements.  On 4/11 now on heated high flow 40L, 80%.  Started on IV steroids and given Lasix.  On Unasyn/doxy.  Patient looking more tired and decision made to proceed with intubation.  PCCM consulted for transfer, will admit to [facility].    Pertinent  Medical History  GERD HLD Depression  Significant Hospital Events: Including procedures, antibiotic start and stop dates in addition to other pertinent events   4/7 admitted to [facility] 4/11 worsening hypoxia requiring intubation; transferred to [facility]; PCCM consulted 4/12 Improved FIO2 and pressor requirement 4/13 Extubated 4/14 Worsening hypoxemia and renal failure overnight 4/15 Intubated for increased O2 requirement and started on  dialysis for suspected volume overload in setting of renal failure 4/16 Tolerating CRRT  Interim History / Subjective:   Improved vent requirements Decreased pressor requirements. Nearly weaned off levophed Addressed questions with daughter at bedside  Objective   Blood pressure (!) 97/52, pulse 83, temperature 98.3 F (36.8 C), temperature source Oral, resp. rate 17, height 5\' 2"  (1.575 m), weight 81.3 kg, SpO2 96%.    Vent Mode: PRVC FiO2 (%):  [50 %] 50 % Set Rate:  [28 bmp] 28 bmp Vt Set:  [400 mL] 400 mL PEEP:  [12 cmH20] 12 cmH20 Plateau Pressure:  [28 cmH20] 28 cmH20   Intake/Output Summary (Last 24 hours) at 07/12/2023 0730 Last data filed at 07/12/2023 0700 Gross per 24 hour  Intake 1670.78 ml  Output 4021.2 ml  Net -2350.42 ml   Filed Weights   07/08/23 0316 07/10/23 0500 07/12/23 0500  Weight: 80.5 kg 83.5 kg 81.3 kg   Physical Exam: General: Elderly-appearing, sedated HENT: [place], AT, ETT in place Eyes: EOMI, no scleral icterus Respiratory: Improved bilateral crackles auscultation.  No wheezing  Cardiovascular: RRR, -M/R/G, no JVD GI: BS+, soft, nontender Extremities:-Edema,-tenderness Neuro: Sedated, PERRL, CNII-XII grossly intact GU: Foley in place  Imaging, labs and test noted above have been reviewed independently by me.  WBC 23 CO2 24 BUN/Ccr 152/5.48 WBC 21 Hg 8 stable Resolved Hospital Problem list   Septic shock +/- sedation-related hypotension  Assessment & Plan:   Acute respiratory failure with hypoxia 2/2 pneumonia and pulmonary edema Multifocal pneumonia: Possible aspiration. Strep and legionella neg. MRSA neg, RVP neg Pneumonia initially improved however worsening pulmonary edema in setting of renal failure Intubated  4/11>4/13. Reintubated 4/15 Trach asp neg few candida, likely noncontributory Plan: -Complete doxy x 7 days -Plan for Zosyn x 7 days (end 07/12/23) -BID Duonebs -Full vent support -LTVV, 4-8cc/kg IBW with goal Pplat<30  and DP<15 -VAP and PAD protocol for RASS -1: Fentanyl and Propofol  Septic shock +/- sedation related, dialysis related hypotension Normal LA -Off levophed. Goal MAP >65 -F/u  blood cultures -Continue Zosyn as above  Acute anemia - stable for > 48 hours -s/p 2 units PRBC 4/7; fecal occult negative -S/p 1 unit PRBC 4/13 -hx of diclofenac use Plan: -cont PPI BID -will likely need GI consult if persistent anemia. Once hypoxemia improves, will re-engage consult team if needed  Leukocytosis - increasing. Treating for pneumonia, no other sources of infxn -Trend -Zosyn as above -F/u final cultures  AKI, on dialysis Plan: -Appreciate Nephrology input. Continue CRRT -Trend BMP / urinary output -Replace electrolytes as indicated -Avoid nephrotoxic agents, ensure adequate renal perfusion  Colitis N/V/abdominal pain Ileus -seen on colonoscopy on 3/28 -treated with cipro/flagyl outpt on 4/4 then on unasyn 4/7 on admission Plan: -zosyn as above -Bowel regimen  Back pain - Lidoderm patch  HTN HLD Plan: -PRN hydralazine -statin  Elevated LFTs Plan: -improving -cont to trend  Anxiety/depression Plan: -cont effexor -hold xanax   Best Practice (right click and "Reselect all SmartList Selections" daily)   Diet/type: tubefeeds DVT prophylaxis other Heparin via CRRT Pressure ulcer(s): present on admission  GI prophylaxis: PPI Lines: Central line Foley:  Yes, and it is still needed Code Status:  full code Last date of multidisciplinary goals of care discussion [4/15] Confirmed code status with husband and daughter  Updated daughter on 4/17    Critical care time: 36 minutes   The patient is critically ill with multiple organ systems failure and requires high complexity decision making for assessment and support, frequent evaluation and titration of therapies, application of advanced monitoring technologies and extensive interpretation of multiple databases.   Independent Critical Care Time: 36 Minutes.   Sabrina Stein, M.D. [facility] Pulmonary/Critical Care Medicine 07/12/2023 7:31 AM   Please see Amion for pager number to reach on-call Pulmonary and Critical Care Team.

## 2023-07-12 NOTE — Progress Notes (Signed)
 PT Cancellation Note  Patient Details Name: Sabrina Stein MRN: [id number] DOB: 11-15-1950   Cancelled Treatment:    Reason Eval/Treat Not Completed: Patient not medically ready (intubated, CRRT, not medically stable will sign off and await reorder)   Governor Matos B Ramello Cordial 07/12/2023, 9:51 AM Annis Baseman, PT Acute Rehabilitation Services Office: [phone]

## 2023-07-13 ENCOUNTER — Inpatient Hospital Stay (HOSPITAL_COMMUNITY)

## 2023-07-13 DIAGNOSIS — Z9911 Dependence on respirator [ventilator] status: Secondary | ICD-10-CM

## 2023-07-13 DIAGNOSIS — G9340 Encephalopathy, unspecified: Secondary | ICD-10-CM

## 2023-07-13 DIAGNOSIS — J810 Acute pulmonary edema: Secondary | ICD-10-CM | POA: Diagnosis not present

## 2023-07-13 DIAGNOSIS — R569 Unspecified convulsions: Secondary | ICD-10-CM

## 2023-07-13 DIAGNOSIS — L8990 Pressure ulcer of unspecified site, unspecified stage: Secondary | ICD-10-CM | POA: Insufficient documentation

## 2023-07-13 DIAGNOSIS — R4182 Altered mental status, unspecified: Secondary | ICD-10-CM

## 2023-07-13 DIAGNOSIS — R1110 Vomiting, unspecified: Secondary | ICD-10-CM

## 2023-07-13 DIAGNOSIS — J9601 Acute respiratory failure with hypoxia: Secondary | ICD-10-CM

## 2023-07-13 DIAGNOSIS — J189 Pneumonia, unspecified organism: Secondary | ICD-10-CM | POA: Diagnosis not present

## 2023-07-13 LAB — RENAL FUNCTION PANEL
Albumin: 2.1 g/dL — ABNORMAL LOW (ref 3.5–5.0)
Albumin: 2 g/dL — ABNORMAL LOW (ref 3.5–5.0)
Anion gap: 15 (ref 5–15)
Anion gap: 17 — ABNORMAL HIGH (ref 5–15)
BUN: 33 mg/dL — ABNORMAL HIGH (ref 8–23)
BUN: 56 mg/dL — ABNORMAL HIGH (ref 8–23)
CO2: 25 mmol/L (ref 22–32)
CO2: 23 mmol/L (ref 22–32)
Calcium: 9.1 mg/dL (ref 8.9–10.3)
Calcium: 8.8 mg/dL — ABNORMAL LOW (ref 8.9–10.3)
Chloride: 97 mmol/L — ABNORMAL LOW (ref 98–111)
Chloride: 99 mmol/L (ref 98–111)
Creatinine, Ser: 1.4 mg/dL — ABNORMAL HIGH (ref 0.44–1.00)
Creatinine, Ser: 2.17 mg/dL — ABNORMAL HIGH (ref 0.44–1.00)
GFR, Estimated: 40 mL/min — ABNORMAL LOW (ref >60)
GFR, Estimated: 24 mL/min — ABNORMAL LOW (ref >60)
Glucose, Bld: 158 mg/dL — ABNORMAL HIGH (ref 70–99)
Glucose, Bld: 146 mg/dL — ABNORMAL HIGH (ref 70–99)
Phosphorus: 3.2 mg/dL (ref 2.5–4.6)
Phosphorus: 5.3 mg/dL — ABNORMAL HIGH (ref 2.5–4.6)
Potassium: 4.8 mmol/L (ref 3.5–5.1)
Potassium: 5.3 mmol/L — ABNORMAL HIGH (ref 3.5–5.1)
Sodium: 137 mmol/L (ref 135–145)
Sodium: 139 mmol/L (ref 135–145)

## 2023-07-13 LAB — CBC
HCT: 26.9 % — ABNORMAL LOW (ref 36.0–46.0)
Hemoglobin: 8.5 g/dL — ABNORMAL LOW (ref 12.0–15.0)
MCH: 28.8 pg (ref 26.0–34.0)
MCHC: 31.6 g/dL (ref 30.0–36.0)
MCV: 91.2 fL (ref 80.0–100.0)
Platelets: 581 10*3/uL — ABNORMAL HIGH (ref 150–400)
RBC: 2.95 MIL/uL — ABNORMAL LOW (ref 3.87–5.11)
RDW: 17 % — ABNORMAL HIGH (ref 11.5–15.5)
WBC: 28.4 10*3/uL — ABNORMAL HIGH (ref 4.0–10.5)
nRBC: 1.3 % — ABNORMAL HIGH (ref 0.0–0.2)

## 2023-07-13 LAB — HEPATIC FUNCTION PANEL
ALT: 20 U/L (ref 0–44)
AST: 34 U/L (ref 15–41)
Albumin: 2.1 g/dL — ABNORMAL LOW (ref 3.5–5.0)
Alkaline Phosphatase: 241 U/L — ABNORMAL HIGH (ref 38–126)
Bilirubin, Direct: 0.8 mg/dL — ABNORMAL HIGH (ref 0.0–0.2)
Indirect Bilirubin: 0.9 mg/dL (ref 0.3–0.9)
Total Bilirubin: 1.7 mg/dL — ABNORMAL HIGH (ref 0.0–1.2)
Total Protein: 8.1 g/dL (ref 6.5–8.1)

## 2023-07-13 LAB — BLOOD GAS, VENOUS
Acid-Base Excess: 2.8 mmol/L — ABNORMAL HIGH (ref 0.0–2.0)
Bicarbonate: 27.2 mmol/L (ref 20.0–28.0)
O2 Saturation: 66.4 %
Patient temperature: 37
pCO2, Ven: 40 mmHg — ABNORMAL LOW (ref 44–60)
pH, Ven: 7.44 — ABNORMAL HIGH (ref 7.25–7.43)
pO2, Ven: 39 mmHg (ref 32–45)

## 2023-07-13 LAB — GLUCOSE, CAPILLARY
Glucose-Capillary: 163 mg/dL — ABNORMAL HIGH (ref 70–99)
Glucose-Capillary: 174 mg/dL — ABNORMAL HIGH (ref 70–99)
Glucose-Capillary: 194 mg/dL — ABNORMAL HIGH (ref 70–99)
Glucose-Capillary: 144 mg/dL — ABNORMAL HIGH (ref 70–99)
Glucose-Capillary: 158 mg/dL — ABNORMAL HIGH (ref 70–99)
Glucose-Capillary: 143 mg/dL — ABNORMAL HIGH (ref 70–99)

## 2023-07-13 LAB — APTT: aPTT: 40 s — ABNORMAL HIGH (ref 24–36)

## 2023-07-13 LAB — LACTIC ACID, PLASMA
Lactic Acid, Venous: 1.5 mmol/L (ref 0.5–1.9)
Lactic Acid, Venous: 1.1 mmol/L (ref 0.5–1.9)

## 2023-07-13 LAB — AMMONIA: Ammonia: 25 umol/L (ref 9–35)

## 2023-07-13 LAB — MAGNESIUM: Magnesium: 2.9 mg/dL — ABNORMAL HIGH (ref 1.7–2.4)

## 2023-07-13 NOTE — Progress Notes (Signed)
 RT attempted ABG x2 without success. Changing order to VBG per Dr.Farhana Fellows.

## 2023-07-13 NOTE — Procedures (Signed)
 I was present at this session of CRRT. I have reviewed the session itself and made appropriate changes.  Continue with CRRT and UF as tolerated.  Improving oxygen requirements.  Vital signs in last 24 hours:  Temp:  [97.8 F (36.6 C)-98.4 F (36.9 C)] 97.9 F (36.6 C) (04/18 0751) Pulse Rate:  [68-105] 101 (04/18 0845) Resp:  [14-36] 29 (04/18 0845) BP: (71-137)/(39-78) 114/63 (04/18 0845) SpO2:  [90 %-98 %] 95 % (04/18 0845) FiO2 (%):  [40 %-50 %] 40 % (04/18 0813) Weight change:  Filed Weights   07/08/23 0316 07/10/23 0500 07/12/23 0500  Weight: 80.5 kg 83.5 kg 81.3 kg    Recent Labs  Lab 07/13/23 0510  NA 137  K 4.8  CL 97*  CO2 25  GLUCOSE 158*  BUN 33*  CREATININE 1.40*  CALCIUM  9.1  PHOS 3.2    Recent Labs  Lab 07/07/23 0357 07/08/23 0816 07/11/23 0301 07/12/23 0500 07/13/23 0510  WBC 17.5*   < > 21.3* 23.7* 28.4*  NEUTROABS 14.9*  --   --   --   --   HGB 8.3*   < > 8.0* 7.5* 8.5*  HCT 26.1*   < > 24.2* 22.8* 26.9*  MCV 94.2   < > 87.4 88.0 91.2  PLT 445*   < > 393 425* 581*   < > = values in this interval not displayed.    Scheduled Meds:  sodium chloride    Intravenous Once   Chlorhexidine  Gluconate Cloth  6 each Topical Q0600   Chlorhexidine  Gluconate Cloth  6 each Topical Q0600   docusate  100 mg Per Tube BID   feeding supplement (PROSource TF20)  60 mL Per Tube BID   insulin  aspart  0-6 Units Subcutaneous Q4H   ipratropium-albuterol   3 mL Nebulization BID   lidocaine   1 patch Transdermal Q24H   multivitamin with minerals  1 tablet Per Tube Daily   mouth rinse  15 mL Mouth Rinse Q2H   pantoprazole  (PROTONIX ) IV  40 mg Intravenous Q12H   polyethylene glycol  17 g Per Tube Daily   sodium chloride  flush  3 mL Intravenous Q12H   thiamine   100 mg Per Tube Daily   Continuous Infusions:  feeding supplement (VITAL 1.5 CAL) 30 mL/hr at 07/13/23 0800   fentaNYL  infusion INTRAVENOUS 75 mcg/hr (07/13/23 0800)   heparin  10,000 units/ 20 mL infusion  syringe 500 Units/hr (07/13/23 0207)   norepinephrine  (LEVOPHED ) Adult infusion 2 mcg/min (07/13/23 0800)   piperacillin -tazobactam (ZOSYN )  IV Stopped (07/13/23 0539)   prismasol  BGK 4/2.5 400 mL/hr at 07/13/23 0354   prismasol  BGK 4/2.5 400 mL/hr at 07/13/23 0400   prismasol  BGK 4/2.5 1,500 mL/hr at 07/13/23 1610   propofol  (DIPRIVAN ) infusion 10 mcg/kg/min (07/13/23 0800)   PRN Meds:.acetaminophen  **OR** acetaminophen , albuterol , fentaNYL , heparin , heparin , hydrALAZINE , mouth rinse, polyvinyl alcohol , sodium chloride  flush, white petrolatum    Sabrina Brands,  MD 07/13/2023, 8:54 AM

## 2023-07-13 NOTE — Progress Notes (Signed)
 RT transported patient from 2M03 to MRI and back with RN. No complications. RT will continue to monitor.

## 2023-07-13 NOTE — Progress Notes (Addendum)
 eLink Physician-Brief Progress Note Patient Name: Sabrina Stein DOB: 11-14-1950 MRN: [id number]   Date of Service  07/13/2023  HPI/Events of Note  After restarting CRRT, patient has increased pressor requirements.  Daughter is in the room wondering about additional neurological testing that might help answer what is going on with her mother and may convince her father to make a decision to keep her comfortable.  eICU Interventions  No immediate action taken, daughter was asleep when I came into the room.  Will defer goals of care conversation to the primary team   0151-had a discussion with the daughter at bedside.  Discussed what comfort measures would entail and we talked about patient's husband being the medical power of attorney and being able to make that decision.  She is concerned that her encephalopathy has persisted and wants to focus more on comfort.  I walked her through the results of the various tests that were performed.  No other immediate intervention  0208 -norepinephrine  and 12 mcg, blood pressure dropping fairly rapidly.  Increase with verbal to 15->20 mcg.  Add vasopressin  and albumin  bolus.  Daughter still trying to get a hold of patient's husband in consideration of transition to comfort.  98 -had a discussion with multiple family members at bedside including the patient's husband.  We talked about what comfort measures would entail, her current clinical course, and he stated that she would likely wish to proceed with comfort measures at this time rather than pursuing continual medical therapy for supportive care.  We talked about the logistics of what ventilator withdrawal, withdrawal of vasopressors, and initiation of comfort meds looks like and he along with the rest of the family felt that it was most appropriate to proceed.  Orders were updated.  9554 -patient appears to be uncomfortable despite pushes of fentanyl .  She has a codeine allergy, start fentanyl   infusion  0539 - TOD 0528  Intervention Category Minor Interventions: Communication with other healthcare providers and/or family;Clinical assessment - ordering diagnostic tests  Ewart Carrera 07/13/2023, 11:53 PM

## 2023-07-13 NOTE — IPAL (Signed)
  Interdisciplinary Goals of Care Family Meeting   Date carried out: 07/13/2023  Location of the meeting: Phone conference  Member's involved: Physician, Bedside Registered Nurse, and Family Member or next of kin  Durable Power of Attorney or acting medical decision maker: husband    Discussion: We discussed goals of care for [employer] .  See separate note from earlier. Husband communicated he wanted to change code status to DNR with no chest compressions. I called him to confirm over the phone-- he confirmed. He still wants all other aggressive care measures.   Code status: Limited Code or DNR with short term, Mechanical ventilation, Cental IV access, IV vasoactive drugs, and IV antiarrhythmic  Disposition: Continue current acute care   Time spent for the meeting: 5 min.  Joesph Mussel 07/13/2023, 6:43 PM

## 2023-07-13 NOTE — Progress Notes (Signed)
 CT head: no acute changes; chronic microvascular changes Brain MRI: no acute findings, chronic microvascular changes RUQ US : pending CT abd: no SBO EEG: no seizures  Still not waking up, but remains on low dose propofol  and fentanyl , which have now been stopped.  CRRT has been off since this morning.  Family updated at bedside, including daughter and husband. We reviewed that strokes have been ruled out and we are most likely dealing with a metabolic cause for her mental status currently. Starting her back on CRRT and holding sedation should help. We can review other meds that can cause encephalopathy.   Her family asked questions about prognosis and what to expect after this. Since she remains so acutely ill 1.5 weeks into her illness it is hard to say, but we can be sure we are looking at weeks to months of total recovery, up to about a year. Her husband related that they have discussed her wishes before, and he alluded that she would may not want such aggressive care, but that family has talked about this leading up to this point in her hospitalization. I encouraged them to discuss her code status, because a this point a code would be a very significant setback for her.   RN present during discussion.  Joesph Mussel, DO 07/13/23 4:55 PM [facility]  For contact information, see Amion. If no response to pager, please call PCCM consult pager. After hours, 7PM- 7AM, please call Elink.

## 2023-07-13 NOTE — Progress Notes (Signed)
 Nutrition Follow-up  DOCUMENTATION CODES:  Severe malnutrition in context of acute illness/injury  INTERVENTION:  Continue tube feeding via NGT, replace with post-pyloric cortrak prior to extubation: Vital 1.5 at 40 ml/h (960 ml per day) Prosource TF20 60 ml BID Provides 1600 kcal, 105 gm protein, 733 ml free water  daily Continue thiamine  through the 7 day duration MVI with minerals daily  NUTRITION DIAGNOSIS:  Severe Malnutrition related to acute illness as evidenced by energy intake < or equal to 50% for > or equal to 1 month, moderate muscle depletion. - remains applicable  GOAL:  Patient will meet greater than or equal to 90% of their needs - progressing  MONITOR:  Diet advancement, Labs, I & O's, Weight trends  REASON FOR ASSESSMENT:  Consult Enteral/tube feeding initiation and management, Assessment of nutrition requirement/status  ASSESSMENT:  73 yo female admitted to [facility] 4/7 with multifocal PNA. Transferred to [facility] 4/11 with hypoxic respiratory failure requiring intubation. PMH includes HLD, depression, GERD, anxiety, arthritis.  4/7 - presented to [facility] ED 4/10 - BSE, regular diet/thin 4/11 - transfer to [facility] ICU, intubated 4/12 - TF initiated via OGT 4/13 - extubated 4/15 - reintubated  4/16 - enteral nutrition started  Pt resting in bed at the time of assessment. Remains intubated on vent support but sedation turned off this AM. Remains on CRRT at this time and fluid still being removed. Son and daughter at bedside. Discussed TF and nutrition plan. Discussed the post-pyloric feeds being tolerated and discussed the possibility of exchanging NGT for cortrak. Family and MD in agreement.   RN reports TF have been well tolerated. Having stool output and no apparent abdominal distention.   Discussed in rounds, pt may need a little longer on ventilator until additional fluid removal can be achieved.   MV: 12.6 L/min Temp (24hrs), Avg:98.2 F (36.8 C),  Min:97.8 F (36.6 C), Max:98.9 F (37.2 C) MAP (cuff):  Admit weight: 83.3 kg ? Accuracy 4/9 Weight (likely first measured): 79.6 kg  Current weight: 81.3kg UBW (per pt): 189 lb  6.7% weight loss noted over the last 2 months (2/18-4/13) which is significant. Has likely lost weight this admission but initial weight was copied forward.    Intake/Output Summary (Last 24 hours) at 07/13/2023 1417 Last data filed at 07/13/2023 1200 Gross per 24 hour  Intake 1378.06 ml  Output 4088 ml  Net -2709.94 ml  Net IO Since Admission: -222.75 mL [07/13/23 1417]  Drains/Lines: Temporary HD Catheter, Right IJ CVC Triple Lumen, left IJ NGT, proximal duodenum   UOP 5mL x 24 hours  Nutritionally Relevant Medications: Scheduled Meds:  docusate  100 mg Per Tube BID   PROSource TF20  60 mL Per Tube BID   insulin  aspart  0-6 Units Subcutaneous Q4H   multivitamin with minerals  1 tablet Per Tube Daily   pantoprazole  IV  40 mg Intravenous Q12H   polyethylene glycol  17 g Per Tube Daily   thiamine   100 mg Per Tube Daily   Continuous Infusions:  feeding supplement (VITAL 1.5 CAL) 30 mL/hr at 07/13/23 1000   piperacillin -tazobactam (ZOSYN )  IV Stopped (07/13/23 0539)   Labs Reviewed: Chloride 97 BUN 33, creatinine 1.4 Magnesium 2.9 CBG ranges from 138-194 mg/dL over the last 24 hours HgbA1c 5.8% (4/7)  NUTRITION - FOCUSED PHYSICAL EXAM: Flowsheet Row Most Recent Value  Orbital Region Mild depletion  Upper Arm Region No depletion  Thoracic and Lumbar Region No depletion  Buccal Region No depletion  Temple Region Mild depletion  Clavicle Bone Region Mild depletion  Clavicle and Acromion Bone Region Mild depletion  Scapular Bone Region No depletion  Dorsal Hand No depletion  Patellar Region Moderate depletion  Anterior Thigh Region Moderate depletion  Posterior Calf Region Moderate depletion  Edema (RD Assessment) Mild  Hair Reviewed  Eyes Reviewed  Mouth Reviewed  Skin Reviewed   Nails Reviewed   Diet Order:   Diet Order             Diet NPO time specified  Diet effective now                   EDUCATION NEEDS:  Education needs have been addressed  Skin:  Skin Assessment: Skin Integrity Issues: Skin Integrity Issues:: Stage III Stage III: coccyx (1x1 cm)  Last BM:  4/18 - type 7  Height:  Ht Readings from Last 1 Encounters:  07/11/23 5\' 2"  (1.575 m)    Weight:  Wt Readings from Last 1 Encounters:  07/12/23 81.3 kg    Ideal Body Weight:  50 kg  BMI:  Body mass index is 32.78 kg/m.  Estimated Nutritional Needs:  Kcal:  1600-1800 kcal/d Protein:  100-125 g/d Fluid:  1.6-1.8L/d    Edwena Graham, RD, LDN Registered Dietitian II Please reach out via secure chat

## 2023-07-13 NOTE — Progress Notes (Signed)
 While transporting patient to CT, pt vomited yellow fluids while HOB 30 and TF held. Pt did not desat during this event. Pt NGT hooked to low wall suction. 200 ml out. No output from ETT suctioning. Dr. Fulton Job notified. CT abd/pelvis obtained.

## 2023-07-13 NOTE — Procedures (Signed)
 Patient Name: Sabrina Stein  MRN: [id number]  Epilepsy Attending: Arlin MALVA Krebs  Referring Physician/Provider: Gretta Leita SQUIBB, DO  Date: 07/13/2023 Duration: 22.41 mins  Patient history: 72yo F with ams. EEG to evaluate for seizure  Level of alertness: comatose/ lethargic   AEDs during EEG study: propofol   Technical aspects: This EEG study was done with scalp electrodes positioned according to the 10-20 International system of electrode placement. Electrical activity was reviewed with band pass filter of 1-70Hz , sensitivity of 7 uV/mm, display speed of 36mm/sec with a 60Hz  notched filter applied as appropriate. EEG data were recorded continuously and digitally stored.  Video monitoring was available and reviewed as appropriate.  Description: EEG showed continuous generalized 3 to 5 Hz theta-delta slowing. Generalized periodic discharges with triphasic morphology at 1-1.5 Hz were also noted. Hyperventilation and photic stimulation were not performed.     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 No seizures or definite epileptiform discharges were seen throughout the recording.  [name] O Talma Aguillard

## 2023-07-13 NOTE — Progress Notes (Signed)
 RT transported patient from 2M03 to CT and back with RN. No complications. RT will continue to monitor.

## 2023-07-13 NOTE — Progress Notes (Signed)
Routine EEG complete. Results pending.

## 2023-07-13 NOTE — Progress Notes (Signed)
 NAME:  Sabrina Stein, MRN:  [id number], DOB:  04-11-1950, LOS: 11 ADMISSION DATE:  07/02/2023, CONSULTATION DATE:  4/11 REFERRING MD:  EDP, CHIEF COMPLAINT:  Respiratory Failure   History of Present Illness:  Sabrina Stein is a 73 y/o woman .GERD, Depression/anxiety, former tobacco use presented [facility] ED w/ hypoxic respiratory failure.Infectious work up showed colitis and was started on antibiotic. She however continued to worsen in respiratory status, placed on heated high flow and required intubation and transferred her for respiratory support.She was extubated on 4/13 but her hospital course got complicated by worsening hypoxemia and renal failure and got re-intubated on the 4/15 and started on CRRT.   Pertinent  Medical History   Past Medical History:  Diagnosis Date   Anxiety    Arthritis    Depression    GERD (gastroesophageal reflux disease)    Hyperlipidemia      Significant Hospital Events: Including procedures, antibiotic start and stop dates in addition to other pertinent events   4/7 admitted to [facility] 4/11 worsening hypoxia requiring intubation; transferred to [facility]; PCCM consulted 4/12 Improved FIO2 and pressor requirement 4/13 Extubated 4/14 Worsening hypoxemia and renal failure overnight 4/15 Intubated for increased O2 requirement and started on dialysis for suspected volume overload in setting of renal failure 4/16 Tolerating CRRT  Interim History / Subjective:  Minimally responsive this morning. Weaning off sedatives and pressors    Objective   Blood pressure (!) 94/57, pulse 97, temperature 98.1 F (36.7 C), temperature source Axillary, resp. rate (!) 34, height 5\' 2"  (1.575 m), weight 81.3 kg, SpO2 96%.    Vent Mode: PRVC FiO2 (%):  [40 %-50 %] 40 % Set Rate:  [28 bmp] 28 bmp Vt Set:  [400 mL] 400 mL PEEP:  [8 cmH20-10 cmH20] 8 cmH20 Plateau Pressure:  [25 cmH20] 25 cmH20   Intake/Output Summary (Last 24 hours) at 07/13/2023 0865 Last data filed at 07/13/2023  0800 Gross per 24 hour  Intake 1546.02 ml  Output 4967 ml  Net -3420.98 ml   Filed Weights   07/08/23 0316 07/10/23 0500 07/12/23 0500  Weight: 80.5 kg 83.5 kg 81.3 kg    Examination: General: Ill appearing , laying in bed , on Vent  HENT: [place], AT,ETT in place  Lungs: Crackles improved Cardiovascular: RRR , -M/R/G,no JVD  Abdomen: Soft , Non tender  Extremities: No LLE  Neuro: Sedated  GU: Foley in place   Resolved Hospital Problem list   None   Assessment & Plan:  #Acute respiratory failure with hypoxia 2/2 pneumonia and pulmonary edema Multifocal pneumonia:  - Vent support. -  SBT and extubate if possible  - VAP/PAD protocol in place.  - Finished Abx yesterday  #Septic shock +/- sedation related, dialysis related hypotension  - Back on Levo 4 now. - WBC up to 28.4 from 23. - Will f/u on blood cx   #Acute anemia  - Hgb 8.5 trend CBC  - Continue PPI BID  #AKI on CRRT - Continue with CRRT and UF as tolerated - No urine output   #Back pain - Lidoderm  patch   #HTN #HLD Plan: -PRN hydralazine  -statin   Elevated LFTs Plan: -improving -cont to trend   Anxiety/depression Plan: -cont effexor  -hold xanax    Best Practice (right click and "Reselect all SmartList Selections" daily)   Diet/type: NPO DVT prophylaxis: SCD GI prophylaxis: N/A Lines: Dialysis Catheter Foley:  Yes, and it is still needed Code Status:  full code Last date of multidisciplinary goals of care  discussion [07/12/23]  Labs   CBC: Recent Labs  Lab 07/07/23 0357 07/08/23 0816 07/09/23 0849 07/10/23 0228 07/10/23 1528 07/10/23 2351 07/11/23 0301 07/12/23 0500 07/13/23 0510  WBC 17.5*   < > 12.1* 14.6*  --   --  21.3* 23.7* 28.4*  NEUTROABS 14.9*  --   --   --   --   --   --   --   --   HGB 8.3*   < > 8.1* 8.3* 8.5* 10.5* 8.0* 7.5* 8.5*  HCT 26.1*   < > 25.0* 25.6* 25.0* 31.0* 24.2* 22.8* 26.9*  MCV 94.2   < > 89.3 89.5  --   --  87.4 88.0 91.2  PLT 445*   < > 301 303  --    --  393 425* 581*   < > = values in this interval not displayed.    Basic Metabolic Panel: Recent Labs  Lab 07/11/23 0301 07/11/23 0840 07/11/23 1803 07/12/23 0500 07/12/23 1607 07/13/23 0510  NA 139  --  136 137 138 137  K 4.9  --  4.1 4.3 4.4 4.8  CL 99  --  96* 102 100 97*  CO2 16*  --  22 24 25 25   GLUCOSE 139*  --  119* 133* 149* 158*  BUN 152*  --  77* 50* 42* 33*  CREATININE 5.48*  --  2.94* 1.92* 1.46* 1.40*  CALCIUM  8.9  --  8.5* 8.6* 8.5* 9.1  MG  --  2.0 2.3 2.6* 2.8* 2.9*  PHOS 11.9* 5.9* 6.0*  6.0* 4.1 3.1  3.1 3.2   GFR: Estimated Creatinine Clearance: 35.9 mL/min (A) (by C-G formula based on SCr of 1.4 mg/dL (H)). Recent Labs  Lab 07/07/23 0357 07/08/23 0816 07/10/23 0228 07/11/23 0301 07/11/23 0840 07/11/23 1122 07/12/23 0500 07/13/23 0510  PROCALCITON 2.34  --   --   --   --   --   --   --   WBC 17.5*   < > 14.6* 21.3*  --   --  23.7* 28.4*  LATICACIDVEN  --   --   --   --  1.6 1.2  --   --    < > = values in this interval not displayed.    Liver Function Tests: Recent Labs  Lab 07/07/23 0357 07/08/23 0816 07/09/23 0849 07/11/23 0301 07/11/23 0840 07/11/23 1803 07/12/23 0500 07/12/23 1607 07/13/23 0510  AST 55* 31 37  --  27  --   --   --  34  ALT 43 31 28  --  20  --   --   --  20  ALKPHOS 476* 297* 306*  --  206*  --   --   --  241*  BILITOT 2.4* 2.7* 2.1*  --  2.5*  --   --   --  1.7*  PROT 6.8 6.8 6.5  --  6.3*  --   --   --  8.1  ALBUMIN  1.8* 1.7* 1.8*   < > 1.8* 1.7* 1.8* 1.8* 2.1*  2.1*   < > = values in this interval not displayed.   No results for input(s): "LIPASE", "AMYLASE" in the last 168 hours. No results for input(s): "AMMONIA" in the last 168 hours.  ABG    Component Value Date/Time   PHART 7.36 07/11/2023 0454   PCO2ART 34 07/11/2023 0454   PO2ART 96 07/11/2023 0454   HCO3 19.3 (L) 07/11/2023 0454   TCO2 19 (L) 07/10/2023 2351  ACIDBASEDEF 5.6 (H) 07/11/2023 0454   O2SAT 98.6 07/11/2023 0454      Coagulation Profile: No results for input(s): "INR", "PROTIME" in the last 168 hours.  Cardiac Enzymes: No results for input(s): "CKTOTAL", "CKMB", "CKMBINDEX", "TROPONINI" in the last 168 hours.  HbA1C: HB A1C (BAYER DCA - WAIVED)  Date/Time Value Ref Range Status  07/31/2022 10:42 AM 5.7 (H) 4.8 - 5.6 % Final    Comment:             Prediabetes: 5.7 - 6.4          Diabetes: >6.4          Glycemic control for adults with diabetes: <7.0   01/30/2022 04:14 PM 5.8 (H) 4.8 - 5.6 % Final    Comment:             Prediabetes: 5.7 - 6.4          Diabetes: >6.4          Glycemic control for adults with diabetes: <7.0    Hgb A1c MFr Bld  Date/Time Value Ref Range Status  07/02/2023 02:36 PM 5.8 (H) 4.8 - 5.6 % Final    Comment:    (NOTE) Pre diabetes:          5.7%-6.4%  Diabetes:              >6.4%  Glycemic control for   <7.0% adults with diabetes     CBG: Recent Labs  Lab 07/12/23 1553 07/12/23 1916 07/12/23 2251 07/13/23 0341 07/13/23 0742  GLUCAP 146* 138* 140* 163* 174*    Review of Systems:   Unable to assess   Past Medical History:  She,  has a past medical history of Anxiety, Arthritis, Depression, GERD (gastroesophageal reflux disease), and Hyperlipidemia.   Surgical History:   Past Surgical History:  Procedure Laterality Date   APPENDECTOMY     CATARACT EXTRACTION Bilateral    CESAREAN SECTION     x2   COLONOSCOPY N/A 06/22/2023   Procedure: COLONOSCOPY;  Surgeon: Vinetta Greening, DO;  Location: [facility];  Service: Endoscopy;  Laterality: N/A;  130PM, OK RM 1     Social History:   reports that she quit smoking about 11 years ago. Her smoking use included cigarettes. She started smoking about 54 years ago. She has a 43 pack-year smoking history. She has never used smokeless tobacco. She reports current alcohol  use. She reports current drug use. Drug: Marijuana.   Family History:  Her family history includes Cancer in her father; Healthy  in her daughter, sister, and son.   Allergies Allergies  Allergen Reactions   Codeine Nausea And Vomiting     Home Medications  Prior to Admission medications   Medication Sig Start Date End Date Taking? Authorizing Provider  acetaminophen  (TYLENOL ) 500 MG tablet Take 1,000 mg by mouth every 6 (six) hours as needed for mild pain (pain score 1-3).   Yes [provider]  ALPRAZolam  (XANAX ) 0.25 MG tablet Take 1 tablet (0.25 mg total) by mouth at bedtime as needed for anxiety. 01/09/23  Yes Hawks, Christy A, FNP  Ascorbic Acid (VITAMIN C) 1000 MG tablet Take 1,000 mg by mouth daily.   Yes [provider]  atorvastatin  (LIPITOR) 20 MG tablet TAKE 1 TABLET BY MOUTH EVERY DAY 01/17/23  Yes Hawks, [place] A, FNP  Calcium  Carbonate-Vit D-Min (CALCIUM  1200 PO) Take 1 tablet by mouth daily.   Yes [provider]  Cholecalciferol (VITAMIN D3) 1.25  MG (50000 UT) CAPS Take 1 capsule by mouth daily.   Yes [provider]  diclofenac  (VOLTAREN ) 75 MG EC tablet Take 1 tablet (75 mg total) by mouth 2 (two) times daily. 01/09/23  Yes Hawks, Christy A, FNP  dicyclomine  (BENTYL ) 20 MG tablet Take 1 tablet (20 mg total) by mouth 2 (two) times daily. 06/27/23 06/26/24 Yes Carver, Rolando Cliche, DO  mirabegron  ER (MYRBETRIQ ) 50 MG TB24 tablet Take 1 tablet (50 mg total) by mouth daily. 07/31/22  Yes Hawks, Christy A, FNP  omeprazole  (PRILOSEC) 40 MG capsule Take 1 capsule (40 mg total) by mouth daily. 05/15/23  Yes [facility] Verm[facility] Gobble, Adell Hones, NP  ondansetron  (ZOFRAN ) 4 MG tablet Take 1 tablet (4 mg total) by mouth every 8 (eight) hours as needed for nausea. Patient taking differently: Take 4 mg by mouth every 8 (eight) hours as needed for nausea or vomiting. 06/18/23  Yes Hawks, Christy A, FNP  tolterodine  (DETROL  LA) 4 MG 24 hr capsule Take 1 capsule (4 mg total) by mouth daily. 09/25/22  Yes Christina Coyer, MD  venlafaxine  XR (EFFEXOR -XR) 75 MG 24 hr capsule Take 1 capsule (75 mg total) by  mouth daily. 07/31/22  Yes Hawks, Christy A, FNP  zinc gluconate 50 MG tablet Take 50 mg by mouth daily.   Yes [provider]  polyethylene glycol-electrolytes (NULYTELY ) 420 g solution Take 4,000 mLs by mouth once. 06/18/23   [provider]     Critical care time: 30 minutes       Fay Hoop MD [facility] Internal Medicine Program - PGY-1 07/13/2023, 8:23 AM Pager# [phone]

## 2023-07-14 LAB — CBC
HCT: 28.7 % — ABNORMAL LOW (ref 36.0–46.0)
Hemoglobin: 8.6 g/dL — ABNORMAL LOW (ref 12.0–15.0)
MCH: 28.9 pg (ref 26.0–34.0)
MCHC: 30 g/dL (ref 30.0–36.0)
MCV: 96.3 fL (ref 80.0–100.0)
Platelets: 649 10*3/uL — ABNORMAL HIGH (ref 150–400)
RBC: 2.98 MIL/uL — ABNORMAL LOW (ref 3.87–5.11)
RDW: 17.9 % — ABNORMAL HIGH (ref 11.5–15.5)
WBC: 35.7 10*3/uL — ABNORMAL HIGH (ref 4.0–10.5)
nRBC: 1.6 % — ABNORMAL HIGH (ref 0.0–0.2)

## 2023-07-14 LAB — RENAL FUNCTION PANEL
Albumin: 2.2 g/dL — ABNORMAL LOW (ref 3.5–5.0)
Anion gap: 19 — ABNORMAL HIGH (ref 5–15)
BUN: 39 mg/dL — ABNORMAL HIGH (ref 8–23)
CO2: 21 mmol/L — ABNORMAL LOW (ref 22–32)
Calcium: 9.6 mg/dL (ref 8.9–10.3)
Chloride: 99 mmol/L (ref 98–111)
Creatinine, Ser: 1.66 mg/dL — ABNORMAL HIGH (ref 0.44–1.00)
GFR, Estimated: 33 mL/min — ABNORMAL LOW (ref >60)
Glucose, Bld: 126 mg/dL — ABNORMAL HIGH (ref 70–99)
Phosphorus: 6.4 mg/dL — ABNORMAL HIGH (ref 2.5–4.6)
Potassium: 6 mmol/L — ABNORMAL HIGH (ref 3.5–5.1)
Sodium: 139 mmol/L (ref 135–145)

## 2023-07-14 LAB — MAGNESIUM: Magnesium: 3.3 mg/dL — ABNORMAL HIGH (ref 1.7–2.4)

## 2023-07-14 LAB — APTT: aPTT: 36 s (ref 24–36)

## 2023-07-14 LAB — TRIGLYCERIDES: Triglycerides: 168 mg/dL — ABNORMAL HIGH (ref <150)

## 2023-07-14 MED ORDER — VASOPRESSIN 20 UNITS/100 ML INFUSION FOR SHOCK: 0.0400 [IU]/min | INTRAVENOUS | Status: DC

## 2023-07-14 MED ORDER — GLYCOPYRROLATE 0.2 MG/ML IJ SOLN
0.2000 mg | INTRAMUSCULAR | Status: DC | PRN
  Administered 2023-07-14: 0.2 mg via INTRAVENOUS

## 2023-07-14 MED ORDER — ACETAMINOPHEN 650 MG RE SUPP: 650.0000 mg | Freq: Four times a day (QID) | RECTAL | Status: DC | PRN

## 2023-07-14 MED ORDER — FENTANYL CITRATE PF 50 MCG/ML IJ SOSY
25.0000 ug | PREFILLED_SYRINGE | INTRAMUSCULAR | Status: DC | PRN
  Administered 2023-07-14: 100 ug via INTRAVENOUS
  Filled 2023-07-14: qty 2

## 2023-07-14 MED ORDER — LACTATED RINGERS IV BOLUS: 500.0000 mL | Freq: Once | INTRAVENOUS | Status: DC

## 2023-07-14 MED ORDER — HALOPERIDOL LACTATE 5 MG/ML IJ SOLN
2.5000 mg | INTRAMUSCULAR | Status: DC | PRN
  Administered 2023-07-14: 5 mg via INTRAVENOUS
  Filled 2023-07-14: qty 1

## 2023-07-14 MED ORDER — GLYCOPYRROLATE 0.2 MG/ML IJ SOLN
0.2000 mg | INTRAMUSCULAR | Status: DC | PRN
  Filled 2023-07-14: qty 1

## 2023-07-14 MED ORDER — FENTANYL BOLUS VIA INFUSION: 100.0000 ug | INTRAVENOUS | Status: DC | PRN

## 2023-07-14 MED ORDER — GLYCOPYRROLATE 1 MG PO TABS: 1.0000 mg | ORAL_TABLET | ORAL | Status: DC | PRN

## 2023-07-14 MED ORDER — ACETAMINOPHEN 325 MG PO TABS: 650.0000 mg | ORAL_TABLET | Freq: Four times a day (QID) | ORAL | Status: DC | PRN

## 2023-07-14 MED ORDER — ALBUMIN HUMAN 25 % IV SOLN
25.0000 g | Freq: Once | INTRAVENOUS | Status: AC
  Administered 2023-07-14: 25 g via INTRAVENOUS
  Filled 2023-07-14: qty 100

## 2023-07-14 MED ORDER — LORAZEPAM 2 MG/ML IJ SOLN
2.0000 mg | INTRAMUSCULAR | Status: DC | PRN
  Administered 2023-07-14: 4 mg via INTRAVENOUS
  Filled 2023-07-14: qty 2

## 2023-07-14 MED ORDER — FENTANYL 2500MCG IN NS 250ML (10MCG/ML) PREMIX INFUSION: 0.0000 ug/h | INTRAVENOUS | Status: DC

## 2023-07-14 MED ORDER — ONDANSETRON HCL 4 MG/2ML IJ SOLN: 4.0000 mg | Freq: Four times a day (QID) | INTRAMUSCULAR | Status: DC | PRN

## 2023-07-14 MED ORDER — SODIUM CHLORIDE 0.9 % IV SOLN: INTRAVENOUS | Status: DC

## 2023-07-14 MED ORDER — ONDANSETRON 4 MG PO TBDP: 4.0000 mg | ORAL_TABLET | Freq: Four times a day (QID) | ORAL | Status: DC | PRN

## 2023-07-16 LAB — CULTURE, BLOOD (ROUTINE X 2)
Culture: NO GROWTH
Culture: NO GROWTH
Special Requests: ADEQUATE
Special Requests: ADEQUATE

## 2023-07-26 NOTE — Progress Notes (Addendum)
 Pt extubated to comfort with RN and family at bedside. Pt placed on 2L [place].

## 2023-07-26 NOTE — Progress Notes (Signed)
   08/11/2023 0330  Spiritual Encounters  Type of Visit Initial  Care provided to: Pt and family  Conversation partners present during encounter Nurse  Referral source Family  Reason for visit End-of-life  OnCall Visit Yes  Interventions  Spiritual Care Interventions Made Established relationship of care and support;Compassionate presence;Reflective listening;Narrative/life review;Explored values/beliefs/practices/strengths;Bereavement/grief support;Prayer    Chaplain responded to family request for chaplain presence while transitioning pt into comfort care. Chaplain remains available.

## 2023-07-26 NOTE — Death Summary Note (Signed)
 DEATH SUMMARY   Patient Details  Name: Sabrina Stein MRN: [id number] DOB: 09-07-1950  Admission/Discharge Information   Admit Date:  Jul 28, 2023  Date of Death: Date of Death: Aug 09, 2023  Time of Death: Time of Death: 0528  Length of Stay: Aug 02, 2023  Referring Physician: Yevette Hem, FNP   Reason(s) for Hospitalization  Multilobar pneumonia  Diagnoses  Preliminary cause of death:  Secondary Diagnoses (including complications and co-morbidities):  Principal Problem:   Acute anemia Active Problems:   Obesity (BMI 30-39.9)   Gastroesophageal reflux disease without esophagitis   AKI (acute kidney injury) (HCC)   Colitis   Acute hypoxemic respiratory failure (HCC)   Essential hypertension   Acute respiratory failure with hypoxemia (HCC)   Protein-calorie malnutrition, severe   On mechanically assisted ventilation (HCC)   Acute respiratory failure with hypoxia (HCC)   Pressure injury of skin Ileus Septic shock due to pneumonia Acute metabolic encephalopathy Hyperglycemia Hyperbilirubinemia Acute pulmonary edema Aspiration pneumonitis H/o HTN H/o HLD Hyperkalemia Hyperglycemia  Brief Hospital Course (including significant findings, care, treatment, and services provided and events leading to death)  Sabrina Stein is a 73 y.o. year old female with a history of GERD, Depression/anxiety, former tobacco use who presented [facility] ED w/ hypoxic respiratory failure.   Patient underwent colonoscopy on 3/28 showing colitis. Started empirically on cipro /flagyl . Patient having persistent weakness, N/V, and abd pain, and now with sob.  Denies bloody stools or fever.  Came to [facility] on 07-28-23 for further eval. On arrival patient hypothermic 94.5 F with soft BP.  WBC 15.8.  Cultures obtained, given IV fluids, started on broad-spectrum antibiotics.  Patient also with hypoxia requiring nasal cannula.  CXR concerning for multifocal pneumonia.  Creat elevated 3.09 and elevated LFTs.  Hemoglobin down to 6 and  Hemoccult negative.  Transfuse PRBC.  Started on Protonix .  GI consulted.  Also with elevated BNP and troponins.  Cardiology consulted suspect demand ischemia.  Echo showing EF 55 to 70%; grade 1 diastolic.  CT chest, abdomen, pelvis showing multifocal pneumonia.  Patient admitted to floors.   Throughout stay patient with increasing O2 requirements.  On 4/11 on heated high flow 40L, 80%.  Started on IV steroids and given Lasix .  On Unasyn /doxy.  Patient looking more tired and decision made to proceed with intubation.  PCCM consulted for transfer, will admit to [facility].  After transfer to [facility] she remained on MV and continued to have high oxygen requirements and inability to wean from MV. She had multiple vomiting episodes concerning for aspiration and likely an ileus. Despite aggressive pulmonary support, she did not make progress with weaning from MV. She had a trial of extubation that she failed within one day, requiring reintubation. She developed AKI requiring CRRT for electrolyte and volume management. She developed significant metabolic encephalopathy without an identified cause. Workup included negative MRI & EEG. When she required rapidly increasing vasopressor doses to tolerate restarting CRRT after being off for several hours to facilitate her neuro workup. After discussions about her ongoing care and uncertain prognosis during the day, her family elected to withdraw aggressive care support overnight. She passed away with family at bedside.   Pertinent Labs and Studies  Significant Diagnostic Studies US  Abdomen Limited RUQ (LIVER/GB) Result Date: 07/13/2023 CLINICAL DATA:  Hyperbilirubinemia EXAM: ULTRASOUND ABDOMEN LIMITED RIGHT UPPER QUADRANT COMPARISON:  Ultrasound 08/17/2022.  CT 07/13/2023. FINDINGS: Gallbladder: Dilated gallbladder with some sludge. No wall thickening or adjacent fluid. Common bile duct: Diameter: 6 mm Liver: No focal lesion identified. Within  normal limits in parenchymal  echogenicity. Portal vein is patent on color Doppler imaging with normal direction of blood flow towards the liver. Other: None. IMPRESSION: Distended gallbladder with some sludge. No shadowing stones or ductal dilatation. Electronically Signed   By: Adrianna Horde M.D.   On: 07/13/2023 18:16   MR BRAIN WO CONTRAST Result Date: 07/13/2023 CLINICAL DATA:  Acute encephalopathy. EXAM: MRI HEAD WITHOUT CONTRAST TECHNIQUE: Multiplanar, multiecho pulse sequences of the brain and surrounding structures were obtained without intravenous contrast. COMPARISON:  Head CT 07/13/2023. FINDINGS: Brain: No acute infarct or hemorrhage. Background of mild chronic small-vessel disease. No mass or midline shift. No hydrocephalus or extra-axial collection. No abnormal susceptibility. Vascular: Normal flow voids. Skull and upper cervical spine: Normal marrow signal. Sinuses/Orbits: Negative. Other: None. IMPRESSION: 1. No acute intracranial process. 2. Mild chronic small-vessel disease. Electronically Signed   By: Audra Blend M.D.   On: 07/13/2023 16:26   EEG adult Result Date: 07/13/2023 Arleene Lack, MD     07/13/2023  2:00 PM Patient Name: Sabrina Stein MRN: [id number] Epilepsy Attending: Arleene Lack Referring Physician/Provider: Joesph Mussel, DO Date: 07/13/2023 Duration: 22.41 mins Patient history: 72yo F with ams. EEG to evaluate for seizure Level of alertness: comatose/ lethargic AEDs during EEG study: propofol  Technical aspects: This EEG study was done with scalp electrodes positioned according to the 10-20 International system of electrode placement. Electrical activity was reviewed with band pass filter of 1-70Hz , sensitivity of 7 uV/mm, display speed of 57mm/sec with a 60Hz  notched filter applied as appropriate. EEG data were recorded continuously and digitally stored.  Video monitoring was available and reviewed as appropriate. Description: EEG showed continuous generalized 3 to 5 Hz theta-delta slowing.  Generalized periodic discharges with triphasic morphology at 1-1.5 Hz were also noted. Hyperventilation and photic stimulation were not performed.   ABNORMALITY - Periodic discharges with triphasic morphology, generalized ( GPDs) - Continuous slow, generalized IMPRESSION: This study is suggestive of moderate to severe diffuse encephalopathy likely related to toxic-metabolic causes. No seizures or definite epileptiform discharges were seen throughout the recording. Arleene Lack   CT ABDOMEN PELVIS WO CONTRAST Result Date: 07/13/2023 CLINICAL DATA:  Bilious vomiting. EXAM: CT ABDOMEN AND PELVIS WITHOUT CONTRAST TECHNIQUE: Multidetector CT imaging of the abdomen and pelvis was performed following the standard protocol without IV contrast. RADIATION DOSE REDUCTION: This exam was performed according to the departmental dose-optimization program which includes automated exposure control, adjustment of the mA and/or kV according to patient size and/or use of iterative reconstruction technique. COMPARISON:  07/02/2023 FINDINGS: Note: Markedly motion degraded study. Lower chest: Interval progression of patchy ground-glass and confluent airspace disease in the lower lungs bilaterally. No pleural effusion. Hepatobiliary: No suspicious focal abnormality in the liver on this study without intravenous contrast. Gallbladder is distended with layering high density material, likely sludge. No intrahepatic or extrahepatic biliary dilation. Pancreas: No focal mass lesion. No dilatation of the main duct. No intraparenchymal cyst. No peripancreatic edema. Spleen: No splenomegaly. No suspicious focal mass lesion. Adrenals/Urinary Tract: No adrenal nodule or mass. Right kidney unremarkable. Exophytic low-density lesion upper pole right kidney is similar to prior, likely a cyst. No evidence for hydroureter. Bladder is decompressed by Foley catheter. Stomach/Bowel: NG tube tip is transpyloric, positioned in the proximal duodenum. No  small bowel wall thickening. No small bowel dilatation. The terminal ileum is normal. The appendix is not well visualized, but there is no edema or inflammation in the region of the cecal tip to suggest appendicitis.  Colon is decompressed. Vascular/Lymphatic: There is moderate atherosclerotic calcification of the abdominal aorta without aneurysm. There is no gastrohepatic or hepatoduodenal ligament lymphadenopathy. No retroperitoneal or mesenteric lymphadenopathy. No pelvic sidewall lymphadenopathy. Reproductive: Unremarkable. Other: No intraperitoneal free fluid. Musculoskeletal: No worrisome lytic or sclerotic osseous abnormality. IMPRESSION: 1. Markedly motion degraded study. 2. Interval progression of patchy ground-glass and confluent airspace disease in the lower lungs bilaterally. Imaging features compatible with multifocal pneumonia. 3. Gallbladder is distended with layering high density material, likely sludge. Right upper quadrant ultrasound could be used to further evaluate as clinically warranted. 4. NG tube tip is transpyloric, positioned in the proximal duodenum. No evidence for small bowel or colonic dilatation. 5.  Aortic Atherosclerois (ICD10-170.0) Electronically Signed   By: Donnal Fusi M.D.   On: 07/13/2023 12:36   CT HEAD WO CONTRAST ( ) Result Date: 07/13/2023 CLINICAL DATA:  Encephalopathy. EXAM: CT HEAD WITHOUT CONTRAST TECHNIQUE: Contiguous axial images were obtained from the base of the skull through the vertex without intravenous contrast. RADIATION DOSE REDUCTION: This exam was performed according to the departmental dose-optimization program which includes automated exposure control, adjustment of the mA and/or kV according to patient size and/or use of iterative reconstruction technique. COMPARISON:  None Available. FINDINGS: Brain: No acute hemorrhage. Cortical gray-white differentiation is preserved. Patchy hypoattenuation of the cerebral white matter, most consistent with mild  chronic small-vessel disease. Prominence of the ventricles and sulci within normal limits for age. No extra-axial collection. Basilar cisterns are patent. Vascular: No hyperdense vessel or unexpected calcification. Skull: No calvarial fracture or suspicious bone lesion. Skull base is unremarkable. Sinuses/Orbits: Partially imaged nasogastric tube. Left-greater-than-right mastoid and middle ear opacification. Orbits are unremarkable. Other: None. IMPRESSION: 1. No acute intracranial abnormality. 2. Mild chronic small-vessel disease. Electronically Signed   By: Audra Blend M.D.   On: 07/13/2023 12:04   DG Chest Port 1 View Result Date: 07/11/2023 CLINICAL DATA:  Central line placement. EXAM: PORTABLE CHEST 1 VIEW COMPARISON:  07/10/2023. FINDINGS: Interval placement of left IJ CVC catheter with the tip overlying the lower SVC. Stable right-sided CVC catheter tip overlies the upper SVC. Endotracheal tube tip is 4.6 cm above the carina, unchanged. Enteric tube courses below the left hemidiaphragm, beyond the field of view. Persistent diffuse bilateral airspace opacities, not significantly changed. No sizable pleural effusion or pneumothorax. Visualized osseous structures are unchanged. IMPRESSION: 1. Left IJ CVC catheter tip overlies the lower SVC. 2. Persistent diffuse bilateral airspace opacities. Electronically Signed   By: Mannie Seek M.D.   On: 07/11/2023 11:58   DG Abd Portable 1V Result Date: 07/10/2023 CLINICAL DATA:  NGT placement. EXAM: PORTABLE ABDOMEN - 1 VIEW COMPARISON:  07/09/2023. FINDINGS: Limited image including upper and demonstrates NG tube with the tip superimposed with the gastric antrum or duodenum in the right mid abdomen. IMPRESSION: NG tube as above. Electronically Signed   By: Sydell Eva M.D.   On: 07/10/2023 20:36   DG CHEST PORT 1 VIEW Result Date: 07/10/2023 CLINICAL DATA:  Status post bronchoscopy. EXAM: PORTABLE CHEST 1 VIEW COMPARISON:  Chest x-ray 07/09/2023.  FINDINGS: Endotracheal tube tip is 4.6 cm above the carina. Right-sided central venous catheter tip ends in the SVC. Diffuse bilateral airspace opacities have not significantly changed. There is no pleural effusion or pneumothorax. The heart is mildly enlarged. The osseous structures are stable. IMPRESSION: 1. Endotracheal tube tip is 4.6 cm above the carina. 2. Stable diffuse bilateral airspace opacities. Electronically Signed   By: Tyron Gallon M.D.   On:  07/10/2023 18:50   DG Abd 1 View Result Date: 07/09/2023 CLINICAL DATA:  Abdominal distension, pain EXAM: ABDOMEN - 1 VIEW COMPARISON:  None Available. FINDINGS: Nonobstructive bowel gas pattern. No organomegaly or free air. Severe diffuse bilateral airspace disease noted. IMPRESSION: No evidence of bowel obstruction or free air. Electronically Signed   By: Janeece Mechanic M.D.   On: 07/09/2023 21:02   DG CHEST PORT 1 VIEW Result Date: 07/09/2023 CLINICAL DATA:  Hypoxia EXAM: PORTABLE CHEST 1 VIEW COMPARISON:  07/06/2023 FINDINGS: Severe diffuse bilateral airspace disease, worsening since prior study. Heart borderline in size. Mediastinal contours within normal limits. No visible effusions. No acute bony abnormality. IMPRESSION: Worsening severe diffuse bilateral airspace disease. Electronically Signed   By: Janeece Mechanic M.D.   On: 07/09/2023 21:02   DG Abd 1 View Result Date: 07/08/2023 CLINICAL DATA:  Follow-up ileus EXAM: ABDOMEN - 1 VIEW COMPARISON:  None Available. FINDINGS: Administered contrast now lies within the colon. No obstructive changes are seen. No free air is noted. IMPRESSION: Administered contrast now lies within the colon. No obstructive changes are noted. Electronically Signed   By: Violeta Grey M.D.   On: 07/08/2023 23:59   DG Chest 1 View Result Date: 07/07/2023 CLINICAL DATA:  Respiratory failure EXAM: CHEST  1 VIEW COMPARISON:  5:47 p.m. FINDINGS: Endotracheal tube is seen 4.3 cm above the carina. Nasogastric tube extends into  the upper abdomen beyond the margin of the examination. Extensive, diffuse interstitial and airspace infiltrate persists, likely infectious or inflammatory, stable since prior examination. No pneumothorax or pleural effusion. Cardiac size within normal limits. No acute bone abnormality. IMPRESSION: 1. Support tubes in appropriate position. 2. Stable extensive, diffuse interstitial and airspace infiltrate. Electronically Signed   By: Worthy Heads M.D.   On: 07/07/2023 00:05   DG Chest Port 1 View Result Date: 07/06/2023 CLINICAL DATA:  [id number] Endotracheally intubated [id number] EXAM: PORTABLE CHEST 1 VIEW COMPARISON:  X-ray chest 07/06/2023 6:53 p.m., CT chest 07/02/2023. FINDINGS: Endotracheal tube terminates 4 cm above the carina. Enteric tube courses below the hemidiaphragm with side port overlying the expected region the gastric lumen and tip collimated off view. Other lines and tubes overlie the patient. The heart and mediastinal contours are unchanged. Atherosclerotic plaque. No focal consolidation. Stable diffuse patchy airspace and interstitial opacities. No pleural effusion. No pneumothorax. No acute osseous abnormality. IMPRESSION: 1. Lines and tubes appropriate position as above. 2. Stablediffuse patchy airspace and interstitial opacities. 3.  Aortic Atherosclerosis (ICD10-I70.0). Electronically Signed   By: Morgane  Naveau M.D.   On: 07/06/2023 22:54   DG Chest 1 View Result Date: 07/06/2023 CLINICAL DATA:  Follow-up exam EXAM: CHEST  1 VIEW COMPARISON:  Chest x-ray 07/05/2023.  Chest CT 07/02/2023. FINDINGS: Diffuse reticulonodular opacities have not significantly changed from prior. Costophrenic angles are clear. The cardiomediastinal silhouette is within normal limits. No acute fractures are seen. IMPRESSION: Stable diffuse reticulonodular opacities. No new focal lung infiltrate. Electronically Signed   By: Tyron Gallon M.D.   On: 07/06/2023 18:53   DG CHEST PORT 1 VIEW Result Date:  07/05/2023 CLINICAL DATA:  Shortness of breath. EXAM: PORTABLE CHEST 1 VIEW COMPARISON:  July 02, 2023. FINDINGS: Stable cardiomediastinal silhouette. Stable bilateral patchy opacities are noted consistent with multifocal pneumonia. Bony thorax is unremarkable. IMPRESSION: Stable bilateral lung opacities as noted above. Electronically Signed   By: Rosalene Colon M.D.   On: 07/05/2023 12:57   CT CHEST ABDOMEN PELVIS WO CONTRAST Result Date: 07/02/2023 CLINICAL DATA:  Abdomen  pain nausea vomiting weakness EXAM: CT CHEST, ABDOMEN AND PELVIS WITHOUT CONTRAST TECHNIQUE: Multidetector CT imaging of the chest, abdomen and pelvis was performed following the standard protocol without IV contrast. RADIATION DOSE REDUCTION: This exam was performed according to the departmental dose-optimization program which includes automated exposure control, adjustment of the mA and/or kV according to patient size and/or use of iterative reconstruction technique. COMPARISON:  CT 05/26/2023, chest x-ray 07/02/2023 FINDINGS: CT CHEST FINDINGS Cardiovascular: Limited without intravenous contrast. Mild aortic atherosclerosis. No aneurysm. Normal cardiac size. Coronary vascular calcification. Mitral calcification. No pericardial effusion Mediastinum/Nodes: Patent trachea. No thyroid mass. No suspicious lymph nodes. Esophagus within normal limits Lungs/Pleura: Widespread bilateral heterogeneous consolidations and ground-glass disease. No pleural effusion Musculoskeletal: No acute osseous abnormality CT ABDOMEN PELVIS FINDINGS Hepatobiliary: Distended gallbladder. No calcified stones or biliary dilatation Pancreas: Unremarkable. No pancreatic ductal dilatation or surrounding inflammatory changes. Spleen: Normal in size without focal abnormality. Adrenals/Urinary Tract: Adrenal glands are normal. Kidneys show no hydronephrosis. Mild atrophy of the left kidney compared to the right. The bladder is normal Stomach/Bowel: Stomach is within normal  limits. No evidence of bowel wall thickening, distention, or inflammatory changes. Vascular/Lymphatic: Aortic atherosclerosis. No enlarged abdominal or pelvic lymph nodes. Reproductive: Uterus and bilateral adnexa are unremarkable. Other: Negative for pelvic effusion or free air Musculoskeletal: No acute or suspicious osseous abnormality. IMPRESSION: 1. Widespread bilateral heterogeneous consolidations and ground-glass disease consistent with multifocal pneumonia. 2. No CT evidence for acute intra-abdominal or pelvic abnormality. 3. Distended gallbladder 4. Aortic atherosclerosis. Electronically Signed   By: Esmeralda Hedge M.D.   On: 07/02/2023 23:46   ECHOCARDIOGRAM COMPLETE Result Date: 07/02/2023    ECHOCARDIOGRAM REPORT   Patient Name:   LINDEY RENZULLI Date of Exam: 07/02/2023 Medical Rec #:  [id number]    Height:       62.0 in Accession #:    [id number]   Weight:       183.0 lb Date of Birth:  11/22/1950    BSA:          1.841 m Patient Age:    72 years     BP:           113/40 mmHg Patient Gender: F            HR:           87 bpm. Exam Location:  [facility] Procedure: 2D Echo, Cardiac Doppler and Color Doppler (Both Spectral and Color            Flow Doppler were utilized during procedure). Indications:    Dyspnea R06.00  History:        Patient has no prior history of Echocardiogram examinations.                 Risk Factors:Dyslipidemia and Prediabetes. GERD                 (gastroesophageal reflux disease) (From Hx).  Sonographer:    Denese Finn RCS Referring Phys: [phone] [employer]AGE [employer]MOKPAE IMPRESSIONS  1. Left ventricular ejection fraction, by estimation, is 65 to 70%. The left ventricle has normal function. The left ventricle has no regional wall motion abnormalities. There is mild left ventricular hypertrophy. Left ventricular diastolic parameters are consistent with Grade I diastolic dysfunction (impaired relaxation).  2. Right ventricular systolic function is low normal. The right ventricular size is  moderately enlarged. There is mildly elevated pulmonary artery systolic pressure.  3. The mitral valve is normal in structure. Trivial mitral valve regurgitation. No  evidence of mitral stenosis.  4. The tricuspid valve is abnormal.  5. The aortic valve is tricuspid. There is moderate calcification of the aortic valve. There is moderate thickening of the aortic valve. Aortic valve regurgitation is mild. Mild aortic valve stenosis. Aortic valve area, by VTI measures 1.68 cm. Aortic valve mean gradient measures 10.0 mmHg.  6. The inferior vena cava is dilated in size with >50% respiratory variability, suggesting right atrial pressure of 8 mmHg. FINDINGS  Left Ventricle: Left ventricular ejection fraction, by estimation, is 65 to 70%. The left ventricle has normal function. The left ventricle has no regional wall motion abnormalities. The left ventricular internal cavity size was normal in size. There is  mild left ventricular hypertrophy. Left ventricular diastolic parameters are consistent with Grade I diastolic dysfunction (impaired relaxation). Normal left ventricular filling pressure. Right Ventricle: The right ventricular size is moderately enlarged. Right vetricular wall thickness was not well visualized. Right ventricular systolic function is low normal. There is mildly elevated pulmonary artery systolic pressure. The tricuspid regurgitant velocity is 2.76 m/s, and with an assumed right atrial pressure of 8 mmHg, the estimated right ventricular systolic pressure is 38.5 mmHg. Left Atrium: Left atrial size was normal in size. Right Atrium: Right atrial size was normal in size. Pericardium: There is no evidence of pericardial effusion. Mitral Valve: The mitral valve is normal in structure. There is mild thickening of the mitral valve leaflet(s). There is mild calcification of the mitral valve leaflet(s). Mild mitral annular calcification. Trivial mitral valve regurgitation. No evidence  of mitral valve stenosis.  MV peak gradient, 10.9 mmHg. The mean mitral valve gradient is 3.0 mmHg. Tricuspid Valve: The tricuspid valve is abnormal. Tricuspid valve regurgitation is mild . No evidence of tricuspid stenosis. Aortic Valve: The aortic valve is tricuspid. There is moderate calcification of the aortic valve. There is moderate thickening of the aortic valve. There is moderate aortic valve annular calcification. Aortic valve regurgitation is mild. Aortic regurgitation PHT measures 602 msec. Mild aortic stenosis is present. Aortic valve mean gradient measures 10.0 mmHg. Aortic valve peak gradient measures 21.6 mmHg. Aortic valve area, by VTI measures 1.68 cm. Pulmonic Valve: The pulmonic valve was not well visualized. Pulmonic valve regurgitation is mild. No evidence of pulmonic stenosis. Aorta: The aortic root is normal in size and structure. Venous: The inferior vena cava is dilated in size with greater than 50% respiratory variability, suggesting right atrial pressure of 8 mmHg. IAS/Shunts: No atrial level shunt detected by color flow Doppler.  LEFT VENTRICLE PLAX 2D LVIDd:         3.80 cm   Diastology LVIDs:         1.80 cm   LV e' medial:    7.78 cm/s LV PW:         1.10 cm   LV E/e' medial:  14.7 LV IVS:        1.20 cm   LV e' lateral:   9.57 cm/s LVOT diam:     1.90 cm   LV E/e' lateral: 11.9 LV SV:         69 LV SV Index:   37 LVOT Area:     2.84 cm  RIGHT VENTRICLE RV S prime:     11.90 cm/s TAPSE (M-mode): 1.9 cm LEFT ATRIUM             Index        RIGHT ATRIUM           Index LA  diam:        3.20 cm 1.74 cm/m   RA Area:     15.60 cm LA Vol (A2C):   44.4 ml 24.12 ml/m  RA Volume:   40.20 ml  21.84 ml/m LA Vol (A4C):   40.5 ml 22.00 ml/m LA Biplane Vol: 43.9 ml 23.85 ml/m  AORTIC VALVE AV Area (Vmax):    1.78 cm AV Area (Vmean):   1.80 cm AV Area (VTI):     1.68 cm AV Vmax:           232.50 cm/s AV Vmean:          139.500 cm/s AV VTI:            0.408 m AV Peak Grad:      21.6 mmHg AV Mean Grad:      10.0 mmHg  LVOT Vmax:         146.00 cm/s LVOT Vmean:        88.500 cm/s LVOT VTI:          0.242 m LVOT/AV VTI ratio: 0.59 AI PHT:            602 msec  AORTA Ao Root diam: 3.70 cm MITRAL VALVE                TRICUSPID VALVE MV Area (PHT): 2.07 cm     TR Peak grad:   30.5 mmHg MV Area VTI:   1.47 cm     TR Vmax:        276.00 cm/s MV Peak grad:  10.9 mmHg MV Mean grad:  3.0 mmHg     SHUNTS MV Vmax:       1.65 m/s     Systemic VTI:  0.24 m MV Vmean:      70.9 cm/s    Systemic Diam: 1.90 cm MV Decel Time: 366 msec MV E velocity: 114.00 cm/s MV A velocity: 168.00 cm/s MV E/A ratio:  0.68 Armida Lander MD Electronically signed by Armida Lander MD Signature Date/Time: 07/02/2023/5:06:59 PM    Final    US  Venous Img Lower Bilateral (DVT) Result Date: 07/02/2023 CLINICAL DATA:  Weakness, nausea, vomiting and hypoxia. EXAM: BILATERAL LOWER EXTREMITY VENOUS DOPPLER ULTRASOUND TECHNIQUE: Gray-scale sonography with graded compression, as well as color Doppler and duplex ultrasound were performed to evaluate the lower extremity deep venous systems from the level of the common femoral vein and including the common femoral, femoral, profunda femoral, popliteal and calf veins including the posterior tibial, peroneal and gastrocnemius veins when visible. The superficial great saphenous vein was also interrogated. Spectral Doppler was utilized to evaluate flow at rest and with distal augmentation maneuvers in the common femoral, femoral and popliteal veins. COMPARISON:  None Available. FINDINGS: RIGHT LOWER EXTREMITY Common Femoral Vein: No evidence of thrombus. Normal compressibility, respiratory phasicity and response to augmentation. Saphenofemoral Junction: No evidence of thrombus. Normal compressibility and flow on color Doppler imaging. Profunda Femoral Vein: No evidence of thrombus. Normal compressibility and flow on color Doppler imaging. Femoral Vein: No evidence of thrombus. Normal compressibility, respiratory phasicity and  response to augmentation. Popliteal Vein: No evidence of thrombus. Normal compressibility, respiratory phasicity and response to augmentation. Calf Veins: No evidence of thrombus. Normal compressibility and flow on color Doppler imaging. Superficial Great Saphenous Vein: No evidence of thrombus. Normal compressibility. Venous Reflux:  None. Other Findings: No evidence of superficial thrombophlebitis or abnormal fluid collection. LEFT LOWER EXTREMITY Common Femoral Vein: No evidence of thrombus. Normal compressibility, respiratory phasicity  and response to augmentation. Saphenofemoral Junction: No evidence of thrombus. Normal compressibility and flow on color Doppler imaging. Profunda Femoral Vein: No evidence of thrombus. Normal compressibility and flow on color Doppler imaging. Femoral Vein: No evidence of thrombus. Normal compressibility, respiratory phasicity and response to augmentation. Popliteal Vein: No evidence of thrombus. Normal compressibility, respiratory phasicity and response to augmentation. Calf Veins: No evidence of thrombus. Normal compressibility and flow on color Doppler imaging. Superficial Great Saphenous Vein: No evidence of thrombus. Normal compressibility. Venous Reflux:  None. Other Findings: No evidence of superficial thrombophlebitis or abnormal fluid collection. IMPRESSION: No evidence of deep venous thrombosis in either lower extremity. Electronically Signed   By: Erica Hau M.D.   On: 07/02/2023 16:34   DG Abd 2 Views Result Date: 07/02/2023 CLINICAL DATA:  Abdominal pain and fatigue EXAM: ABDOMEN - 2 VIEW COMPARISON:  CT abdomen and pelvis dated 05/26/2023 FINDINGS: Paucity of bowel gas. Left flank is not entirely included within the field of view. No free air or pneumatosis. No abnormal radio-opaque calculi or mass effect. No acute or substantial osseous abnormality. The sacrum and coccyx are partially obscured by overlying bowel contents. IMPRESSION: Paucity of bowel gas. No  free air or pneumatosis. Electronically Signed   By: Limin  Xu M.D.   On: 07/02/2023 16:02   DG Chest Port 1 View Result Date: 07/02/2023 CLINICAL DATA:  Shortness of breath EXAM: PORTABLE CHEST 1 VIEW COMPARISON:  05/26/2023 FINDINGS: Mild cardiomegaly. Mediastinal contours within normal limits. Patchy bilateral airspace disease, new since prior study. No effusions. No acute bony abnormality. IMPRESSION: Patchy bilateral airspace disease could reflect edema or multifocal pneumonia. Mild cardiomegaly. Electronically Signed   By: Janeece Mechanic M.D.   On: 07/02/2023 14:28    Microbiology Recent Results (from the past 240 hours)  Respiratory (~20 pathogens) panel by PCR     Status: None   Collection Time: 07/06/23  6:22 PM   Specimen: Nasopharyngeal Swab; Respiratory  Result Value Ref Range Status   Adenovirus NOT DETECTED NOT DETECTED Final   Coronavirus 229E NOT DETECTED NOT DETECTED Final    Comment: (NOTE) The Coronavirus on the Respiratory Panel, DOES NOT test for the novel  Coronavirus (2019 nCoV)    Coronavirus HKU1 NOT DETECTED NOT DETECTED Final   Coronavirus NL63 NOT DETECTED NOT DETECTED Final   Coronavirus OC43 NOT DETECTED NOT DETECTED Final   Metapneumovirus NOT DETECTED NOT DETECTED Final   Rhinovirus / Enterovirus NOT DETECTED NOT DETECTED Final   Influenza A NOT DETECTED NOT DETECTED Final   Influenza B NOT DETECTED NOT DETECTED Final   Parainfluenza Virus 1 NOT DETECTED NOT DETECTED Final   Parainfluenza Virus 2 NOT DETECTED NOT DETECTED Final   Parainfluenza Virus 3 NOT DETECTED NOT DETECTED Final   Parainfluenza Virus 4 NOT DETECTED NOT DETECTED Final   Respiratory Syncytial Virus NOT DETECTED NOT DETECTED Final   Bordetella pertussis NOT DETECTED NOT DETECTED Final   Bordetella Parapertussis NOT DETECTED NOT DETECTED Final   Chlamydophila pneumoniae NOT DETECTED NOT DETECTED Final   Mycoplasma pneumoniae NOT DETECTED NOT DETECTED Final    Comment: Performed at [facility], [address] [place], [place] [address]  SARS Coronavirus 2 by RT PCR (hospital order, performed in [facility] hospital lab) *cepheid single result test* Anterior Nasal Swab     Status: None   Collection Time: 07/06/23  6:23 PM   Specimen: Anterior Nasal Swab  Result Value Ref Range Status   SARS Coronavirus 2  by RT PCR NEGATIVE NEGATIVE Final    Comment: (NOTE) SARS-CoV-2 target nucleic acids are NOT DETECTED.  The SARS-CoV-2 RNA is generally detectable in upper and lower respiratory specimens during the acute phase of infection. The lowest concentration of SARS-CoV-2 viral copies this assay can detect is 250 copies / mL. A negative result does not preclude SARS-CoV-2 infection and should not be used as the sole basis for treatment or other patient management decisions.  A negative result may occur with improper specimen collection / handling, submission of specimen other than nasopharyngeal swab, presence of viral mutation(s) within the areas targeted by this assay, and inadequate number of viral copies (<250 copies / mL). A negative result must be combined with clinical observations, patient history, and epidemiological information.  Fact Sheet for Patients:   RoadLapTop.co.za  Fact Sheet for Healthcare Providers: http://kim-miller.com/  This test is not yet approved or  cleared by the [place]  FDA and has been authorized for detection and/or diagnosis of SARS-CoV-2 by FDA under an Emergency Use Authorization (EUA).  This EUA will remain in effect (meaning this test can be used) for the duration of the COVID-19 declaration under Section 564(b)(1) of the Act, 21 U.S.C. section 360bbb-3(b)(1), unless the authorization is terminated or revoked sooner.  Performed at [facility], [address] [place], [place] [address]   MRSA Next Gen by PCR, Nasal     Status: None   Collection Time: 07/06/23  9:26 PM    Specimen: Nasal Mucosa; Nasal Swab  Result Value Ref Range Status   MRSA by PCR Next Gen NOT DETECTED NOT DETECTED Final    Comment: (NOTE) The GeneXpert MRSA Assay (FDA approved for NASAL specimens only), is one component of a comprehensive MRSA colonization surveillance program. It is not intended to diagnose MRSA infection nor to guide or monitor treatment for MRSA infections. Test performance is not FDA approved in patients less than 28 years old. Performed at [facility], [address] [place], [place] [address]   Culture, Respiratory w Gram Stain     Status: None   Collection Time: 07/07/23  7:04 AM   Specimen: Tracheal Aspirate; Respiratory  Result Value Ref Range Status   Specimen Description TRACHEAL ASPIRATE  Final   Special Requests NONE  Final   Gram Stain   Final    RARE WBC SEEN RARE YEAST Performed at [facility], [address], [place], [place] [address]    Culture FEW CANDIDA ALBICANS FEW CANDIDA TROPICALIS   Final   Report Status 07/10/2023 FINAL  Final  Culture, blood (Routine X 2) w Reflex to ID Panel     Status: None (Preliminary result)   Collection Time: 07/11/23  8:40 AM   Specimen: BLOOD LEFT ARM  Result Value Ref Range Status   Specimen Description BLOOD LEFT ARM  Final   Special Requests   Final    BOTTLES DRAWN AEROBIC AND ANAEROBIC Blood Culture adequate volume   Culture   Final    NO GROWTH 2 DAYS Performed at [facility], [address], [place], [place] [address]    Report Status PENDING  Incomplete  Culture, blood (Routine X 2) w Reflex to ID Panel     Status: None (Preliminary result)   Collection Time: 07/11/23  8:46 AM   Specimen: BLOOD LEFT HAND  Result Value Ref Range Status   Specimen Description BLOOD LEFT HAND  Final   Special Requests   Final  BOTTLES DRAWN AEROBIC AND ANAEROBIC Blood Culture adequate volume   Culture   Final    NO GROWTH 2 DAYS Performed at [facility], [address],  [place], [place] [address]    Report Status PENDING  Incomplete    Lab Basic Metabolic Panel: Recent Labs  Lab 07/11/23 1803 07/12/23 0500 07/12/23 1607 07/13/23 0510 07/13/23 1616 2023-08-13 0250  NA 136 137 138 137 139 139  K 4.1 4.3 4.4 4.8 5.3* 6.0*  CL 96* 102 100 97* 99 99  CO2 22 24 25 25 23  21*  GLUCOSE 119* 133* 149* 158* 146* 126*  BUN 77* 50* 42* 33* 56* 39*  CREATININE 2.94* 1.92* 1.46* 1.40* 2.17* 1.66*  CALCIUM  8.5* 8.6* 8.5* 9.1 8.8* 9.6  MG 2.3 2.6* 2.8* 2.9*  --  3.3*  PHOS 6.0*  6.0* 4.1 3.1  3.1 3.2 5.3* 6.4*   Liver Function Tests: Recent Labs  Lab 07/08/23 0816 07/09/23 0849 07/11/23 0301 07/11/23 0840 07/11/23 1803 07/12/23 0500 07/12/23 1607 07/13/23 0510 07/13/23 1616 2023-08-13 0250  AST 31 37  --  27  --   --   --  34  --   --   ALT 31 28  --  20  --   --   --  20  --   --   ALKPHOS 297* 306*  --  206*  --   --   --  241*  --   --   BILITOT 2.7* 2.1*  --  2.5*  --   --   --  1.7*  --   --   PROT 6.8 6.5  --  6.3*  --   --   --  8.1  --   --   ALBUMIN  1.7* 1.8*   < > 1.8*   < > 1.8* 1.8* 2.1*  2.1* 2.0* 2.2*   < > = values in this interval not displayed.   No results for input(s): "LIPASE", "AMYLASE" in the last 168 hours. Recent Labs  Lab 07/13/23 1616  AMMONIA 25   CBC: Recent Labs  Lab 07/10/23 0228 07/10/23 1528 07/10/23 2351 07/11/23 0301 07/12/23 0500 07/13/23 0510 08/13/2023 0250  WBC 14.6*  --   --  21.3* 23.7* 28.4* 35.7*  HGB 8.3*   < > 10.5* 8.0* 7.5* 8.5* 8.6*  HCT 25.6*   < > 31.0* 24.2* 22.8* 26.9* 28.7*  MCV 89.5  --   --  87.4 88.0 91.2 96.3  PLT 303  --   --  393 425* 581* 649*   < > = values in this interval not displayed.   Cardiac Enzymes: No results for input(s): "CKTOTAL", "CKMB", "CKMBINDEX", "TROPONINI" in the last 168 hours. Sepsis Labs: Recent Labs  Lab 07/11/23 0301 07/11/23 0840 07/11/23 1122 07/12/23 0500 07/13/23 0510 07/13/23 1339 07/13/23 1616 Aug 13, 2023 0250  WBC 21.3*  --   --  23.7*  28.4*  --   --  35.7*  LATICACIDVEN  --  1.6 1.2  --   --  1.5 1.1  --     Procedures/Operations  HD catheter placement CVC placement intubation   Joesph Mussel 08-13-2023, 7:09 AM

## 2023-07-26 DEATH — deceased

## 2023-08-07 ENCOUNTER — Other Ambulatory Visit: Payer: Self-pay | Admitting: Family

## 2023-08-07 DIAGNOSIS — M159 Polyosteoarthritis, unspecified: Secondary | ICD-10-CM

## 2024-01-14 ENCOUNTER — Ambulatory Visit: Payer: Medicare PPO | Admitting: Urology
